# Patient Record
Sex: Male | Born: 1940 | Race: White | Hispanic: No | Marital: Married | Smoking: Never smoker
Health system: Southern US, Community
[De-identification: ages and names within clinical notes are randomized; demographics above are authoritative.]

## PROBLEM LIST (undated history)

## (undated) DIAGNOSIS — I2510 Atherosclerotic heart disease of native coronary artery without angina pectoris: Secondary | ICD-10-CM

## (undated) DIAGNOSIS — E785 Hyperlipidemia, unspecified: Secondary | ICD-10-CM

## (undated) DIAGNOSIS — N183 Chronic kidney disease, stage 3 unspecified: Secondary | ICD-10-CM

## (undated) DIAGNOSIS — I340 Nonrheumatic mitral (valve) insufficiency: Secondary | ICD-10-CM

## (undated) DIAGNOSIS — I351 Nonrheumatic aortic (valve) insufficiency: Secondary | ICD-10-CM

## (undated) DIAGNOSIS — J309 Allergic rhinitis, unspecified: Secondary | ICD-10-CM

## (undated) DIAGNOSIS — D126 Benign neoplasm of colon, unspecified: Secondary | ICD-10-CM

## (undated) DIAGNOSIS — N200 Calculus of kidney: Secondary | ICD-10-CM

## (undated) DIAGNOSIS — I341 Nonrheumatic mitral (valve) prolapse: Secondary | ICD-10-CM

## (undated) DIAGNOSIS — K219 Gastro-esophageal reflux disease without esophagitis: Secondary | ICD-10-CM

## (undated) DIAGNOSIS — M1990 Unspecified osteoarthritis, unspecified site: Secondary | ICD-10-CM

## (undated) DIAGNOSIS — N400 Enlarged prostate without lower urinary tract symptoms: Secondary | ICD-10-CM

## (undated) DIAGNOSIS — M722 Plantar fascial fibromatosis: Secondary | ICD-10-CM

## (undated) DIAGNOSIS — I10 Essential (primary) hypertension: Secondary | ICD-10-CM

## (undated) DIAGNOSIS — G709 Myoneural disorder, unspecified: Secondary | ICD-10-CM

## (undated) DIAGNOSIS — N210 Calculus in bladder: Secondary | ICD-10-CM

## (undated) DIAGNOSIS — M109 Gout, unspecified: Secondary | ICD-10-CM

## (undated) DIAGNOSIS — E039 Hypothyroidism, unspecified: Secondary | ICD-10-CM

## (undated) DIAGNOSIS — Z87442 Personal history of urinary calculi: Secondary | ICD-10-CM

## (undated) DIAGNOSIS — N529 Male erectile dysfunction, unspecified: Secondary | ICD-10-CM

## (undated) DIAGNOSIS — N289 Disorder of kidney and ureter, unspecified: Secondary | ICD-10-CM

## (undated) HISTORY — DX: Atherosclerotic heart disease of native coronary artery without angina pectoris: I25.10

## (undated) HISTORY — DX: Male erectile dysfunction, unspecified: N52.9

## (undated) HISTORY — PX: OTHER SURGICAL HISTORY: SHX169

## (undated) HISTORY — DX: Gastro-esophageal reflux disease without esophagitis: K21.9

## (undated) HISTORY — DX: Benign prostatic hyperplasia without lower urinary tract symptoms: N40.0

## (undated) HISTORY — PX: CORONARY ARTERY BYPASS GRAFT: SHX141

## (undated) HISTORY — DX: Essential (primary) hypertension: I10

## (undated) HISTORY — DX: Allergic rhinitis, unspecified: J30.9

## (undated) HISTORY — DX: Benign neoplasm of colon, unspecified: D12.6

## (undated) HISTORY — DX: Chronic kidney disease, stage 3 unspecified: N18.30

## (undated) HISTORY — DX: Hypothyroidism, unspecified: E03.9

## (undated) HISTORY — DX: Nonrheumatic mitral (valve) insufficiency: I34.0

## (undated) HISTORY — DX: Nonrheumatic aortic (valve) insufficiency: I35.1

## (undated) HISTORY — DX: Nonrheumatic mitral (valve) prolapse: I34.1

## (undated) HISTORY — DX: Hyperlipidemia, unspecified: E78.5

## (undated) HISTORY — DX: Plantar fascial fibromatosis: M72.2

---
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
## 1999-02-09 ENCOUNTER — Observation Stay (HOSPITAL_COMMUNITY): Admission: RE | Admit: 1999-02-09 | Discharge: 1999-02-10 | Payer: Self-pay | Admitting: Orthopedic Surgery

## 2004-08-10 ENCOUNTER — Inpatient Hospital Stay (HOSPITAL_COMMUNITY): Admission: AD | Admit: 2004-08-10 | Discharge: 2004-08-15 | Payer: Self-pay | Admitting: Cardiology

## 2004-09-04 ENCOUNTER — Encounter (HOSPITAL_COMMUNITY): Admission: RE | Admit: 2004-09-04 | Discharge: 2004-12-03 | Payer: Self-pay | Admitting: Cardiology

## 2004-12-04 ENCOUNTER — Encounter (HOSPITAL_COMMUNITY): Admission: RE | Admit: 2004-12-04 | Discharge: 2005-03-04 | Payer: Self-pay | Admitting: Cardiology

## 2005-09-28 ENCOUNTER — Ambulatory Visit (HOSPITAL_COMMUNITY): Admission: RE | Admit: 2005-09-28 | Discharge: 2005-09-28 | Payer: Self-pay | Admitting: Internal Medicine

## 2007-04-22 ENCOUNTER — Ambulatory Visit (HOSPITAL_COMMUNITY): Admission: RE | Admit: 2007-04-22 | Discharge: 2007-04-22 | Payer: Self-pay | Admitting: Cardiology

## 2008-02-26 ENCOUNTER — Ambulatory Visit: Payer: Self-pay | Admitting: Gastroenterology

## 2008-03-15 ENCOUNTER — Ambulatory Visit: Payer: Self-pay | Admitting: Gastroenterology

## 2011-03-01 LAB — HEMOCCULT GUIAC POC 1CARD (OFFICE)

## 2011-04-24 NOTE — H&P (Signed)
NAMEWILEY, MAGAN               ACCOUNT NO.:  [id number]   MEDICAL RECORD NO.:  [id number]            PATIENT TYPE:   LOCATION:                                 FACILITY:   PHYSICIAN:  Colleen Can. Deborah Chalk, M.D.    DATE OF BIRTH:   DATE OF ADMISSION:  04/22/2007  DATE OF DISCHARGE:                              HISTORY & PHYSICAL   CHIEF COMPLAINT:  None.   HISTORY OF PRESENT ILLNESS:  Mr. William Pierce is a very pleasant 70 year old  white male who has a known history of ischemic heart disease.  He was  seen after an approximate year and a half absence towards the middle  part of April.  At that time he had no complaints and he was referred  for a follow-up stress Cardiolite study.  Stress Cardiolite was  performed on Apr 18, 2007.  With this he exercised to the beginning of  stage IV.  His heart rate rose to approximately 146.  He did have PVCs  that became more frequent as well as subsequently having short runs of  ventricular tachycardia at the peak of exercise test.  The test was  actually stopped due to arrhythmia.  Electrocardiographically he had  inferior ST depression.  Clinically he remained asymptomatic.  He is now  referred for cardiac catheterization.   PAST MEDICAL HISTORY:  1. Known atherosclerotic cardiovascular disease.  He has had a      previous history of coronary artery bypass grafting x3 with left      internal mammary to the LAD, saphenous vein graft to the      intermediate, and saphenous vein graft to the posterior descending      on August 10, 2004, per Dr. Sheliah Plane.  2. Hyperlipidemia.  3. Erectile dysfunction.  4. GERD.  5. Plantar fasciitis  6. Positive family history of coronary disease.   ALLERGIES:  None.   CURRENT MEDICATIONS:  1. Toprol 50 mg b.i.d.  2. Zetia 10 mg a day.  3. Crestor 10 mg a day.  4. Multivitamin daily.  5. Fish oil daily.  6. Prilosec over-the-counter on Mondays, Wednesdays and Fridays.  7. Viagra p.r.n.   FAMILY  HISTORY:  Positive for coronary disease in both his father as  well as a stroke in his mother.   SOCIAL HISTORY:  He is married.  He is retired from [employer].  He has  no tobacco or alcohol.  He has four grown children.   REVIEW OF SYSTEMS:  As noted above.  He has had no complaints of chest  pain.  He has had no lightheadedness or dizziness.  He does note an  occasional skip in his heart on occasion.  He has not been playing  tennis due to plantar fasciitis as well as a heel spur.  His weight has  increased over the course the past year and a half.  He has not had  shortness of breath.   PHYSICAL EXAMINATION:  GENERAL:  On exam, he is a pleasant white male  who appears younger than his stated age.  VITAL  SIGNS:  Weight is 182-1/2, blood pressure 126/78 sitting, 110/70  standing, heart rate 60 and regular, respirations 18.  He is afebrile.  SKIN:  Warm and dry.  Color is unremarkable.  LUNGS: Clear.  HEART:  Regular rhythm.  ABDOMEN:  Soft.  EXTREMITIES:  Without edema.  NEUROLOGIC:  No gross focal deficits.   Pertinent labs are pending.   OVERALL IMPRESSION:  1. Abnormal stress Cardiolite study as evidenced by EKG changes and      runs of ventricular tachycardia.  2. Known ischemic heart disease with previous coronary artery bypass      grafting in 2005.  3. Hyperlipidemia.   PLAN:  We will proceed on with cardiac catheterization.  The procedure  risks and benefits have all been reviewed and he is willing to proceed  on Tuesday Apr 22, 2007.      Sharlee Blew, N.P.      Colleen Can. Deborah Chalk, M.D.  Electronically Signed    LC/MEDQ  D:  04/18/2007  T:  04/18/2007  Job:  [id number]

## 2011-04-24 NOTE — Cardiovascular Report (Signed)
NAMEWILFRID, William Pierce              ACCOUNT NO.:  [id number]   MEDICAL RECORD NO.:  [id number]          PATIENT TYPE:  OIB   LOCATION:  2899                         FACILITY:  [facility]   PHYSICIAN:  Colleen Can. Deborah Chalk, M.D.DATE OF BIRTH:  March 24, 1941   DATE OF PROCEDURE:  04/22/2007  DATE OF DISCHARGE:                            CARDIAC CATHETERIZATION   PROCEDURE:  Left heart catheterization with selective coronary  angiography, left ventricular angiography, saphenous vein graft  angiography x2 and angiography of left internal mammary artery.   TYPE AND SITE:  __________  percutaneous right femoral artery __________  catheter; 6-French 4 curved Judkins right and left coronary catheters, a  6-French pigtail ventriculographic catheter, a left coronary artery  bypass graft catheter, left internal mammary catheter.   CONTRAST MATERIAL:  Omnipaque.   MEDICATIONS GIVEN PRIOR TO THE PROCEDURE:  Valium 10 mg p.o.   MEDICATIONS GIVEN DURING THE PROCEDURE:  Versed 3 mg IV.   COMMENTS:  The patient tolerated the procedure well.   HEMODYNAMIC DATA:  The aortic pressure was 105/70.  LV was 123/2-6.  There was no aortic valve gradient noted on pullback.   ANGIOGRAPHIC DATA:  Left ventricular angiogram was performed in the RAO  position.  Overall cardiac size and silhouette were normal.  There was  mild anterior hypokinesis.  The global ejection fraction was estimated  at 55% with very minimal anterior hypokinesia.  1. Left main coronary artery had 60-70% ostial narrowing.  2. The left circumflex is a moderate-sized vessel.  It had 30%      proximal narrowing.  3. Left anterior descending:  The left anterior descending had 60-70%      narrowing before the insertion of the left internal mammary graft.      There is bidirectional flow distally.  The second diagonal vessel      was a relatively large vessel and satisfactory.  4. Right coronary artery:  The right coronary artery had a 40% area  in      the mid portion and a 60-70% lesion sequential to that in the mid      portion before the crux.  There is bidirectional flow distally.  5. Left internal mammary graft to the LAD was widely patent with nice      insertion and good distal runoff.  6. Saphenous vein graft to the right coronary artery distally had a      nice insertion and good distal runoff.  7. Saphenous vein graft to the intermediate was a satisfactory graft.      No bidirectional flow was noted into this vessel.   OVERALL IMPRESSION:  1. Well-preserved left ventricular function with very minimal anterior      hypokinesis.  2. Three-vessel coronary atherosclerosis with 60-70% right coronary      artery lesion, 60-70% mid left anterior descending, 60-70% ostial      left anterior descending disease and minimal disease in the left      circumflex.  3. Patent left internal mammary artery graft to the left anterior      descending, patent saphenous vein graft to the right  coronary      artery, patent saphenous vein graft to the intermediate coronary.   DISCUSSION:  In light of these findings, we will place [name] on  beta blockers and in hopes to manage this exercise-induced ventricular  tachycardia.  It is felt that he is satisfactorily revascularized.      Colleen Can. Deborah Chalk, M.D.     SNT/MEDQ  D:  04/22/2007  T:  04/22/2007  Job:  [id number]

## 2011-04-27 NOTE — Cardiovascular Report (Signed)
NAMEJAVARUS, Pierce                        ACCOUNT NO.:  [id number]   MEDICAL RECORD NO.:  [id number]                   PATIENT TYPE:  OIB   LOCATION:  2872                                 FACILITY:  [facility]   PHYSICIAN:  Colleen Can. Deborah Chalk, M.D.            DATE OF BIRTH:  [birth year]   DATE OF PROCEDURE:  08/09/2004  DATE OF DISCHARGE:                              CARDIAC CATHETERIZATION   REFERRING PHYSICIAN:  Larina Earthly, M.D.   PROCEDURE:  Left heart catheterization with selective angiography and left  ventricular angiography.   TYPE AND SITE:  Percutaneous right femoral artery with Angiocele.   CATHETER:  A 6 French curved Judkins right and left coronary catheter, a 6  French pigtail ventricular catheter.   CONTRAST MATERIAL:  Omnipaque.   MEDICATION GIVEN PRIOR TO PROCEDURE:  Valium 10 mg p.o.   MEDICATION GIVEN DURING THE PROCEDURE:  Versed 2 mg IV.   COMMENTS:  The patient tolerated the procedure well.  Nitroglycerin was  given after pictures of the left main coronary artery because of the concern  of spasm but it is felt that the left main stenosis was indeed real.   Hemodynamic data:  Aortic pressure 105/85, LV 123/2-6.  There is no aortic  valve gradient noted on pullback.   Angiographic data:  1.  Left main coronary artery has 80% ostial narrowing.  It is approximately      10 mm in length.  2.  Left anterior descending:  Left anterior descending coronary artery has      a 40 to 50% narrowing in his proximal portions.  There was a moderate      size diagonal vessel with irregularities.  There was no significant      obstructive disease in the left anterior descending.  3.  Intermediate coronary:  There is a moderate sized intermediate coronary      with mild irregularities proximally.  There is no significant      atherosclerosis.  4.  Left circumflex:  Left circumflex is a small to moderate size vessel.      There are four small to moderate size distal  branches. There is a 50 to      60% narrowing in the proximal left circumflex prior to dividing into      __________ branches.  5.  Right coronary artery:  The right coronary artery is a relatively large      dominant vessel.  There are scattered irregularities throughout the      proximal one half and up to the carotids.  There is a 70% focal      narrowing at the mid portion at the level of the large acute marginal      vessel.  The acute marginal does have an ostial stenosis.   Left ventricular angiogram:  The left ventricular angiogram is performed in  the RAO position.  Overall cardiac size was normal.  The  global ejection  fraction is estimated to be approximately 50%.  Regional wall motion is felt  to probably be within normal limits, although there may be some mild  anterior hypokinesis.   IMPRESSION:  1.  Essentially normal left ventricular function .  2.  Severe left main stenosis (80% ostial) and moderately severe (70%) right      coronary artery with mild to moderate scattered atherosclerosis in the      left system otherwise.   DISCUSSION:  In light of these findings, we will arrange for William Pierce to  proceed on with coronary artery bypass grafting.                                               Colleen Can. Deborah Chalk, M.D.    SNT/MEDQ  D:  08/09/2004  T:  08/10/2004  Job:  [id number]   cc:   Larina Earthly, M.D.  [address]  [place]  [place] [address]  Fax: [phone]

## 2011-04-27 NOTE — Consult Note (Signed)
William Pierce, William Pierce                        ACCOUNT NO.:  [id number]   MEDICAL RECORD NO.:  [id number]                   PATIENT TYPE:  OIB   LOCATION:  2872                                 FACILITY:  [facility]   PHYSICIAN:  Gwenith Daily. Tyrone Sage, M.D.            DATE OF BIRTH:  May 29, 1941   DATE OF CONSULTATION:  08/09/2004  DATE OF DISCHARGE:                                   CONSULTATION   REFERRING PHYSICIAN:  Colleen Can. Deborah Chalk, M.D.   FOLLOW-UP CARDIOLOGIST:  Colleen Can. Deborah Chalk, M.D.   PRIMARY CARE PHYSICIAN:  Larina Earthly, M.D.   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   HISTORY OF PRESENT ILLNESS:  The patient is a 70 year old male who has been  healthy and active with no previous cardiac history noted, while playing  tennis, increasing shortness of breath according to what he had been used to  in the past and some vague chest discomfort.  He has also had several  episodes of left shoulder pain which were unrelated to exertion.  He noted  these symptoms and discussed them with his primary care doctor, who ordered  an exercise stress test.  The patient walked nine minutes and had 3 mm ST  inferolateral depression and increased ectopy.  ST segments lasted up until  10 minutes into recovery.  Because of the abnormal stress test he was  referred to Dr. Deborah Chalk for a cardiac catheterization.  He has had no prior  myocardial infarction, no prior angioplasty.  He does have hypertension,  does have hyperlipidemia.  He denies diabetes, never smoked.  He has history  of cerebrovascular disease and cardiac disease, runs in his family.  Both  his father and his mother died in their 3s, his father from MI and his  mother from a stroke.  The patient denies any previous stroke, denies  peripheral vascular disease, denies renal insufficiency.  He has no other  major medical problems.   PAST SURGICAL HISTORY:  Esophageal dilatation for Schatzki's  ring.   SOCIAL HISTORY:  The patient is married, works as a Public librarian.  He has  occasional alcohol use.   MEDICATIONS ON ADMISSION:  1.  Fish oil one tablet b.i.d.  2.  Baby aspirin once a day, 81 mg.  3.  Glucosamine.  4.  Altace.  5.  Zetia.  6.  Prevacid.  7.  Recently added Toprol XL.   ALLERGIES:  Drug allergies:  None known.   REVIEW OF SYSTEMS:  CARDIAC:  As noted above.  Chest discomfort and  shortness of breath while playing tennis and increasing fatigue.  He has had  shoulder pain at night, denies resting shortness of breath, denies syncope,  presyncope, orthopnea, palpitations, or lower extremity edema.  GENERAL:  He  denies any constitutional symptoms.  No fever or chills.  RESPIRATORY:  As  noted above.  Denies hemoptysis.  GASTROINTESTINAL:  Has had symptoms of  GERD in the past leading to dilatation of a Schatzki's ring.  NEUROLOGIC:  Denies TIAs or amaurosis.  The patient is right-handed.  MUSCULOSKELETAL:  Denies any current joint pain.  GENITOURINARY:  Denies any urinary  difficulties, does occasionally use Viagra.  HEMATOLOGIC:  No easy  bruisability.  ENDOCRINE:  Denies diabetes.  Denies psychiatric history.  Denies claudication.  Other review of systems negative.   PHYSICAL EXAMINATION:  VITAL SIGNS:  Reveals a blood pressure of 121/79,  pulse 56 and regular, respiratory rate is 18.  O2 saturations on room air  100%.  Height 5 feet 10-1/2 inches tall, weighs 170 pounds.  GENERAL:  The patient is healthy-appearing and neurologically intact and  able to relate his history.  HEENT:  Pupils equal, round, and reactive to light.  NECK:  Without carotid bruits.  LUNGS:  Clear.  CARDIAC:  Reveals regular rate and rhythm without murmur or gallop.  ABDOMEN:  Benign right groin.  Without hematoma.  NEUROLOGIC:  Grossly intact.  Appears to have adequate vein in both lower  extremities for bypass.  EXTREMITIES:  Vascular exam reveals 2+ DP and PT pulses  bilaterally.   LABORATORY DATA:  Findings include a white count of 7400, hematocrit of  44.7, platelet count 292.  Potassium is 4.7, creatinine 1.3.  Ejection  fraction is 50%.  He has osteal 80-90% left main, LAD 40% proximal, with an  intermediate, relatively free of disease.  Circumflex is made up of the  small branch with four distal branches with proximal 50% like coronary  arteries.  Moderate size with diffuse irregularity and approximately mid  70%.   IMPRESSION:  A patient with positive stress test and left main obstruction  and right coronary disease.  With the patient's left main obstruction  greater than 80%, and positive stress test, we agree with the recommendation  from cardiology to proceed with coronary artery bypass grafting.  The risks  and options were discussed with the patient in detail including the risks or  death, infection, stroke, myocardial infarction, bleeding, or blood  transfusion were all discussed with the patient in detail.  He is willing to  proceed.  We will keep the patient overnight and proceed with surgery  tomorrow.                                               Gwenith Daily Tyrone Sage, M.D.    Tyson Babinski  D:  08/09/2004  T:  08/09/2004  Job:  [id number]   cc:   Gwenith Daily. Tyrone Sage, M.D.  [address]  [place]  [place] [address]   Colleen Can. Deborah Chalk, M.D.  Fax: [phone]

## 2011-04-27 NOTE — Op Note (Signed)
William Pierce, William Pierce                        ACCOUNT NO.:  [id number]   MEDICAL RECORD NO.:  [id number]                   PATIENT TYPE:  INP   LOCATION:  2027                                 FACILITY:  [facility]   PHYSICIAN:  Gwenith Daily. Tyrone Sage, M.D.            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   SURGICAL PROCEDURE:  Coronary artery bypass grafting x3 with left internal  mammary to the left anterior descending coronary artery, reverse saphenous  vein graft to the intermediate coronary artery, reverse saphenous vein graft  to the posterior descending coronary artery with endo-vein harvesting.   SURGEON:  Gwenith Daily. Tyrone Sage, M.D.   FIRST ASSISTANT:  Carolyn A. Eustaquio Boyden.   BRIEF HISTORY:  Patient is a 70 year old male who had new onset of vague  chest discomfort with exertion.  Because of these symptoms, a stress test  was performed, which was positive, leading to a cardiac catheterization by  Dr. Delfin Edis.  This revealed greater than 80% ostial left main  obstruction, 50-60% proximal LAD obstruction, a small circumflex system with  four small distal branches, a moderate-sized intermediate and a 60% distal  right coronary artery obstruction.  Because of the patient's significant  left main disease, coronary artery bypass grafting was recommended, and the  patient agreed and signed informed consent.   DESCRIPTION OF PROCEDURE:  With Swan-Ganz arterial line and monitors in  place, the patient underwent general endotracheal anesthesia without  incidence.  The skin of the chest and legs was prepped with Betadine and  draped in the usual sterile manner.  Initially, a small incision was made  over the left knee, and the  saphenous vein was small and not usable.  In a  similar fashion, a small incision was made over the right knee.  The endo-  vein was suitable.  The vein at the right knee was suitable, and using the  Guidant endo-vein harvesting system, a segment of vein was harvested and was  of good quality and caliber.  A median sternotomy was performed.  The left  internal mammary artery was dissected down as a pedicle graft.  The distal  artery was divided and had good free flow.  The pericardium was opened.  Overall ventricular function appeared preserved.  The patient was  systemically heparinized.  The ascending aorta and the right atrium were  cannulated.  The aortic root had undergone cardioplegia, and it was  introduced into the ascending aorta.  The patient was placed on  cardiopulmonary bypass at 2.4 L/min/m2.  Sites for anastomosis were selected  and dissected out of the epicardium.  The patient's body temperature was  cooled to 30 degrees.  The aortic cross clamp was __________  500 cc of cold  blood potassium cardioplegia was administered with rapid diastolic  __________  cross clamp.  Attention was turned first to the circumflex  branches.  These were very small and were not felt to be large enough to  bypass as they arose from the AV groove.  The intermediate coronary artery  was a suitable size and was opened and underwent a 1.5 mm probe.  The vessel  was partially intramyocardial.  Using a running 7-0 Prolene, distal  anastomosis was performed.  Attention was then turned to the posterior  descending coronary artery, which was opened and admitted a 1.5 mm probe.  Using a running 7-0 Prolene, distal anastomosis was then performed.  Attention was then turned to the left anterior descending coronary artery,  which was opened in the distal third of the vessel.  Proximal 2/3 was  entered myocardial.  Using a running 8-0 Prolene, the left internal mammary  artery was anastomosed to the left  anterior descending artery with release  of the [employer] with prompt rise in myocardial septal temperature.  The bulldog was placed back on the mammary artery.  With the cross clamp  still in place, two punch aortotomies were performed, each of the two vein  graft anastomoses to the ascending aorta.  Air was evacuated from the heart,  and the grafts completed.  A bulldog was removed with prompt rise in  myocardial septal temperature.  Aortic cross clamp was removed.  Total cross  clamp time was 60 minutes.  The patient's body temperature was rewarmed to  37 degrees.  Remained hemodynamically stable.  Was decannulated in the usual  fashion.  Protamine sulfate was administered within the operative field.  The total pump time was 87 minutes.  Cross clamp time was 60 minutes.  Within the operative field, hemostatic __________ .  A left pleural tube and  two mediastinal tubes were left in place.  The sternum was closed with a #6  stainless steel wire.  Fascia was closed with interrupted 0 Vicryl, a  running 3-0 Vicryl and subcutaneous tissue 4-0 subcuticular stitching skin  edges.  A dry sterile dressing was applied.  Sponge and needle count was  reported as correct at the completion of the procedure.  Patient tolerated  the procedure without obvious complication.  Was transferred to the surgical  intensive care unit for further postoperative care.                                               Gwenith Daily Tyrone Sage, M.D.    EBG/MEDQ  D:  08/15/2004  T:  08/15/2004  Job:  [id number]   cc:   Colleen Can. Deborah Chalk, M.D.  Fax: [phone]

## 2011-04-27 NOTE — Discharge Summary (Signed)
William Pierce, William Pierce              ACCOUNT NO.:  [id number]   MEDICAL RECORD NO.:  [id number]          PATIENT TYPE:  INP   LOCATION:  2027                         FACILITY:  [facility]   PHYSICIAN:  Sheliah Plane, MD    DATE OF BIRTH:  1941-10-22   DATE OF ADMISSION:  08/09/2004  DATE OF DISCHARGE:  08/15/2004                                 DISCHARGE SUMMARY   ADMISSION DIAGNOSIS:  Exertional chest pain.   PAST MEDICAL HISTORY/DISCHARGE DIAGNOSES:  1.  Hypertension.  2.  Hyperlipidemia.  3.  Esophageal dilation for Schatski's ring.  4.  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  5.  Postoperative premature ventricular contractions and ventricular      trigeminy.   ALLERGIES:  No known drug allergies.   BRIEF HISTORY:  The patient is a 70 year old male who had been healthy and  active with no previous cardiac history noted.  However, while playing  tennis a short while before being admitted, the patient noted increasing  shortness of breath greater than what he had been accustomed to in the past.  This was accompanied by some vague chest discomfort.  He also had several  episodes of left shoulder pain which were unrelated to exertion.  He noted  these symptoms and discussed these with his primary care doctor who ordered  an exercise stress test.  The patient walked 9 minutes and had a 3-mm ST  inferolateral depression and increased ectopy.  The ST segments lasted up to  10 minutes into recovery.  Secondary to the abnormal stress test, he was  referred to Dr. Deborah Chalk for cardiac catheterization.  The patient has had no  previous myocardial infarction or prior angioplasty.  Cardiac cath revealed  three-vessel coronary artery disease with left main obstruction.  Secondary  to these results, Dr. Tyrone Sage of [facility] was consulted regarding surgical  revascularization.  Dr. Tyrone Sage evaluated the patient on date of  admission  which was August 09, 2004.  It was his opinion that the patient should  undergo coronary artery bypass grafting.   HOSPITAL COURSE:  The patient was admitted and taken to cardiac  catheterization by Dr. Deborah Chalk on August 09, 2004.  This revealed three-  vessel coronary artery disease with left main obstruction.  Dr. Tyrone Sage  evaluated the patient and recommended surgical revascularization.  The  patient was subsequently scheduled at 4 and taken to the OR on August 10, 2004 for coronary artery bypass grafting x3.  Left internal mammary artery  was grafted to the LAD, saphenous vein was grafted to the right coronary  artery, and saphenous vein was grafted to the intermediate.  Endoscopic  vessel harvesting was performed on the left leg.  The patient tolerated the  procedure well and was hemodynamically stable immediately postoperatively.  The patient was transferred from the OR to the SICU in stable condition.  The patient was extubated without complication and neurologically intact.   On postoperative day #1, the patient was afebrile with stable vital signs.  He was maintaining normal  sinus rhythm.  The patient's drips were weaned and  his chest tubes were discontinued in a routine fashion.  The patient was in  stable condition and doing well.   The patient began rehab on postoperative day #1 and tolerated this well.  He  ambulated 300 feet with assistance x2.   The remainder of the patient's postoperative course progressed as expected.  On postoperative day #2, he was noted to have a mild postoperative anemia.  The patient was volume overloaded postoperatively and was diuresed  accordingly.  The patient was transferred from the SICU to unit 2000 on  postoperative day #2 without complication.   On postoperative day #4, the patient was afebrile and vital signs were  stable.  However, telemetry revealed that the patient was maintaining normal  sinus rhythm with occasional  PVCs and ventricular trigeminy.  On physical  exam, cardiac was regular rate and rhythm and the lungs were clear to  auscultation bilaterally.  The patient's Lopressor was increasing for the  PVCs and ventricular trigeminy.  The patient was ambulated and monitored.  There were no runs of V tach.   On postoperative day #5, the patient was without complaint.  He was afebrile  with blood pressure 137/86, heart rate 62, and he was still maintaining  normal sinus rhythm with occasional PVCs.  On physical exam, cardiac was  regular rate and rhythm, lungs were clear to auscultation bilaterally.  The  abdomen was soft with active bowel sounds.  The incisions were healing well  and there was no edema present in the bilateral lower extremities.  The  patient was in stable condition and was ready for discharge home. The  patient had continued to work with cardiac rehab postoperatively and  continued to tolerate this very well.   CONDITION ON DISCHARGE:  Improved.   LABORATORY DATA:  CBC on August 12, 2004; hemoglobin 9.8, hematocrit 28.5,  platelet count 132.  BMP on August 15, 2004; sodium 141, potassium 2.9,  BUN 16, creatinine 1.1, glucose 124, calcium 8.7, magnesium 1.9.   INSTRUCTIONS:   MEDICATIONS:  1.  Aspirin 325 mg p.o. daily.  2.  Toprol XL 50 mg b.i.d.  3.  Zetia 10 mg daily.  4.  Home Protonix.  5.  Tylox 1-2 p.o. q.4-6h p.r.n. pain.   ACTIVITY:  No driving, no lifting of more than 10 pounds.  The patient will  continue daily walk exercises.   DIET:  Low salt, low fat.   WOUND CARE:  The patient was instructed to shower daily and clean his  incisions with soap and water.  If wound problems arose, the patient was to  contact [facility] office.   Followup appointment with Dr. Deborah Chalk or Reyes Ivan two weeks after discharge.  The patient was to call and arrange that appointment.  The patient was to have a chest x-ray taken by cardiologist at that appointment.  Follow with  Dr.  Tyrone Sage in three weeks after discharge.  [facility] office will contact the  patient with that appointment.  The patient was instructed to bring chest x-  ray from the cardiologist to appointment with Dr. Tyrone Sage.      Aman   AY/MEDQ  D:  09/13/2004  T:  09/13/2004  Job:  [id number]

## 2011-07-04 ENCOUNTER — Encounter: Payer: Self-pay | Admitting: Cardiovascular Disease

## 2011-07-04 ENCOUNTER — Encounter: Payer: Self-pay | Admitting: *Deleted

## 2011-07-05 ENCOUNTER — Ambulatory Visit (INDEPENDENT_AMBULATORY_CARE_PROVIDER_SITE_OTHER): Payer: Medicare Other | Admitting: Cardiovascular Disease

## 2011-07-05 ENCOUNTER — Encounter: Payer: Self-pay | Admitting: Cardiovascular Disease

## 2011-07-05 VITALS — BP 132/74 | HR 54 | Resp 14 | Ht 70.0 in | Wt 182.0 lb

## 2011-07-05 DIAGNOSIS — I10 Essential (primary) hypertension: Secondary | ICD-10-CM

## 2011-07-05 DIAGNOSIS — I2510 Atherosclerotic heart disease of native coronary artery without angina pectoris: Secondary | ICD-10-CM

## 2011-07-05 NOTE — Assessment & Plan Note (Signed)
Stable. Continue current therapy. NO changes.

## 2011-07-05 NOTE — Progress Notes (Signed)
History of Present Illness:70 yo WM with history of CAD s/p CABG, HLD, GERD, ED here today cardiac follow up. He has been followed in the past by Dr. Deborah Chalk. HIs CABG was 08/10/04 per Dr. Loistine Chance.  Last cath 2008 with patent SVG to intermediate, SVG to RCA and LIMA to LAD. LVEF was 55% by cath in 2008.   He tells me that he is doing well. He plays Tennis and golf. He exercises frequently. No chest pain, SOB, palpitations, near syncope or syncope.   His primary care is Dr. Felipa Eth at [facility].   Last lipids 02/26/11: Total chol: 126  HDL 40  LDL 70  TG 78  Past Medical History  Diagnosis Date  . Cardiovascular disease   . Hyperlipemia   . GERD (gastroesophageal reflux disease)   . Erectile dysfunction   . Plantar fasciitis   . Coronary artery disease   . Hypertension   . Benign prostatic hypertrophy     Past Surgical History  Procedure Date  . Coronary artery bypass graft LIMA to LAD, SVG to Intermediate, SVG to PDA  . Hernia surgery x 2     Current Outpatient Prescriptions  Medication Sig Dispense Refill  . aspirin 81 MG EC tablet Take 81 mg by mouth daily.        . CRESTOR 10 MG tablet Take 1 tablet by mouth Daily.      . metoprolol (LOPRESSOR) 50 MG tablet 1/2 tab po bid      . ZETIA 10 MG tablet Take 1 tablet by mouth Daily.      Marland Kitchen SYNTHROID 50 MCG tablet Take 1 tablet by mouth Daily.        Allergies not on file  History   Social History  . Marital Status: Married    Spouse Name: N/A    Number of Children: N/A  . Years of Education: N/A   Occupational History  . Not on file.   Social History Main Topics  . Smoking status: Not on file  . Smokeless tobacco: Not on file  . Alcohol Use: Not on file  . Drug Use: Not on file  . Sexually Active: Not on file   Other Topics Concern  . Not on file   Social History Narrative    Positive for coronary disease in both his father as  well as a stroke in his mother.       Family History  Problem Relation Age of Onset  .  Coronary artery disease      Review of Systems:  As stated in the HPI and otherwise negative.   BP 132/74  Pulse 54  Resp 14  Ht 5\' 10"  (1.778 m)  Wt 182 lb (82.555 kg)  BMI 26.11 kg/m2  Physical Examination: General: Well developed, well nourished, NAD HEENT: OP clear, mucus membranes moist SKIN: warm, dry. No rashes. Neuro: No focal deficits Musculoskeletal: Muscle strength 5/5 all ext Psychiatric: Mood and affect normal Neck: No JVD, no carotid bruits, no thyromegaly, no lymphadenopathy. Lungs:Clear bilaterally, no wheezes, rhonci, crackles Cardiovascular: Regular rate and rhythm. No murmurs, gallops or rubs. Abdomen:Soft. Bowel sounds present. Non-tender.  Extremities: No lower extremity edema. Pulses are 2 + in the bilateral DP/PT.  ZOX:WRUEA brady with rate of 54 bpm. 1st degree AV block.

## 2011-07-05 NOTE — Assessment & Plan Note (Signed)
Well controlled 

## 2011-07-05 NOTE — Patient Instructions (Signed)
Your physician recommends that you schedule a follow-up appointment in: 6 months  

## 2011-07-23 ENCOUNTER — Encounter: Payer: Self-pay | Admitting: Cardiovascular Disease

## 2011-12-27 ENCOUNTER — Ambulatory Visit (INDEPENDENT_AMBULATORY_CARE_PROVIDER_SITE_OTHER): Payer: Medicare Other | Admitting: Cardiovascular Disease

## 2011-12-27 ENCOUNTER — Encounter: Payer: Self-pay | Admitting: Cardiovascular Disease

## 2011-12-27 VITALS — BP 132/74 | HR 51 | Ht 70.0 in | Wt 184.0 lb

## 2011-12-27 DIAGNOSIS — I2510 Atherosclerotic heart disease of native coronary artery without angina pectoris: Secondary | ICD-10-CM

## 2011-12-27 NOTE — Patient Instructions (Signed)
Your physician wants you to follow-up in: 12 months.  You will receive a reminder letter in the mail two months in advance. If you don't receive a letter, please call our office to schedule the follow-up appointment.  Your physician recommends that you continue on your current medications as directed. Please refer to the Current Medication list given to you today.   

## 2011-12-27 NOTE — Assessment & Plan Note (Signed)
Stable. He is on good medical therapy. No changes at this time.

## 2011-12-27 NOTE — Progress Notes (Signed)
History of Present Illness: 71 yo WM with history of CAD s/p CABG, HLD, GERD, ED here today cardiac follow up. He has been followed in the past by Dr. Deborah Chalk. HIs CABG was 08/10/04 per Dr. Loistine Chance. Last cath 2008 with patent SVG to intermediate, SVG to RCA and LIMA to LAD. LVEF was 55% by cath in 2008.  He tells me that he is doing well. He plays Tennis and golf. He exercises frequently. No chest pain, SOB, palpitations, near syncope or syncope. He does note an odd awareness of a heavy beat in his chest, not painful.   His primary care is Dr. Felipa Eth at [facility].   Last lipids 02/26/11: Total chol: 126 HDL 40 LDL 70 TG 78   Past Medical History  Diagnosis Date  . Cardiovascular disease   . Hyperlipemia   . GERD (gastroesophageal reflux disease)   . Erectile dysfunction   . Plantar fasciitis   . Coronary artery disease   . Hypertension   . Benign prostatic hypertrophy     Past Surgical History  Procedure Date  . Coronary artery bypass graft LIMA to LAD, SVG to Intermediate, SVG to PDA  . Hernia surgery x 2     Current Outpatient Prescriptions  Medication Sig Dispense Refill  . aspirin 81 MG EC tablet Take 81 mg by mouth daily.        . CRESTOR 10 MG tablet Take 1 tablet by mouth Daily.      . fish oil-omega-3 fatty acids 1000 MG capsule Take 2 g by mouth daily.      Marland Kitchen glucosamine-chondroitin 500-400 MG tablet Take 1 tablet by mouth 2 (two) times daily before a meal.      . metoprolol (LOPRESSOR) 50 MG tablet 1/2 tab po bid      . omeprazole (PRILOSEC) 20 MG capsule 20 mg. 1 tab three times a week      . SYNTHROID 50 MCG tablet Take 1 tablet by mouth Daily.      Marland Kitchen ZETIA 10 MG tablet Take 1 tablet by mouth Daily.        No Known Allergies  History   Social History  . Marital Status: Married    Spouse Name: N/A    Number of Children: N/A  . Years of Education: N/A   Occupational History  . Retired-banker    Social History Main Topics  . Smoking status: Never Smoker   .  Smokeless tobacco: Not on file  . Alcohol Use: Not on file  . Drug Use: Not on file  . Sexually Active: Not on file   Other Topics Concern  . Not on file   Social History Narrative    Positive for coronary disease in both his father as  well as a stroke in his mother.       Family History  Problem Relation Age of Onset  . Coronary artery disease    . Heart attack Father     Review of Systems:  As stated in the HPI and otherwise negative.   BP 132/74  Pulse 51  Ht 5\' 10"  (1.778 m)  Wt 184 lb (83.462 kg)  BMI 26.40 kg/m2  Physical Examination: General: Well developed, well nourished, NAD HEENT: OP clear, mucus membranes moist SKIN: warm, dry. No rashes. Neuro: No focal deficits Musculoskeletal: Muscle strength 5/5 all ext Psychiatric: Mood and affect normal Neck: No JVD, no carotid bruits, no thyromegaly, no lymphadenopathy. Lungs:Clear bilaterally, no wheezes, rhonci, crackles Cardiovascular: Regular rate  and rhythm. No murmurs, gallops or rubs. Abdomen:Soft. Bowel sounds present. Non-tender.  Extremities: No lower extremity edema. Pulses are 2 + in the bilateral DP/PT.

## 2012-03-27 ENCOUNTER — Encounter: Payer: Self-pay | Admitting: Internal Medicine

## 2013-01-01 ENCOUNTER — Ambulatory Visit (INDEPENDENT_AMBULATORY_CARE_PROVIDER_SITE_OTHER): Payer: Medicare Other | Admitting: Cardiovascular Disease

## 2013-01-01 ENCOUNTER — Encounter: Payer: Self-pay | Admitting: Cardiovascular Disease

## 2013-01-01 VITALS — BP 118/68 | HR 58 | Ht 70.0 in | Wt 181.4 lb

## 2013-01-01 DIAGNOSIS — I2510 Atherosclerotic heart disease of native coronary artery without angina pectoris: Secondary | ICD-10-CM

## 2013-01-01 DIAGNOSIS — Z01810 Encounter for preprocedural cardiovascular examination: Secondary | ICD-10-CM

## 2013-01-01 NOTE — Progress Notes (Signed)
History of Present Illness: 72 yo WM with history of CAD s/p CABG, HLD, GERD, ED here today cardiac follow up. He has been followed in the past by Dr. Deborah Chalk. HIs CABG was 08/10/04 per Dr. Loistine Chance. Last cath 2008 with patent SVG to intermediate, SVG to RCA and LIMA to LAD. LVEF was 55% by cath in 2008.   He tells me that he is doing well. He plays Tennis and golf. He exercises frequently. No chest pain, SOB, palpitations, near syncope or syncope. He has plans for a left hip replacement per Dr. Charlann Boxer.   Primary Care Physician: Dr. Felipa Eth  Last Lipid Profile: Followed in primary care.   Past Medical History  Diagnosis Date  . Cardiovascular disease   . Hyperlipemia   . GERD (gastroesophageal reflux disease)   . Erectile dysfunction   . Plantar fasciitis   . Coronary artery disease   . Hypertension   . Benign prostatic hypertrophy     Past Surgical History  Procedure Date  . Coronary artery bypass graft LIMA to LAD, SVG to Intermediate, SVG to PDA  . Hernia surgery x 2     Current Outpatient Prescriptions  Medication Sig Dispense Refill  . aspirin 81 MG EC tablet Take 81 mg by mouth daily.        . CRESTOR 10 MG tablet Take 1 tablet by mouth Daily.      . fish oil-omega-3 fatty acids 1000 MG capsule Take 2 g by mouth daily.      . metoprolol (LOPRESSOR) 50 MG tablet 1/2 tab po bid      . omeprazole (PRILOSEC) 20 MG capsule 20 mg. 1 tab three times a week      . SYNTHROID 50 MCG tablet Take 1 tablet by mouth Daily.      Marland Kitchen ZETIA 10 MG tablet Take 1 tablet by mouth Daily.      Marland Kitchen allopurinol (ZYLOPRIM) 100 MG tablet         No Known Allergies  History   Social History  . Marital Status: Married    Spouse Name: N/A    Number of Children: N/A  . Years of Education: N/A   Occupational History  . Retired-banker    Social History Main Topics  . Smoking status: Never Smoker   . Smokeless tobacco: Not on file  . Alcohol Use: Not on file  . Drug Use: Not on file  .  Sexually Active: Not on file   Other Topics Concern  . Not on file   Social History Narrative    Positive for coronary disease in both his father as  well as a stroke in his mother.       Family History  Problem Relation Age of Onset  . Coronary artery disease    . Heart attack Father     Review of Systems:  As stated in the HPI and otherwise negative.   BP 118/68  Pulse 58  Ht 5\' 10"  (1.778 m)  Wt 181 lb 6.4 oz (82.283 kg)  BMI 26.03 kg/m2  SpO2 98%  Physical Examination: General: Well developed, well nourished, NAD HEENT: OP clear, mucus membranes moist SKIN: warm, dry. No rashes. Neuro: No focal deficits Musculoskeletal: Muscle strength 5/5 all ext Psychiatric: Mood and affect normal Neck: No JVD, no carotid bruits, no thyromegaly, no lymphadenopathy. Lungs:Clear bilaterally, no wheezes, rhonci, crackles Cardiovascular: Regular rate and rhythm. No murmurs, gallops or rubs. Abdomen:Soft. Bowel sounds present. Non-tender.  Extremities: No lower extremity  edema. Pulses are 2 + in the bilateral DP/PT.  EKG: Sinus bradycardia, rate 55 bpm. 1st degree AV block. LVH. Non-specific T wave abnormality.   Assessment and Plan:   1. CAD: Stable. Will continue current therapy with ASA/statin/beta blocker  2. Pre-operative cardiovascular examination: He has no signs or symptoms of angina, CHF or arrythmias. Based on current guidelines, he does not need an ischemic evaluation prior to his planned surgical procedure. No further cardiac testing at this time. Will repeat echo in one year.

## 2013-01-19 ENCOUNTER — Encounter (HOSPITAL_COMMUNITY): Payer: Self-pay | Admitting: Pharmacy Technician

## 2013-01-21 ENCOUNTER — Other Ambulatory Visit (HOSPITAL_COMMUNITY): Payer: Self-pay | Admitting: Orthopedic Surgery

## 2013-01-21 ENCOUNTER — Encounter (HOSPITAL_COMMUNITY): Payer: Self-pay

## 2013-01-21 ENCOUNTER — Ambulatory Visit (HOSPITAL_COMMUNITY)
Admission: RE | Admit: 2013-01-21 | Discharge: 2013-01-21 | Disposition: A | Discharge status: Home / Self Care | Payer: Medicare Other | Source: Ambulatory Visit | Attending: Orthopedic Surgery | Admitting: Orthopedic Surgery

## 2013-01-21 ENCOUNTER — Encounter (HOSPITAL_COMMUNITY)
Admission: RE | Admit: 2013-01-21 | Discharge: 2013-01-21 | Disposition: A | Discharge status: Home / Self Care | Payer: Medicare Other | Source: Ambulatory Visit | Attending: Orthopedic Surgery | Admitting: Orthopedic Surgery

## 2013-01-21 DIAGNOSIS — M1610 Unilateral primary osteoarthritis, unspecified hip: Secondary | ICD-10-CM | POA: Insufficient documentation

## 2013-01-21 DIAGNOSIS — M169 Osteoarthritis of hip, unspecified: Secondary | ICD-10-CM | POA: Insufficient documentation

## 2013-01-21 DIAGNOSIS — IMO0002 Reserved for concepts with insufficient information to code with codable children: Secondary | ICD-10-CM | POA: Insufficient documentation

## 2013-01-21 DIAGNOSIS — Z01812 Encounter for preprocedural laboratory examination: Secondary | ICD-10-CM | POA: Insufficient documentation

## 2013-01-21 HISTORY — DX: Unspecified osteoarthritis, unspecified site: M19.90

## 2013-01-21 LAB — URINALYSIS, ROUTINE W REFLEX MICROSCOPIC
Leukocytes, UA: NEGATIVE
Nitrite: NEGATIVE
Protein, ur: NEGATIVE mg/dL
Urobilinogen, UA: 0.2 mg/dL (ref 0.0–1.0)

## 2013-01-21 LAB — BASIC METABOLIC PANEL
CO2: 28 mEq/L (ref 19–32)
Chloride: 102 mEq/L (ref 96–112)
Glucose, Bld: 116 mg/dL — ABNORMAL HIGH (ref 70–99)
Sodium: 137 mEq/L (ref 135–145)

## 2013-01-21 LAB — CBC
HCT: 45.8 % (ref 39.0–52.0)
MCH: 29.8 pg (ref 26.0–34.0)
MCV: 87.9 fL (ref 78.0–100.0)
RBC: 5.21 MIL/uL (ref 4.22–5.81)
WBC: 6.4 10*3/uL (ref 4.0–10.5)

## 2013-01-21 LAB — SURGICAL PCR SCREEN: MRSA, PCR: NEGATIVE

## 2013-01-21 LAB — APTT: aPTT: 31 seconds (ref 24–37)

## 2013-01-21 NOTE — Progress Notes (Signed)
Cardiac clearnce note dr Clifton James 01-01-2013 epic

## 2013-01-21 NOTE — Progress Notes (Signed)
Medical clearance note dr Felipa Eth 04-03-2012 on chart

## 2013-01-21 NOTE — Patient Instructions (Addendum)
20 [name]  01/21/2013   Your procedure is scheduled on: 01-27-2013  Report to [facility] at 1045 AM.  Call this number if you have problems the morning of surgery [phone]   Remember:   Do not eat food :After Midnight.  Clear liquids midnight until 0745 day of surgery, then nothing by mouth.   Take these medicines the morning of surgery with A SIP OF WATER:  Allopurinol, synthroid, zetiz, metoprolol, crestor                                SEE [place] PREPARING FOR SURGERY SHEET   Do not wear jewelry, make-up or nail polish.  Do not wear lotions, powders, or perfumes. You may wear deodorant.   Men may shave face and neck.  Do not bring valuables to the hospital.  Contacts, dentures or bridgework may not be worn into surgery.  Leave suitcase in the car. After surgery it may be brought to your room.  For patients admitted to the hospital, checkout time is 11:00 AM the day of discharge.   Patients discharged the day of surgery will not be allowed to drive home.  Name and phone number of your driver:  Special Instructions: N/A   Please read over the following fact sheets that you were given: MRSA Information., blood  Fact sheet, incentive spirometer fact sheet, clear liquid sheeet Call Cain Sieve RN pre op nurse if needed [phone]    FAILURE TO FOLLOW THESE INSTRUCTIONS MAY RESULT IN THE CANCELLATION OF YOUR SURGERY. PATIENT SIGNATURE___________________________________________  PT NOTIFIED SURGERY TIME MOVED TO 2:55 PM TOMORROW  01/27/13 AND HIS ARRIVAL TIME IS NOW 11:55 AM.  NOTHING TO EAT AFTER MIDNIGHT TONIGHT BUT THE CLEAR LIQUIDS ARE OK UNTIL 9:00 AM DAY OF SURGERY--NOTHING TO DRINK AFTER 9 AM--ALL OTHER INSTRUCTIONS THE SAME.

## 2013-01-22 ENCOUNTER — Inpatient Hospital Stay (HOSPITAL_COMMUNITY): Admission: RE | Admit: 2013-01-22 | Payer: Medicare Other | Source: Ambulatory Visit

## 2013-01-23 ENCOUNTER — Other Ambulatory Visit (HOSPITAL_COMMUNITY): Payer: Medicare Other

## 2013-01-26 MED ORDER — CEFAZOLIN SODIUM-DEXTROSE 2-3 GM-% IV SOLR: 2.0000 g | INTRAVENOUS | Status: DC

## 2013-01-26 NOTE — H&P (Signed)
TOTAL HIP ADMISSION H&P  Patient is admitted for left total hip arthroplasty, anterior approach.  Subjective:  Chief Complaint: left hip OA / pain  HPI: William Pierce, 72 y.o. male, has a history of pain and functional disability in the left hip(s) due to arthritis and patient has failed non-surgical conservative treatments for greater than 12 weeks to include NSAID's and/or analgesics and activity modification.  Onset of symptoms was gradual starting 6 months ago with gradually worsening course since that time.The patient noted no past surgery on the left hip(s).  Patient currently rates pain in the left hip at 6 out of 10 with activity. Patient has worsening of pain with activity and weight bearing, trendelenberg gait, pain that interfers with activities of daily living and pain with passive range of motion. Patient has evidence of periarticular osteophytes and joint space narrowing by imaging studies. This condition presents safety issues increasing the risk of falls.  There is no current active infection.  Risks, benefits and expectations were discussed with the patient. Patient understand the risks, benefits and expectations and wishes to proceed with surgery.   D/C Plans:   Home with HHPT  Post-op Meds:      Rx given for ASA, Zanaflex, Iron, Colace and MiraLax  Tranexamic Acid:   Not to be given - CAD  Decadron:    Not to be given  FYI:   Nothing to note   Patient Active Problem List   Diagnosis Date Noted  . CAD (coronary artery disease) 07/05/2011  . HTN (hypertension) 07/05/2011   Past Medical History  Diagnosis Date  . Cardiovascular disease   . Hyperlipemia   . GERD (gastroesophageal reflux disease)   . Plantar fasciitis   . Coronary artery disease   . Hypertension   . Benign prostatic hypertrophy   . Erectile dysfunction   . Arthritis     oa    Past Surgical History  Procedure Laterality Date  . Hernia surgery x 2    . Coronary artery bypass graft  LIMA to LAD,  SVG to Intermediate, SVG to PDA    2005 cabg x 3    No Known Allergies    History  Substance Use Topics  . Smoking status: Never Smoker   . Smokeless tobacco: Never Used  . Alcohol Use: Yes     Comment: social    Family History  Problem Relation Age of Onset  . Coronary artery disease    . Heart attack Father      Review of Systems  Constitutional: Negative.   HENT: Negative.   Eyes: Negative.   Respiratory: Negative.   Cardiovascular: Negative.   Gastrointestinal: Negative.   Genitourinary: Negative.   Musculoskeletal: Positive for joint pain.  Skin: Negative.   Neurological: Negative.   Endo/Heme/Allergies: Negative.   Psychiatric/Behavioral: Negative.     Objective:  Physical Exam  Constitutional: He is oriented to person, place, and time. He appears well-developed and well-nourished.  HENT:  Head: Normocephalic and atraumatic.  Mouth/Throat: Oropharynx is clear and moist.  Eyes: Pupils are equal, round, and reactive to light.  Neck: Neck supple. No JVD present. No tracheal deviation present. No thyromegaly present.  Cardiovascular: Normal rate, normal heart sounds and intact distal pulses.   Respiratory: Effort normal and breath sounds normal. No stridor. No respiratory distress. He has no wheezes.  GI: Soft. There is no tenderness. There is no guarding.  Musculoskeletal:       Left hip: He exhibits decreased range of  motion, decreased strength, tenderness and bony tenderness. He exhibits no swelling, no deformity and no laceration.  Lymphadenopathy:    He has no cervical adenopathy.  Neurological: He is alert and oriented to person, place, and time.  Skin: Skin is warm and dry.  Psychiatric: He has a normal mood and affect.    Labs:  Estimated body mass index is 26.40 kg/(m^2) as calculated from the following:   Height as of 12/27/11: 5\' 10"  (1.778 m).   Weight as of 12/27/11: 83.462 kg (184 lb).   Imaging Review Plain radiographs demonstrate moderate  degenerative joint disease of the left hip(s). The bone quality appears to be good for age and reported activity level.  Assessment/Plan:  End stage arthritis, left hip(s)  The patient history, physical examination, clinical judgement of the provider and imaging studies are consistent with end stage degenerative joint disease of the left hip(s) and total hip arthroplasty is deemed medically necessary. The treatment options including medical management, injection therapy, arthroscopy and arthroplasty were discussed at length. The risks and benefits of total hip arthroplasty were presented and reviewed. The risks due to aseptic loosening, infection, stiffness, dislocation/subluxation,  thromboembolic complications and other imponderables were discussed.  The patient acknowledged the explanation, agreed to proceed with the plan and consent was signed. Patient is being admitted for inpatient treatment for surgery, pain control, PT, OT, prophylactic antibiotics, VTE prophylaxis, progressive ambulation and ADL's and discharge planning.The patient is planning to be discharged home with home health services.     William Pierce William Pierce   PAC  01/26/2013, 4:25 PM

## 2013-01-27 ENCOUNTER — Encounter (HOSPITAL_COMMUNITY): Payer: Self-pay | Admitting: *Deleted

## 2013-01-27 ENCOUNTER — Encounter (HOSPITAL_COMMUNITY): Payer: Self-pay | Admitting: Anesthesiology

## 2013-01-27 ENCOUNTER — Inpatient Hospital Stay (HOSPITAL_COMMUNITY)
Admission: RE | Admit: 2013-01-27 | Discharge: 2013-01-28 | DRG: 470 | Disposition: A | Discharge status: Home Health | Payer: Medicare Other | Source: Ambulatory Visit | Attending: Orthopedic Surgery | Admitting: Orthopedic Surgery

## 2013-01-27 ENCOUNTER — Inpatient Hospital Stay (HOSPITAL_COMMUNITY): Payer: Medicare Other

## 2013-01-27 ENCOUNTER — Ambulatory Visit (HOSPITAL_COMMUNITY): Payer: Medicare Other

## 2013-01-27 ENCOUNTER — Encounter (HOSPITAL_COMMUNITY)
Admission: RE | Disposition: A | Discharge status: Home Health | Payer: Self-pay | Source: Ambulatory Visit | Attending: Orthopedic Surgery

## 2013-01-27 ENCOUNTER — Ambulatory Visit (HOSPITAL_COMMUNITY): Payer: Medicare Other | Admitting: Anesthesiology

## 2013-01-27 DIAGNOSIS — I2510 Atherosclerotic heart disease of native coronary artery without angina pectoris: Secondary | ICD-10-CM | POA: Diagnosis present

## 2013-01-27 DIAGNOSIS — I10 Essential (primary) hypertension: Secondary | ICD-10-CM | POA: Diagnosis present

## 2013-01-27 DIAGNOSIS — K219 Gastro-esophageal reflux disease without esophagitis: Secondary | ICD-10-CM | POA: Diagnosis present

## 2013-01-27 DIAGNOSIS — M1610 Unilateral primary osteoarthritis, unspecified hip: Principal | ICD-10-CM | POA: Diagnosis present

## 2013-01-27 DIAGNOSIS — N400 Enlarged prostate without lower urinary tract symptoms: Secondary | ICD-10-CM | POA: Diagnosis present

## 2013-01-27 DIAGNOSIS — Z96649 Presence of unspecified artificial hip joint: Secondary | ICD-10-CM

## 2013-01-27 DIAGNOSIS — Z6826 Body mass index (BMI) 26.0-26.9, adult: Secondary | ICD-10-CM

## 2013-01-27 DIAGNOSIS — D62 Acute posthemorrhagic anemia: Secondary | ICD-10-CM | POA: Diagnosis not present

## 2013-01-27 DIAGNOSIS — Z951 Presence of aortocoronary bypass graft: Secondary | ICD-10-CM

## 2013-01-27 DIAGNOSIS — M169 Osteoarthritis of hip, unspecified: Principal | ICD-10-CM | POA: Diagnosis present

## 2013-01-27 DIAGNOSIS — E785 Hyperlipidemia, unspecified: Secondary | ICD-10-CM | POA: Diagnosis present

## 2013-01-27 DIAGNOSIS — E663 Overweight: Secondary | ICD-10-CM

## 2013-01-27 HISTORY — PX: TOTAL HIP ARTHROPLASTY: SHX124

## 2013-01-27 LAB — TYPE AND SCREEN
ABO/RH(D): O POS
Antibody Screen: NEGATIVE

## 2013-01-27 SURGERY — ARTHROPLASTY, HIP, TOTAL, ANTERIOR APPROACH
Anesthesia: Spinal | Site: Hip | Laterality: Left | Wound class: Clean

## 2013-01-27 MED ORDER — FERROUS SULFATE 325 (65 FE) MG PO TABS
325.0000 mg | ORAL_TABLET | Freq: Three times a day (TID) | ORAL | Status: DC
  Administered 2013-01-28: 325 mg via ORAL
  Filled 2013-01-27 (×4): qty 1

## 2013-01-27 MED ORDER — ACETAMINOPHEN 10 MG/ML IV SOLN
INTRAVENOUS | Status: AC
  Filled 2013-01-27: qty 100

## 2013-01-27 MED ORDER — METHOCARBAMOL 100 MG/ML IJ SOLN: 500.0000 mg | Freq: Four times a day (QID) | INTRAVENOUS | Status: DC | PRN

## 2013-01-27 MED ORDER — METHOCARBAMOL 500 MG PO TABS: 500.0000 mg | ORAL_TABLET | Freq: Four times a day (QID) | ORAL | Status: DC | PRN

## 2013-01-27 MED ORDER — MENTHOL 3 MG MT LOZG: 1.0000 | LOZENGE | OROMUCOSAL | Status: DC | PRN

## 2013-01-27 MED ORDER — HYDROCODONE-ACETAMINOPHEN 7.5-325 MG PO TABS
1.0000 | ORAL_TABLET | Freq: Four times a day (QID) | ORAL | Status: DC
  Administered 2013-01-27 – 2013-01-28 (×4): 1 via ORAL
  Filled 2013-01-27 (×4): qty 1

## 2013-01-27 MED ORDER — 0.9 % SODIUM CHLORIDE (POUR BTL) OPTIME
TOPICAL | Status: DC | PRN
  Administered 2013-01-27: 1000 mL

## 2013-01-27 MED ORDER — ONDANSETRON HCL 4 MG PO TABS: 4.0000 mg | ORAL_TABLET | Freq: Four times a day (QID) | ORAL | Status: DC | PRN

## 2013-01-27 MED ORDER — CEFAZOLIN SODIUM-DEXTROSE 2-3 GM-% IV SOLR
2.0000 g | INTRAVENOUS | Status: AC
  Administered 2013-01-27: 2 g via INTRAVENOUS

## 2013-01-27 MED ORDER — PHENOL 1.4 % MT LIQD: 1.0000 | OROMUCOSAL | Status: DC | PRN

## 2013-01-27 MED ORDER — METOCLOPRAMIDE HCL 10 MG PO TABS: 5.0000 mg | ORAL_TABLET | Freq: Three times a day (TID) | ORAL | Status: DC | PRN

## 2013-01-27 MED ORDER — BUPIVACAINE IN DEXTROSE 0.75-8.25 % IT SOLN
INTRATHECAL | Status: DC | PRN
  Administered 2013-01-27: 2 mL via INTRATHECAL

## 2013-01-27 MED ORDER — LEVOTHYROXINE SODIUM 50 MCG PO TABS
50.0000 ug | ORAL_TABLET | Freq: Every day | ORAL | Status: DC
  Administered 2013-01-28: 50 ug via ORAL
  Filled 2013-01-27 (×2): qty 1

## 2013-01-27 MED ORDER — HYDROMORPHONE HCL PF 1 MG/ML IJ SOLN: 0.2500 mg | INTRAMUSCULAR | Status: DC | PRN

## 2013-01-27 MED ORDER — FENTANYL CITRATE 0.05 MG/ML IJ SOLN
INTRAMUSCULAR | Status: DC | PRN
  Administered 2013-01-27 (×2): 50 ug via INTRAVENOUS

## 2013-01-27 MED ORDER — ONDANSETRON HCL 4 MG/2ML IJ SOLN: 4.0000 mg | Freq: Four times a day (QID) | INTRAMUSCULAR | Status: DC | PRN

## 2013-01-27 MED ORDER — PROPOFOL INFUSION 10 MG/ML OPTIME
INTRAVENOUS | Status: DC | PRN
  Administered 2013-01-27: 100 ug/kg/min via INTRAVENOUS

## 2013-01-27 MED ORDER — RIVAROXABAN 10 MG PO TABS
10.0000 mg | ORAL_TABLET | Freq: Every day | ORAL | Status: DC
  Administered 2013-01-28: 10 mg via ORAL
  Filled 2013-01-27 (×2): qty 1

## 2013-01-27 MED ORDER — SODIUM CHLORIDE 0.9 % IV SOLN
INTRAVENOUS | Status: DC
  Administered 2013-01-28: via INTRAVENOUS
  Filled 2013-01-27 (×5): qty 1000

## 2013-01-27 MED ORDER — HYDROMORPHONE HCL PF 1 MG/ML IJ SOLN: 0.5000 mg | INTRAMUSCULAR | Status: DC | PRN

## 2013-01-27 MED ORDER — EPHEDRINE SULFATE 50 MG/ML IJ SOLN
INTRAMUSCULAR | Status: DC | PRN
  Administered 2013-01-27: 10 mg via INTRAVENOUS
  Administered 2013-01-27: 5 mg via INTRAVENOUS
  Administered 2013-01-27: 10 mg via INTRAVENOUS
  Administered 2013-01-27: 5 mg via INTRAVENOUS

## 2013-01-27 MED ORDER — ACETAMINOPHEN 10 MG/ML IV SOLN: 1000.0000 mg | Freq: Once | INTRAVENOUS | Status: DC | PRN

## 2013-01-27 MED ORDER — METOPROLOL TARTRATE 25 MG PO TABS
25.0000 mg | ORAL_TABLET | Freq: Two times a day (BID) | ORAL | Status: DC
  Administered 2013-01-27 – 2013-01-28 (×2): 25 mg via ORAL
  Filled 2013-01-27 (×3): qty 1

## 2013-01-27 MED ORDER — ALLOPURINOL 100 MG PO TABS
100.0000 mg | ORAL_TABLET | Freq: Every morning | ORAL | Status: DC
  Administered 2013-01-28: 100 mg via ORAL
  Filled 2013-01-27: qty 1

## 2013-01-27 MED ORDER — DOCUSATE SODIUM 100 MG PO CAPS
100.0000 mg | ORAL_CAPSULE | Freq: Two times a day (BID) | ORAL | Status: DC
  Administered 2013-01-27 – 2013-01-28 (×2): 100 mg via ORAL

## 2013-01-27 MED ORDER — POLYETHYLENE GLYCOL 3350 17 G PO PACK
17.0000 g | PACK | Freq: Two times a day (BID) | ORAL | Status: DC
  Administered 2013-01-27 – 2013-01-28 (×2): 17 g via ORAL

## 2013-01-27 MED ORDER — LACTATED RINGERS IV SOLN
INTRAVENOUS | Status: DC
  Administered 2013-01-27: 1000 mL via INTRAVENOUS
  Administered 2013-01-27: 17:00:00 via INTRAVENOUS

## 2013-01-27 MED ORDER — ACETAMINOPHEN 325 MG PO TABS: 650.0000 mg | ORAL_TABLET | Freq: Four times a day (QID) | ORAL | Status: DC | PRN

## 2013-01-27 MED ORDER — ATORVASTATIN CALCIUM 20 MG PO TABS
20.0000 mg | ORAL_TABLET | Freq: Every day | ORAL | Status: DC
  Filled 2013-01-27: qty 1

## 2013-01-27 MED ORDER — MEPERIDINE HCL 50 MG/ML IJ SOLN: 6.2500 mg | INTRAMUSCULAR | Status: DC | PRN

## 2013-01-27 MED ORDER — EZETIMIBE 10 MG PO TABS
10.0000 mg | ORAL_TABLET | Freq: Every morning | ORAL | Status: DC
  Administered 2013-01-28: 10 mg via ORAL
  Filled 2013-01-27: qty 1

## 2013-01-27 MED ORDER — METOCLOPRAMIDE HCL 5 MG/ML IJ SOLN: 5.0000 mg | Freq: Three times a day (TID) | INTRAMUSCULAR | Status: DC | PRN

## 2013-01-27 MED ORDER — PANTOPRAZOLE SODIUM 40 MG PO TBEC
40.0000 mg | DELAYED_RELEASE_TABLET | Freq: Every day | ORAL | Status: DC
Start: 2013-01-27 — End: 2013-01-28
  Administered 2013-01-27 – 2013-01-28 (×2): 40 mg via ORAL
  Filled 2013-01-27 (×2): qty 1

## 2013-01-27 MED ORDER — OXYCODONE HCL 5 MG PO TABS: 5.0000 mg | ORAL_TABLET | Freq: Once | ORAL | Status: DC | PRN

## 2013-01-27 MED ORDER — CEFAZOLIN SODIUM-DEXTROSE 2-3 GM-% IV SOLR
2.0000 g | Freq: Four times a day (QID) | INTRAVENOUS | Status: AC
  Administered 2013-01-27 – 2013-01-28 (×2): 2 g via INTRAVENOUS
  Filled 2013-01-27 (×2): qty 50

## 2013-01-27 MED ORDER — CEFAZOLIN SODIUM-DEXTROSE 2-3 GM-% IV SOLR
INTRAVENOUS | Status: AC
  Filled 2013-01-27: qty 50

## 2013-01-27 MED ORDER — ACETAMINOPHEN 650 MG RE SUPP: 650.0000 mg | Freq: Four times a day (QID) | RECTAL | Status: DC | PRN

## 2013-01-27 MED ORDER — OXYCODONE HCL 5 MG/5ML PO SOLN: 5.0000 mg | Freq: Once | ORAL | Status: DC | PRN

## 2013-01-27 MED ORDER — PROMETHAZINE HCL 25 MG/ML IJ SOLN: 6.2500 mg | INTRAMUSCULAR | Status: DC | PRN

## 2013-01-27 MED ORDER — ACETAMINOPHEN 10 MG/ML IV SOLN
INTRAVENOUS | Status: DC | PRN
  Administered 2013-01-27: 1000 mg via INTRAVENOUS

## 2013-01-27 MED ORDER — LIDOCAINE HCL (CARDIAC) 20 MG/ML IV SOLN
INTRAVENOUS | Status: DC | PRN
  Administered 2013-01-27: 20 mg via INTRAVENOUS

## 2013-01-27 MED ORDER — MIDAZOLAM HCL 5 MG/5ML IJ SOLN
INTRAMUSCULAR | Status: DC | PRN
  Administered 2013-01-27: 2 mg via INTRAVENOUS

## 2013-01-27 SURGICAL SUPPLY — 40 items
ADH SKN CLS APL DERMABOND .7 (GAUZE/BANDAGES/DRESSINGS) ×1
BAG SPEC THK2 15X12 ZIP CLS (MISCELLANEOUS) ×2
BAG ZIPLOCK 12X15 (MISCELLANEOUS) ×4 IMPLANT
BLADE SAW SGTL 18X1.27X75 (BLADE) ×2 IMPLANT
CLOTH BEACON ORANGE TIMEOUT ST (SAFETY) ×2 IMPLANT
DERMABOND ADVANCED (GAUZE/BANDAGES/DRESSINGS) ×1
DERMABOND ADVANCED .7 DNX12 (GAUZE/BANDAGES/DRESSINGS) ×1 IMPLANT
DRAPE C-ARM 42X72 X-RAY (DRAPES) ×2 IMPLANT
DRAPE STERI IOBAN 125X83 (DRAPES) ×2 IMPLANT
DRAPE U-SHAPE 47X51 STRL (DRAPES) ×6 IMPLANT
DRSG AQUACEL AG ADV 3.5X10 (GAUZE/BANDAGES/DRESSINGS) ×2 IMPLANT
DRSG TEGADERM 4X4.75 (GAUZE/BANDAGES/DRESSINGS) ×1 IMPLANT
DURAPREP 26ML APPLICATOR (WOUND CARE) ×2 IMPLANT
ELECT BLADE TIP CTD 4 INCH (ELECTRODE) ×2 IMPLANT
ELECT REM PT RETURN 9FT ADLT (ELECTROSURGICAL) ×2
ELECTRODE REM PT RTRN 9FT ADLT (ELECTROSURGICAL) ×1 IMPLANT
EVACUATOR 1/8 PVC DRAIN (DRAIN) ×1 IMPLANT
FACESHIELD LNG OPTICON STERILE (SAFETY) ×8 IMPLANT
GAUZE SPONGE 2X2 8PLY STRL LF (GAUZE/BANDAGES/DRESSINGS) ×1 IMPLANT
GLOVE BIOGEL PI IND STRL 7.5 (GLOVE) ×1 IMPLANT
GLOVE BIOGEL PI IND STRL 8 (GLOVE) ×1 IMPLANT
GLOVE BIOGEL PI INDICATOR 7.5 (GLOVE) ×1
GLOVE BIOGEL PI INDICATOR 8 (GLOVE) ×1
GLOVE ECLIPSE 8.0 STRL XLNG CF (GLOVE) ×2 IMPLANT
GLOVE ORTHO TXT STRL SZ7.5 (GLOVE) ×4 IMPLANT
GOWN BRE IMP PREV XXLGXLNG (GOWN DISPOSABLE) ×5 IMPLANT
GOWN STRL NON-REIN LRG LVL3 (GOWN DISPOSABLE) ×2 IMPLANT
KIT BASIN OR (CUSTOM PROCEDURE TRAY) ×2 IMPLANT
PACK TOTAL JOINT (CUSTOM PROCEDURE TRAY) ×2 IMPLANT
PADDING CAST COTTON 6X4 STRL (CAST SUPPLIES) ×2 IMPLANT
SPONGE GAUZE 2X2 STER 10/PKG (GAUZE/BANDAGES/DRESSINGS) ×1
SUCTION FRAZIER 12FR DISP (SUCTIONS) ×2 IMPLANT
SUT MNCRL AB 4-0 PS2 18 (SUTURE) ×2 IMPLANT
SUT VIC AB 1 CT1 36 (SUTURE) ×8 IMPLANT
SUT VIC AB 2-0 CT1 27 (SUTURE) ×4
SUT VIC AB 2-0 CT1 TAPERPNT 27 (SUTURE) ×2 IMPLANT
SUT VLOC 180 0 24IN GS25 (SUTURE) ×2 IMPLANT
TOWEL OR 17X26 10 PK STRL BLUE (TOWEL DISPOSABLE) ×4 IMPLANT
TRAY FOLEY CATH 14FRSI W/METER (CATHETERS) ×2 IMPLANT
WATER STERILE IRR 1500ML POUR (IV SOLUTION) ×2 IMPLANT

## 2013-01-27 NOTE — Anesthesia Procedure Notes (Addendum)
Spinal  Start time: 01/27/2013 5:06 PM  Spinal  Patient location during procedure: OR Start time: 01/27/2013 4:22 PM Staffing Anesthesiologist: Gaylan Gerold CRNA/Resident: Early Osmond E Performed by: resident/CRNA  Preanesthetic Checklist Completed: patient identified, site marked, surgical consent, pre-op evaluation, timeout performed, IV checked, risks and benefits discussed and monitors and equipment checked Spinal Block Patient position: sitting Prep: Betadine and site prepped and draped Patient monitoring: heart rate, continuous pulse ox and blood pressure Approach: midline Location: L3-4 Injection technique: single-shot Needle Needle type: Sprotte  Needle gauge: 25 G Needle length: 9 cm Assessment Sensory level: T6

## 2013-01-27 NOTE — Op Note (Signed)
NAME:  BRAVEN WOLK                ACCOUNT NO.: [id number]      MEDICAL RECORD NO.: [id number]      FACILITY:  [facility]      PHYSICIAN:  Durene Romans D  DATE OF BIRTH:  06-25-1941     DATE OF PROCEDURE:  01/27/2013                                 OPERATIVE REPORT         PREOPERATIVE DIAGNOSIS: Left  hip osteoarthritis.      POSTOPERATIVE DIAGNOSIS:  Left hip osteoarthritis.      PROCEDURE:  Left total hip replacement through an anterior approach   utilizing DePuy THR system, component size 58mm pinnacle cup, a size 36+4 neutral   Altrex liner, a size 7 Hi Tri Lock stem with a 36+1.5 metal ball.      SURGEON:  Madlyn Frankel. Charlann Boxer, M.D.      ASSISTANT:  Lanney Gins, PA      ANESTHESIA:  Spinal.      SPECIMENS:  None.      COMPLICATIONS:  None.      BLOOD LOSS:  400 cc     DRAINS:  One Hemovac.      INDICATION OF THE PROCEDURE:  NORRIS BRUMBACH is a 72 y.o. male who had   presented to office for evaluation of left hip pain.  Radiographs revealed   progressive degenerative changes with bone-on-bone   articulation to the  hip joint.  The patient had painful limited range of   motion significantly affecting their overall quality of life.  The patient was failing to    respond to conservative measures, and at this point was ready   to proceed with more definitive measures.  The patient has noted progressive   degenerative changes in his hip, progressive problems and dysfunction   with regarding the hip prior to surgery.  Consent was obtained for   benefit of pain relief.  Specific risk of infection, DVT, component   failure, dislocation, need for revision surgery, as well discussion of   the anterior versus posterior approach were reviewed.  Consent was   obtained for benefit of anterior pain relief through an anterior   approach.      PROCEDURE IN DETAIL:  The patient was brought to operative theater.   Once adequate anesthesia, preoperative  antibiotics, 2gm Ancef administered.   The patient was positioned supine on the OSI Hanna table.  Once adequate   padding of boney process was carried out, we had predraped out the hip, and  used fluoroscopy to confirm orientation of the pelvis and position.      The left hip was then prepped and draped from proximal iliac crest to   mid thigh with shower curtain technique.      Time-out was performed identifying the patient, planned procedure, and   extremity.     An incision was then made 2 cm distal and lateral to the   anterior superior iliac spine extending over the orientation of the   tensor fascia lata muscle and sharp dissection was carried down to the   fascia of the muscle and protractor placed in the soft tissues.      The fascia was then incised.  The muscle belly was identified and swept  laterally and retractor placed along the superior neck.  Following   cauterization of the circumflex vessels and removing some pericapsular   fat, a second cobra retractor was placed on the inferior neck.  A third   retractor was placed on the anterior acetabulum after elevating the   anterior rectus.  A L-capsulotomy was along the line of the   superior neck to the trochanteric fossa, then extended proximally and   distally.  Tag sutures were placed and the retractors were then placed   intracapsular.  We then identified the trochanteric fossa and   orientation of my neck cut, confirmed this radiographically   and then made a neck osteotomy with the femur on traction.  The femoral   head was removed without difficulty or complication.  Traction was let   off and retractors were placed posterior and anterior around the   acetabulum.      The labrum and foveal tissue were debrided.  I began reaming with a 47mm   reamer and reamed up to 57mm reamer with good bony bed preparation and a 58   cup was chosen.  The final 58mm Pinnacle cup was then impacted under fluoroscopy  to confirm the  depth of penetration and orientation with respect to   abduction.  A screw was placed followed by the hole eliminator.  The final   36+4 neutral Altrex liner was impacted with good visualized rim fit.  The cup was positioned anatomically within the acetabular portion of the pelvis.      At this point, the femur was rolled at 80 degrees.  Further capsule was   released off the inferior aspect of the femoral neck.  I then   released the superior capsule proximally.  The hook was placed laterally   along the femur and elevated manually and held in position with the bed   hook.  The leg was then extended and adducted with the leg rolled to 100   degrees of external rotation.  Once the proximal femur was fully   exposed, I used a box osteotome to set orientation.  I then began   broaching with the starting chili pepper broach and passed this by hand and then broached up to 7.  With the 7 broach in place I chose a high offset neck and did a trial reduction.  The offset was appropriate, leg lengths   appeared to be equal, confirmed radiographically.   Given these findings, I went ahead and dislocated the hip, repositioned all   retractors and positioned the right hip in the extended and abducted position.  The final 7 hi Tri Lock stem was   chosen and it was impacted down to the level of neck cut.  Based on this   and the trial reduction, a 36+1.5 metal ball was chosen based on his age and   impacted onto a clean and dry trunnion, and the hip was reduced.  The   hip had been irrigated throughout the case again at this point.  I did   reapproximate the superior capsular leaflet to the anterior leaflet   using #1 Vicryl, placed a medium Hemovac drain deep.  The fascia of the   tensor fascia lata muscle was then reapproximated using #1 Vicryl.  The   remaining wound was closed with 2-0 Vicryl and running 4-0 Monocryl.   The hip was cleaned, dried, and dressed sterilely using Dermabond and   Aquacel  dressing.  Drain site dressed separately.  She was then brought   to recovery room in stable condition tolerating the procedure well.    Lanney Gins, PA-C was present for the entirety of the case involved from   preoperative positioning, perioperative retractor management, general   facilitation of the case, as well as primary wound closure as assistant.            Madlyn Frankel Charlann Boxer, M.D.            MDO/MEDQ  D:  10/02/2011  T:  10/02/2011  Job:  [id number]      Electronically Signed by Durene Romans M.D. on 10/08/2011 09:15:38 AM

## 2013-01-27 NOTE — Anesthesia Postprocedure Evaluation (Signed)
Anesthesia Post Note  Patient: William Pierce  Procedure(s) Performed: Procedure(s) (LRB): TOTAL HIP ARTHROPLASTY ANTERIOR APPROACH (Left)  Anesthesia type: Spinal  Patient location: PACU  Post pain: Pain level controlled  Post assessment: Post-op Vital signs reviewed  Last Vitals: BP 129/78  Pulse 58  Temp(Src) 36.3 C (Oral)  Resp 16  SpO2 100%  Post vital signs: Reviewed  Level of consciousness: sedated  Complications: No apparent anesthesia complications

## 2013-01-27 NOTE — Anesthesia Preprocedure Evaluation (Addendum)
Anesthesia Evaluation  Patient identified by MRN, date of birth, ID band Patient awake    Reviewed: Allergy & Precautions, H&P , NPO status , Patient's Chart, lab work & pertinent test results, reviewed documented beta blocker date and time   Airway Mallampati: II TM Distance: >3 FB Neck ROM: Full    Dental  (+) Dental Advisory Given and Teeth Intact   Pulmonary neg pulmonary ROS,          Cardiovascular hypertension, Pt. on medications and Pt. on home beta blockers + CAD and + Cardiac Stents Rhythm:Regular Rate:Normal     Neuro/Psych PSYCHIATRIC DISORDERS negative neurological ROS     GI/Hepatic Neg liver ROS, GERD-  Medicated,  Endo/Other  negative endocrine ROS  Renal/GU negative Renal ROS     Musculoskeletal negative musculoskeletal ROS (+)   Abdominal   Peds  Hematology negative hematology ROS (+)   Anesthesia Other Findings   Reproductive/Obstetrics                          Anesthesia Physical Anesthesia Plan  ASA: III  Anesthesia Plan: Spinal   Post-op Pain Management:    Induction:   Airway Management Planned: Simple Face Mask  Additional Equipment:   Intra-op Plan:   Post-operative Plan: Extubation in OR  Informed Consent: I have reviewed the patients History and Physical, chart, labs and discussed the procedure including the risks, benefits and alternatives for the proposed anesthesia with the patient or authorized representative who has indicated his/her understanding and acceptance.   Dental advisory given  Plan Discussed with: CRNA  Anesthesia Plan Comments:        Anesthesia Quick Evaluation

## 2013-01-27 NOTE — Preoperative (Signed)
Beta Blockers   Reason not to administer Beta Blockers:Took lopressor this morning

## 2013-01-27 NOTE — Interval H&P Note (Signed)
History and Physical Interval Note:  01/27/2013 3:25 PM  William Pierce  has presented today for surgery, with the diagnosis of left hip oa   The various methods of treatment have been discussed with the patient and family. After consideration of risks, benefits and other options for treatment, the patient has consented to  Procedure(s): TOTAL HIP ARTHROPLASTY ANTERIOR APPROACH (Left) as a surgical intervention .  The patient's history has been reviewed, patient examined, no change in status, stable for surgery.  I have reviewed the patient's chart and labs.  Questions were answered to the patient's satisfaction.     [name]

## 2013-01-27 NOTE — Transfer of Care (Signed)
Immediate Anesthesia Transfer of Care Note  Patient: William Pierce  Procedure(s) Performed: Procedure(s): TOTAL HIP ARTHROPLASTY ANTERIOR APPROACH (Left)  Patient Location: PACU  Anesthesia Type:Spinal  Level of Consciousness: awake and alert   Airway & Oxygen Therapy: Patient Spontanous Breathing and Patient connected to face mask oxygen  Post-op Assessment: Report given to PACU RN and Post -op Vital signs reviewed and stable  Post vital signs: Reviewed and stable  Complications: No apparent anesthesia complications

## 2013-01-28 DIAGNOSIS — D62 Acute posthemorrhagic anemia: Secondary | ICD-10-CM | POA: Diagnosis not present

## 2013-01-28 DIAGNOSIS — E663 Overweight: Secondary | ICD-10-CM

## 2013-01-28 LAB — BASIC METABOLIC PANEL
BUN: 15 mg/dL (ref 6–23)
Chloride: 100 mEq/L (ref 96–112)
GFR calc Af Amer: 77 mL/min — ABNORMAL LOW (ref >90)
Glucose, Bld: 152 mg/dL — ABNORMAL HIGH (ref 70–99)
Potassium: 4.5 mEq/L (ref 3.5–5.1)
Sodium: 135 mEq/L (ref 135–145)

## 2013-01-28 LAB — CBC
HCT: 38.5 % — ABNORMAL LOW (ref 39.0–52.0)
Hemoglobin: 13 g/dL (ref 13.0–17.0)
WBC: 10.6 10*3/uL — ABNORMAL HIGH (ref 4.0–10.5)

## 2013-01-28 MED ORDER — FERROUS SULFATE 325 (65 FE) MG PO TABS: 325.0000 mg | ORAL_TABLET | Freq: Three times a day (TID) | ORAL | Status: DC

## 2013-01-28 MED ORDER — DSS 100 MG PO CAPS: 100.0000 mg | ORAL_CAPSULE | Freq: Two times a day (BID) | ORAL | Status: DC

## 2013-01-28 MED ORDER — POLYETHYLENE GLYCOL 3350 17 G PO PACK: 17.0000 g | PACK | Freq: Two times a day (BID) | ORAL | Status: DC

## 2013-01-28 MED ORDER — HYDROCODONE-ACETAMINOPHEN 7.5-325 MG PO TABS
1.0000 | ORAL_TABLET | ORAL | Status: DC | PRN
Start: 2013-01-28 — End: 2014-01-14

## 2013-01-28 MED ORDER — PNEUMOCOCCAL VAC POLYVALENT 25 MCG/0.5ML IJ INJ: 0.5000 mL | INJECTION | INTRAMUSCULAR | Status: DC

## 2013-01-28 MED ORDER — TIZANIDINE HCL 4 MG PO CAPS: 4.0000 mg | ORAL_CAPSULE | Freq: Three times a day (TID) | ORAL | Status: DC

## 2013-01-28 MED ORDER — ASPIRIN EC 325 MG PO TBEC: 325.0000 mg | DELAYED_RELEASE_TABLET | Freq: Two times a day (BID) | ORAL | Status: DC

## 2013-01-28 NOTE — Progress Notes (Signed)
   Subjective: 1 Day Post-Op Procedure(s) (LRB): TOTAL HIP ARTHROPLASTY ANTERIOR APPROACH (Left)   Patient reports pain as mild, pain well controlled. No events throughout the night. Ready to be discharged home, if he does well with PT and pain continues to be well controlled.   Objective:   VITALS:   Filed Vitals:   01/28/13   BP: 134/77  Pulse: 64  Temp: 98 F (36.7 C)   Resp: 16    Neurovascular intact Dorsiflexion/Plantar flexion intact Incision: dressing C/D/I No cellulitis present Compartment soft  LABS  Recent Labs  01/28/13    0448  HGB 13.0  HCT 38.5*  WBC 10.6*  PLT 165     Recent Labs  01/28/13    0448  NA 135  K 4.5  BUN 15  CREATININE 1.08  GLUCOSE 152*     Assessment/Plan: 1 Day Post-Op Procedure(s) (LRB): TOTAL HIP ARTHROPLASTY ANTERIOR APPROACH (Left) HV drain d/c'ed Foley cath d/c'ed Advance diet Up with therapy D/C IV fluids Discharge home with home health Follow up in 2 weeks at [facility] Follow up with OLIN,Riva Sesma D in 2 weeks.  Contact information:  [facility] [address] [place] [address] [phone]      Expected ABLA  Treated with iron and will observe  Overweight (BMI 25-29.9) Estimated body mass index is 26.02 kg/(m^2) as calculated from the following:   Height as of 01/21/13: 5' 10.5" (1.791 m).   Weight as of 12/27/11: 83.462 kg (184 lb). Patient also counseled that weight may inhibit the healing process Patient counseled that losing weight will help with future health issues      Anastasio Auerbach. Isam Unrein   PAC  01/28/2013, 8:59 AM

## 2013-01-28 NOTE — Evaluation (Signed)
Physical Therapy Evaluation Patient Details Name: William Pierce MRN: [id number] DOB: [birth year] Today's Date: 01/28/2013 Time: 8466-5993 PT Time Calculation (min): 31 min  PT Assessment / Plan / Recommendation Clinical Impression  pt is s/p left anterior THA and will benefit form PT tomaximize independence for return home today or tomorrow    PT Assessment  Patient needs continued PT services    Follow Up Recommendations  Home health PT    Does the patient have the potential to tolerate intense rehabilitation      Barriers to Discharge        Equipment Recommendations  None recommended by PT    Recommendations for Other Services     Frequency 7X/week    Precautions / Restrictions Restrictions Weight Bearing Restrictions: Yes LLE Weight Bearing: Weight bearing as tolerated   Pertinent Vitals/Pain Pain 3/10, premedicated      Mobility  Bed Mobility Bed Mobility: Supine to Sit Supine to Sit: 4: Min assist;4: Min guard Transfers Transfers: Sit to Stand;Stand to Sit Sit to Stand: 4: Min assist;4: Min guard Stand to Sit: 4: Min assist;4: Min guard Details for Transfer Assistance: cues for hand placement and wt shift Ambulation/Gait Ambulation/Gait Assistance: 4: Min guard Assistive device: Agricultural consultant Ambulation/Gait Assistance Details: 165 Gait Pattern: Step-to pattern;Step-through pattern    Exercises Total Joint Exercises Ankle Circles/Pumps: AROM;Both;10 reps Quad Sets: AROM;Both;10 reps   PT Diagnosis: Difficulty walking  PT Problem List: Decreased strength;Decreased range of motion;Decreased activity tolerance;Decreased mobility PT Treatment Interventions: DME instruction;Gait training;Functional mobility training;Stair training;Therapeutic activities;Therapeutic exercise;Patient/family education   PT Goals Acute Rehab PT Goals PT Goal Formulation: With patient Time For Goal Achievement: 01/31/13 Potential to Achieve Goals: Good Pt will go  Supine/Side to Sit: with supervision PT Goal: Supine/Side to Sit - Progress: Goal set today Pt will go Sit to Supine/Side: with supervision PT Goal: Sit to Supine/Side - Progress: Goal set today Pt will go Sit to Stand: with supervision PT Goal: Sit to Stand - Progress: Goal set today Pt will go Stand to Sit: with supervision PT Goal: Stand to Sit - Progress: Goal set today Pt will Ambulate: 51 - 150 feet;with supervision;with least restrictive assistive device PT Goal: Ambulate - Progress: Goal set today Pt will Go Up / Down Stairs: 3-5 stairs;with min assist;with least restrictive assistive device;with rail(s) PT Goal: Up/Down Stairs - Progress: Goal set today Pt will Perform Home Exercise Program: with supervision, verbal cues required/provided PT Goal: Perform Home Exercise Program - Progress: Goal set today  Visit Information  Last PT Received On: 01/28/13 Assistance Needed: +1    Subjective Data  Subjective: I need to get moving Patient Stated Goal: back to tennis eventually   Prior Functioning  Home Living Lives With: Spouse Available Help at Discharge: Family Type of Home: House Home Access: Stairs to enter Secretary/administrator of Steps: 3 Entrance Stairs-Rails: Right Home Layout: One level Home Adaptive Equipment: Walker - rolling Prior Function Level of Independence: Independent Able to Take Stairs?: Yes Driving: Yes Communication Communication: No difficulties    Cognition  Cognition Overall Cognitive Status: Appears within functional limits for tasks assessed/performed Arousal/Alertness: Awake/alert Orientation Level: Appears intact for tasks assessed Behavior During Session: [facility] for tasks performed    Extremity/Trunk Assessment Right Upper Extremity Assessment RUE ROM/Strength/Tone: [facility] for tasks assessed (pt reports he has been working on his UB strength) Left Upper Extremity Assessment LUE ROM/Strength/Tone: [facility] for tasks assessed Right Lower  Extremity Assessment RLE ROM/Strength/Tone: [facility] for tasks assessed Left Lower  Extremity Assessment LLE ROM/Strength/Tone: Deficits;Due to pain   Balance    End of Session PT - End of Session Activity Tolerance: Patient tolerated treatment well Patient left: in chair;with call bell/phone within reach;with family/visitor present Nurse Communication: Mobility status  GP     [facility] 01/28/2013, 11:17 AM

## 2013-01-28 NOTE — Progress Notes (Signed)
Pt stable, scripts, and d/c instructions given with no questions/concerns voiced by pt or wife.  Pt transported via wheelchair to private vehicle with NT and wife. 

## 2013-01-28 NOTE — Progress Notes (Signed)
Utilization review completed.  

## 2013-01-28 NOTE — Care Management Note (Signed)
    Page 1 of 2   01/28/2013     1:41:53 PM   CARE MANAGEMENT NOTE 01/28/2013  Patient:  William Pierce, William Pierce   Account Number:  [id number]  Date Initiated:  01/28/2013  Documentation initiated by:  Colleen Can  Subjective/Objective Assessment:   dx left anterior hip replacemnt     Action/Plan:   CM spoke with patient and spouse. Plans are for patient to return to his home in [place] where spouse will be caregiver. He already has RW, cane and commode seat. Will use [facility] for [facility] services   Anticipated DC Date:  01/28/2013   Anticipated DC Plan:  HOME W HOME HEALTH SERVICES  In-house referral  NA      DC Planning Services  CM consult      [facility] Choice  HOME HEALTH   Choice offered to / List presented to:  C-1 Patient   DME arranged  NA      DME agency  NA     HH arranged  HH-2 PT      [facility] agency  [facility]   Status of service:  Completed, signed off Medicare Important Message given?  NA - LOS <3 / Initial given by admissions (If response is "NO", the following Medicare IM given date fields will be blank) Date Medicare IM given:   Date Additional Medicare IM given:    Discharge Disposition:  HOME W HOME HEALTH SERVICES  Per UR Regulation:    If discussed at Long Length of Stay Meetings, dates discussed:    Comments:  01/28/2013 Colleen Can BSN RN CCM [phone] [name] will provide [facility] services with start date 01/29/2013.

## 2013-01-28 NOTE — Progress Notes (Signed)
OT Cancellation Note  Patient Details Name: JUDDSON COBERN MRN: [id number] DOB: [birth year]   Cancelled Treatment:    Reason Eval/Treat Not Completed: Other (comment)  Pt screened for OT.  No needs.    Mandy Fitzwater 01/28/2013, 12:35 PM Marica Otter, OTR/L [phone] 01/28/2013

## 2013-01-28 NOTE — Progress Notes (Addendum)
Physical Therapy Treatment Patient Details Name: William Pierce MRN: [id number] DOB: 07-19-1941 Today's Date: 01/28/2013 Time: 1435-1500 PT Time Calculation (min): 25 min  PT Assessment / Plan / Recommendation Comments on Treatment Session  doing well    Follow Up Recommendations  Home health PT     Does the patient have the potential to tolerate intense rehabilitation     Barriers to Discharge        Equipment Recommendations  None recommended by PT    Recommendations for Other Services    Frequency 7X/week   Plan Discharge plan remains appropriate;Frequency remains appropriate    Precautions / Restrictions Precautions Precautions: None Restrictions LLE Weight Bearing: Weight bearing as tolerated   Pertinent Vitals/Pain     Mobility  Bed Mobility Bed Mobility: Supine to Sit Supine to Sit: 4: Min guard;5: Supervision Details for Bed Mobility Assistance: cues for use of sheet loop Transfers Transfers: Sit to Stand;Stand to Sit Sit to Stand: 5: Supervision Stand to Sit: 5: Supervision Details for Transfer Assistance: cues for hand placement and wt shift Ambulation/Gait Ambulation/Gait Assistance: 5: Supervision Ambulation Distance (Feet): 150 Feet Assistive device: Rolling walker Ambulation/Gait Assistance Details: cues for step through Gait Pattern: Step-to pattern;Step-through pattern Stairs: Yes Stairs Assistance: 5: Supervision Stairs Assistance Details (indicate cue type and reason): cues for sequence Stair Management Technique: One rail Left;Step to pattern;Sideways Number of Stairs: 3    Exercises Total Joint Exercises Ankle Circles/Pumps: AROM;Both;10 reps  Knee Flexion: AROM;Left;10 reps   PT Diagnosis:    PT Problem List:   PT Treatment Interventions:     PT Goals Acute Rehab PT Goals Time For Goal Achievement: 01/31/13 Potential to Achieve Goals: Good Pt will go Supine/Side to Sit: with supervision PT Goal: Supine/Side to Sit - Progress:  Met Pt will go Sit to Stand: with supervision PT Goal: Sit to Stand - Progress: Met Pt will go Stand to Sit: with supervision PT Goal: Stand to Sit - Progress: Met Pt will Ambulate: 51 - 150 feet;with supervision;with least restrictive assistive device PT Goal: Ambulate - Progress: Met Pt will Go Up / Down Stairs: 3-5 stairs;with min assist;with least restrictive assistive device;with rail(s) PT Goal: Up/Down Stairs - Progress: Met Pt will Perform Home Exercise Program: with supervision, verbal cues required/provided PT Goal: Perform Home Exercise Program - Progress: Met  Visit Information  Last PT Received On: 01/28/13 Assistance Needed: +1    Subjective Data  Subjective: I am ok   Cognition  Cognition Overall Cognitive Status: Appears within functional limits for tasks assessed/performed Arousal/Alertness: Awake/alert Orientation Level: Appears intact for tasks assessed Behavior During Session: [facility] for tasks performed    Balance     End of Session PT - End of Session Activity Tolerance: Patient tolerated treatment well Patient left: in chair;with call bell/phone within reach;with family/visitor present Nurse Communication: Other (comment) (PT to return for stair training before d/c)   GP     [facility] 01/28/2013, 3:21 PM

## 2013-01-28 NOTE — Progress Notes (Signed)
01/28/13 1300  PT Visit Information  Last PT Received On 01/28/13  Assistance Needed +1  PT Time Calculation  PT Start Time 1329  PT Stop Time 1339  PT Time Calculation (min) 10 min  Subjective Data  Subjective I am ok  Precautions  Precautions None  Restrictions  LLE Weight Bearing WBAT  Cognition  Overall Cognitive Status Appears within functional limits for tasks assessed/performed  Arousal/Alertness Awake/alert  Orientation Level Appears intact for tasks assessed  Behavior During Session [facility] for tasks performed  Total Joint Exercises  Ankle Circles/Pumps AROM;Both;10 reps  Quad Sets AROM;Both;10 reps  Short Arc Quad AROM;Left;10 reps  Heel Slides AROM;AAROM;Left;10 reps  Hip ABduction/ADduction Left;10 reps;AAROM  PT - End of Session  Activity Tolerance Patient tolerated treatment well  Patient left in bed;with call bell/phone within reach;with family/visitor present  Nurse Communication Other (comment) (PT to return for stair training before d/c)  PT - Assessment/Plan  Comments on Treatment Session doing well, pain controlled  PT Plan Discharge plan remains appropriate;Frequency remains appropriate  PT Frequency 7X/week  Follow Up Recommendations Home health PT  PT equipment None recommended by PT  Acute Rehab PT Goals  Time For Goal Achievement 01/31/13  Potential to Achieve Goals Good  Pt will Perform Home Exercise Program with supervision, verbal cues required/provided  PT Goal: Perform Home Exercise Program - Progress Progressing toward goal  PT Treatments  $Therapeutic Exercise 8-22 mins

## 2013-01-29 ENCOUNTER — Encounter (HOSPITAL_COMMUNITY): Payer: Self-pay | Admitting: Orthopedic Surgery

## 2013-01-29 NOTE — Discharge Summary (Signed)
Physician Discharge Summary  Patient ID: William Pierce MRN: [id number] DOB/AGE: 01-16-1941 72 y.o.  Admit date: 01/27/2013 Discharge date: 01/28/2013   Procedures:  Procedure(s) (LRB): TOTAL HIP ARTHROPLASTY ANTERIOR APPROACH (Left)  Attending Physician:  Dr. Durene Romans   Admission Diagnoses:   Left hip OA / pain  Discharge Diagnoses:  Principal Problem:   S/P left THA, AA Active Problems:   Expected blood loss anemia   Overweight (BMI 25.0-29.9)  Diagnosis  . Cardiovascular disease  . Hyperlipemia  . GERD (gastroesophageal reflux disease)  . Plantar fasciitis  . Coronary artery disease  . Hypertension  . Benign prostatic hypertrophy  . Erectile dysfunction  . Arthritis    HPI:   William Pierce, 72 y.o. male, has a history of pain and functional disability in the left hip(s) due to arthritis and patient has failed non-surgical conservative treatments for greater than 12 weeks to include NSAID's and/or analgesics and activity modification. Onset of symptoms was gradual starting 6 months ago with gradually worsening course since that time.The patient noted no past surgery on the left hip(s). Patient currently rates pain in the left hip at 6 out of 10 with activity. Patient has worsening of pain with activity and weight bearing, trendelenberg gait, pain that interfers with activities of daily living and pain with passive range of motion. Patient has evidence of periarticular osteophytes and joint space narrowing by imaging studies. This condition presents safety issues increasing the risk of falls. There is no current active infection. Risks, benefits and expectations were discussed with the patient. Patient understand the risks, benefits and expectations and wishes to proceed with surgery.  PCP: Hoyle Sauer, MD   Discharged Condition: good  Hospital Course:  Patient underwent the above stated procedure on 01/27/2013. Patient tolerated the procedure well and brought  to the recovery room in good condition and subsequently to the floor.  POD #1 BP: 134/77 ; Pulse: 64 ; Temp: 98 F (36.7 C) ; Resp: 16  Pt's foley was removed, as well as the hemovac drain removed. IV was changed to a saline lock. Patient reports pain as mild, pain well controlled. No events throughout the night. Ready to be discharged home, if he does well with PT and pain continues to be well controlled.  Neurovascular intact, dorsiflexion/plantar flexion intact, incision: dressing C/D/I, no cellulitis present and compartment soft.   LABS  Basename  01/28/13 0448   HGB  13.0  HCT  38.5    Discharge Exam: General appearance: alert, cooperative and no distress Extremities: Homans sign is negative, no sign of DVT, no edema, redness or tenderness in the calves or thighs and no ulcers, gangrene or trophic changes  Disposition:   Home-Health Care Svc with follow up in 2 weeks   Follow-up Information   Follow up with [name], MD. Schedule an appointment as soon as possible for a visit in 2 weeks.   Contact information:   [address] [place] [address] [phone]       Discharge Orders   Future Orders Complete By Expires     Call MD / Call 911  As directed     Comments:      If you experience chest pain or shortness of breath, CALL 911 and be transported to the hospital emergency room.  If you develope a fever above 101 F, pus (white drainage) or increased drainage or redness at the wound, or calf pain, call your surgeon's office.  Change dressing  As directed     Comments:      Maintain surgical dressing for 10-14 days, then replace with 4x4 guaze and tape. Keep the area dry and clean.    Constipation Prevention  As directed     Comments:      Drink plenty of fluids.  Prune juice may be helpful.  You may use a stool softener, such as Colace (over the counter) 100 mg twice a day.  Use MiraLax (over the counter) for constipation as needed.    Diet - low  sodium heart healthy  As directed     Discharge instructions  As directed     Comments:      Maintain surgical dressing for 10-14 days, then replace with gauze and tape. Keep the area dry and clean until follow up. Follow up in 2 weeks at [facility]. Call with any questions or concerns.    Increase activity slowly as tolerated  As directed     TED hose  As directed     Comments:      Use stockings (TED hose) for 2 weeks on both leg(s).  You may remove them at night for sleeping.    Weight bearing as tolerated  As directed          Medication List    TAKE these medications       allopurinol 100 MG tablet  Commonly known as:  ZYLOPRIM  Take 100 mg by mouth every morning.     aspirin EC 325 MG tablet  Take 1 tablet (325 mg total) by mouth 2 (two) times daily.     DSS 100 MG Caps  Take 100 mg by mouth 2 (two) times daily.     ezetimibe 10 MG tablet  Commonly known as:  ZETIA  Take 10 mg by mouth every morning.     ferrous sulfate 325 (65 FE) MG tablet  Take 1 tablet (325 mg total) by mouth 3 (three) times daily after meals.     fish oil-omega-3 fatty acids 1000 MG capsule  Take 2 g by mouth daily.     HYDROcodone-acetaminophen 7.5-325 MG per tablet  Commonly known as:  NORCO  Take 1 tablet by mouth every 4 (four) hours as needed for pain.     levothyroxine 50 MCG tablet  Commonly known as:  SYNTHROID, LEVOTHROID  Take 50 mcg by mouth daily before breakfast.     metoprolol 50 MG tablet  Commonly known as:  LOPRESSOR  Take 25 mg by mouth 2 (two) times daily. 1/2 tab po bid     omeprazole 20 MG capsule  Commonly known as:  PRILOSEC  Take 20 mg by mouth every Monday, Wednesday, and Friday.     polyethylene glycol packet  Commonly known as:  MIRALAX / GLYCOLAX  Take 17 g by mouth 2 (two) times daily.     rosuvastatin 10 MG tablet  Commonly known as:  CRESTOR  Take 10 mg by mouth every morning.     tadalafil 20 MG tablet  Commonly known as:  CIALIS    Take 20 mg by mouth daily as needed.     tiZANidine 4 MG capsule  Commonly known as:  ZANAFLEX  Take 1 capsule (4 mg total) by mouth 3 (three) times daily. Muscle spasms         Signed: Anastasio Auerbach. Aayush Gelpi   PAC  01/29/2013, 5:47 PM

## 2013-02-02 ENCOUNTER — Emergency Department (HOSPITAL_COMMUNITY)
Admission: EM | Admit: 2013-02-02 | Discharge: 2013-02-02 | Disposition: A | Discharge status: Home / Self Care | Payer: Medicare Other | Attending: Emergency Medicine | Admitting: Emergency Medicine

## 2013-02-02 ENCOUNTER — Encounter (HOSPITAL_COMMUNITY): Payer: Self-pay

## 2013-02-02 DIAGNOSIS — M25473 Effusion, unspecified ankle: Secondary | ICD-10-CM | POA: Insufficient documentation

## 2013-02-02 DIAGNOSIS — M7989 Other specified soft tissue disorders: Secondary | ICD-10-CM

## 2013-02-02 DIAGNOSIS — M25472 Effusion, left ankle: Secondary | ICD-10-CM

## 2013-02-02 DIAGNOSIS — Z951 Presence of aortocoronary bypass graft: Secondary | ICD-10-CM | POA: Insufficient documentation

## 2013-02-02 DIAGNOSIS — Z7982 Long term (current) use of aspirin: Secondary | ICD-10-CM | POA: Insufficient documentation

## 2013-02-02 DIAGNOSIS — I10 Essential (primary) hypertension: Secondary | ICD-10-CM | POA: Insufficient documentation

## 2013-02-02 DIAGNOSIS — N400 Enlarged prostate without lower urinary tract symptoms: Secondary | ICD-10-CM | POA: Insufficient documentation

## 2013-02-02 DIAGNOSIS — Z8679 Personal history of other diseases of the circulatory system: Secondary | ICD-10-CM | POA: Insufficient documentation

## 2013-02-02 DIAGNOSIS — K219 Gastro-esophageal reflux disease without esophagitis: Secondary | ICD-10-CM | POA: Insufficient documentation

## 2013-02-02 DIAGNOSIS — I2510 Atherosclerotic heart disease of native coronary artery without angina pectoris: Secondary | ICD-10-CM | POA: Insufficient documentation

## 2013-02-02 DIAGNOSIS — Z79899 Other long term (current) drug therapy: Secondary | ICD-10-CM | POA: Insufficient documentation

## 2013-02-02 DIAGNOSIS — Z8739 Personal history of other diseases of the musculoskeletal system and connective tissue: Secondary | ICD-10-CM | POA: Insufficient documentation

## 2013-02-02 DIAGNOSIS — Z87448 Personal history of other diseases of urinary system: Secondary | ICD-10-CM | POA: Insufficient documentation

## 2013-02-02 DIAGNOSIS — E785 Hyperlipidemia, unspecified: Secondary | ICD-10-CM | POA: Insufficient documentation

## 2013-02-02 DIAGNOSIS — Z96649 Presence of unspecified artificial hip joint: Secondary | ICD-10-CM

## 2013-02-02 DIAGNOSIS — M25476 Effusion, unspecified foot: Secondary | ICD-10-CM | POA: Insufficient documentation

## 2013-02-02 NOTE — ED Notes (Signed)
Vascular Tech to bedside.

## 2013-02-02 NOTE — ED Notes (Signed)
Patient reports that he had left hip surgery 6 days ago and noted last night that he had swelling in his left ankle and increased pain. Patient states that the swelling did not go down this AM.

## 2013-02-02 NOTE — ED Provider Notes (Signed)
History     CSN: [id number]  Arrival date & time 02/02/13  0710   First MD Initiated Contact with Patient 02/02/13 0715      Chief Complaint  Patient presents with  . Foot Swelling    (Consider location/radiation/quality/duration/timing/severity/associated sxs/prior treatment) HPI This 72 year old male is sent to the emergency department to have an ultrasound of his left leg to rule out DVT. He had a left hip replacement 6 days ago and has done quite well and only has mild left hip pain. Yesterday evening and overnight he noticed that he has slight swelling to his left ankle; his doctor's office who referred him to the ED to get an ultrasound. Patient has minimal if any pain to his left ankle, no pain to his left foot, no color change or rash to his left foot or leg, no weakness or numbness to his left leg, no chest pain shortness breath cough fever or pain in his calf or thigh, and no treatment prior to arrival. He has had intermittent swelling to his left leg with multiple negative ultrasounds in the past 12 years or so. He has been wearing compression stockings. He has been active and walking with a cane since his surgery. Past Medical History  Diagnosis Date  . Cardiovascular disease   . Hyperlipemia   . GERD (gastroesophageal reflux disease)   . Plantar fasciitis   . Coronary artery disease   . Hypertension   . Benign prostatic hypertrophy   . Erectile dysfunction   . Arthritis     oa    Past Surgical History  Procedure Laterality Date  . Hernia surgery x 2    . Coronary artery bypass graft  LIMA to LAD, SVG to Intermediate, SVG to PDA    2005 cabg x 3  . Total hip arthroplasty Left 01/27/2013    Procedure: TOTAL HIP ARTHROPLASTY ANTERIOR APPROACH;  Surgeon: [name], MD;  Location: [facility];  Service: Orthopedics;  Laterality: Left;    Family History  Problem Relation Age of Onset  . Coronary artery disease    . Heart attack Father     History  Substance Use  Topics  . Smoking status: Never Smoker   . Smokeless tobacco: Never Used  . Alcohol Use: Yes     Comment: social      Review of Systems 10 Systems reviewed and are negative for acute change except as noted in the HPI. Allergies  Review of patient's allergies indicates no known allergies.  Home Medications   Current Outpatient Rx  Name  Route  Sig  Dispense  Refill  . allopurinol (ZYLOPRIM) 100 MG tablet   Oral   Take 100 mg by mouth every morning.          Marland Kitchen aspirin EC 325 MG tablet   Oral   Take 1 tablet (325 mg total) by mouth 2 (two) times daily.   60 tablet   0   . docusate sodium 100 MG CAPS   Oral   Take 100 mg by mouth 2 (two) times daily.   10 capsule      . ezetimibe (ZETIA) 10 MG tablet   Oral   Take 10 mg by mouth every morning.         . ferrous sulfate 325 (65 FE) MG tablet   Oral   Take 1 tablet (325 mg total) by mouth 3 (three) times daily after meals.         . fish  oil-omega-3 fatty acids 1000 MG capsule   Oral   Take 2 g by mouth daily.         Marland Kitchen HYDROcodone-acetaminophen (NORCO) 7.5-325 MG per tablet   Oral   Take 1 tablet by mouth every 4 (four) hours as needed for pain.   120 tablet   0   . levothyroxine (SYNTHROID, LEVOTHROID) 50 MCG tablet   Oral   Take 50 mcg by mouth daily before breakfast.         . metoprolol (LOPRESSOR) 50 MG tablet   Oral   Take 25 mg by mouth 2 (two) times daily. 1/2 tab po bid         . omeprazole (PRILOSEC) 20 MG capsule   Oral   Take 20 mg by mouth every Monday, Wednesday, and Friday.         . polyethylene glycol (MIRALAX / GLYCOLAX) packet   Oral   Take 17 g by mouth 2 (two) times daily.   14 each      . rosuvastatin (CRESTOR) 10 MG tablet   Oral   Take 10 mg by mouth every morning.         . tadalafil (CIALIS) 20 MG tablet   Oral   Take 20 mg by mouth daily as needed.         Marland Kitchen tiZANidine (ZANAFLEX) 4 MG capsule   Oral   Take 1 capsule (4 mg total) by mouth 3 (three)  times daily. Muscle spasms   50 capsule   0     BP 148/83  Pulse 75  Temp(Src) 98.3 F (36.8 C) (Oral)  Resp 16  SpO2 100%  Physical Exam  Nursing note and vitals reviewed. Constitutional:  Awake, alert, nontoxic appearance.  HENT:  Head: Atraumatic.  Eyes: Right eye exhibits no discharge. Left eye exhibits no discharge.  Neck: Neck supple.  Cardiovascular: Normal rate and regular rhythm.   No murmur heard. Pulmonary/Chest: Effort normal and breath sounds normal. No respiratory distress. He has no wheezes. He has no rales. He exhibits no tenderness.  Abdominal: Soft. Bowel sounds are normal. He exhibits no distension. There is no tenderness. There is no rebound and no guarding.  Musculoskeletal: He exhibits tenderness.  Baseline ROM, no obvious new focal weakness. Both arms and right leg and nontender. Left leg is nontender the thigh and calf with no asymmetry in calf circumference bilaterally, he has minimal ankle edema and minimal if any tenderness to his medial left ankle, his left foot is nontender with capillary refill less than 2 seconds normal light touch good movement and dorsalis pedis pulse intact.  Neurological: He is alert.  Mental status and motor strength appears baseline for patient and situation.  Skin: No rash noted.  Psychiatric: He has a normal mood and affect.    ED Course  Procedures (including critical care time) Patient / Family / Caregiver understand and agree with initial ED impression and plan with expectations set for ED visit. Labs Reviewed - No data to display No results found.   1. Hip joint replacement by other means   2. Left ankle swelling       MDM  Pt stable in ED with no significant deterioration in condition.  Patient / Family / Caregiver informed of clinical course, understand medical decision-making process, and agree with plan.          Hurman Horn, MD 02/09/13 [phone]

## 2013-02-02 NOTE — Progress Notes (Signed)
VASCULAR LAB PRELIMINARY  PRELIMINARY  PRELIMINARY  PRELIMINARY  Left lower extremity venous duplex completed.    Preliminary report:  Left:  No evidence of DVT, superficial thrombosis, or Baker's cyst.  Kiven Vangilder, RVS 02/02/2013, 8:37 AM

## 2013-09-16 ENCOUNTER — Other Ambulatory Visit (HOSPITAL_COMMUNITY): Payer: Self-pay | Admitting: Internal Medicine

## 2013-09-16 ENCOUNTER — Ambulatory Visit (HOSPITAL_COMMUNITY)
Admission: RE | Admit: 2013-09-16 | Discharge: 2013-09-16 | Disposition: A | Discharge status: Home / Self Care | Payer: Medicare Other | Source: Ambulatory Visit | Attending: Vascular Surgery | Admitting: Vascular Surgery

## 2013-09-16 DIAGNOSIS — R42 Dizziness and giddiness: Secondary | ICD-10-CM

## 2013-09-16 DIAGNOSIS — R0989 Other specified symptoms and signs involving the circulatory and respiratory systems: Secondary | ICD-10-CM | POA: Insufficient documentation

## 2013-12-24 ENCOUNTER — Ambulatory Visit: Payer: Medicare Other | Admitting: Cardiovascular Disease

## 2014-01-14 ENCOUNTER — Encounter: Payer: Self-pay | Admitting: Cardiovascular Disease

## 2014-01-14 ENCOUNTER — Ambulatory Visit (INDEPENDENT_AMBULATORY_CARE_PROVIDER_SITE_OTHER): Payer: Medicare Other | Admitting: Cardiovascular Disease

## 2014-01-14 VITALS — BP 124/62 | HR 54 | Ht 70.5 in | Wt 187.0 lb

## 2014-01-14 DIAGNOSIS — I2510 Atherosclerotic heart disease of native coronary artery without angina pectoris: Secondary | ICD-10-CM

## 2014-01-14 DIAGNOSIS — E785 Hyperlipidemia, unspecified: Secondary | ICD-10-CM

## 2014-01-14 NOTE — Progress Notes (Signed)
History of Present Illness: 73 yo WM with history of CAD s/p CABG 2005, HLD, GERD, ED here today cardiac follow up. He has been followed in the past by Dr. Doreatha Lew. HIs CABG was 08/10/04 per Dr. Verlene Mayer. Last cath 2008 with patent SVG to intermediate, SVG to RCA and LIMA to LAD. LVEF was 55% by cath in 2008. He had a left hip replacement February 2014 and did well .  He tells me that he is doing well. He plays Tennis and golf and exercises frequently. No chest pain, SOB, palpitations, near syncope or syncope.   Primary Care Physician: Dr. Dagmar Hait  Last Lipid Profile: Followed in primary care.   Past Medical History  Diagnosis Date  . Cardiovascular disease   . Hyperlipemia   . GERD (gastroesophageal reflux disease)   . Plantar fasciitis   . Coronary artery disease   . Hypertension   . Benign prostatic hypertrophy   . Erectile dysfunction   . Arthritis     oa    Past Surgical History  Procedure Laterality Date  . Hernia surgery x 2    . Coronary artery bypass graft  LIMA to LAD, SVG to Intermediate, SVG to PDA    2005 cabg x 3  . Total hip arthroplasty Left 01/27/2013    Procedure: TOTAL HIP ARTHROPLASTY ANTERIOR APPROACH;  Surgeon: Mauri Pole, MD;  Location: [facility];  Service: Orthopedics;  Laterality: Left;    Current Outpatient Prescriptions  Medication Sig Dispense Refill  . allopurinol (ZYLOPRIM) 100 MG tablet Take 100 mg by mouth every morning.       Marland Kitchen aspirin EC 325 MG tablet Take 1 tablet (325 mg total) by mouth 2 (two) times daily.  60 tablet  0  . ezetimibe (ZETIA) 10 MG tablet Take 10 mg by mouth every morning.      . fish oil-omega-3 fatty acids 1000 MG capsule Take 2 g by mouth daily.      Marland Kitchen levothyroxine (SYNTHROID, LEVOTHROID) 50 MCG tablet Take 50 mcg by mouth daily before breakfast.      . metoprolol (LOPRESSOR) 50 MG tablet Take 25 mg by mouth 2 (two) times daily. 1/2 tab po bid      . omeprazole (PRILOSEC) 20 MG capsule Take 20 mg by mouth every Monday,  Wednesday, and Friday.      . rosuvastatin (CRESTOR) 10 MG tablet Take 10 mg by mouth every morning.      . tadalafil (CIALIS) 20 MG tablet Take 20 mg by mouth daily as needed.       No current facility-administered medications for this visit.    No Known Allergies  History   Social History  . Marital Status: Married    Spouse Name: N/A    Number of Children: N/A  . Years of Education: N/A   Occupational History  . Retired-banker    Social History Main Topics  . Smoking status: Never Smoker   . Smokeless tobacco: Never Used  . Alcohol Use: Yes     Comment: social  . Drug Use: No  . Sexual Activity: Not on file   Other Topics Concern  . Not on file   Social History Narrative    Positive for coronary disease in both his father as     well as a stroke in his mother.          Family History  Problem Relation Age of Onset  . Coronary artery disease    .  Heart attack Father     Review of Systems:  As stated in the HPI and otherwise negative.   BP 124/62  Pulse 54  Ht 5' 10.5" (1.791 m)  Wt 187 lb (84.823 kg)  BMI 26.44 kg/m2  Physical Examination: General: Well developed, well nourished, NAD HEENT: OP clear, mucus membranes moist SKIN: warm, dry. No rashes. Neuro: No focal deficits Musculoskeletal: Muscle strength 5/5 all ext Psychiatric: Mood and affect normal Neck: No JVD, no carotid bruits, no thyromegaly, no lymphadenopathy. Lungs:Clear bilaterally, no wheezes, rhonci, crackles Cardiovascular: Regular rate and rhythm. No murmurs, gallops or rubs. Abdomen:Soft. Bowel sounds present. Non-tender.  Extremities: No lower extremity edema. Pulses are 2 + in the bilateral DP/PT.  EKG: Sinus brady, rate 54 bpm. Non-specific T wave abnormalities.   Assessment and Plan:   1. CAD: Stable. Will continue current therapy with ASA/statin/beta blocker. Will arrange exercise stress myoview to exclude ischemic since it has been years since ischemic testing. Echo to  evaluate LVEF, exclude valve issues.   2. Hyperlipidemia: Continue staitn.

## 2014-01-14 NOTE — Patient Instructions (Signed)
Your physician wants you to follow-up in:  12 months.  You will receive a reminder letter in the mail two months in advance. If you don't receive a letter, please call our office to schedule the follow-up appointment.  Your physician has requested that you have an echocardiogram. Echocardiography is a painless test that uses sound waves to create images of your heart. It provides your doctor with information about the size and shape of your heart and how well your heart's chambers and valves are working. This procedure takes approximately one hour. There are no restrictions for this procedure.   Your physician has requested that you have an exercise stress myoview. For further information please visit [email or web address]. Please follow instruction sheet, as given.

## 2014-02-02 ENCOUNTER — Ambulatory Visit (HOSPITAL_COMMUNITY): Payer: Medicare Other | Attending: Cardiology | Admitting: Radiology

## 2014-02-02 ENCOUNTER — Encounter: Payer: Self-pay | Admitting: Cardiology

## 2014-02-02 VITALS — BP 189/94 | Ht 70.5 in | Wt 182.0 lb

## 2014-02-02 DIAGNOSIS — R9431 Abnormal electrocardiogram [ECG] [EKG]: Secondary | ICD-10-CM | POA: Insufficient documentation

## 2014-02-02 DIAGNOSIS — I4949 Other premature depolarization: Secondary | ICD-10-CM

## 2014-02-02 DIAGNOSIS — I2510 Atherosclerotic heart disease of native coronary artery without angina pectoris: Secondary | ICD-10-CM

## 2014-02-02 DIAGNOSIS — Z8249 Family history of ischemic heart disease and other diseases of the circulatory system: Secondary | ICD-10-CM | POA: Insufficient documentation

## 2014-02-02 DIAGNOSIS — I10 Essential (primary) hypertension: Secondary | ICD-10-CM | POA: Insufficient documentation

## 2014-02-02 MED ORDER — TECHNETIUM TC 99M SESTAMIBI GENERIC - CARDIOLITE
33.0000 | Freq: Once | INTRAVENOUS | Status: AC | PRN
  Administered 2014-02-02: 33 via INTRAVENOUS

## 2014-02-02 MED ORDER — REGADENOSON 0.4 MG/5ML IV SOLN
0.4000 mg | Freq: Once | INTRAVENOUS | Status: AC
  Administered 2014-02-02: 0.4 mg via INTRAVENOUS

## 2014-02-02 MED ORDER — TECHNETIUM TC 99M SESTAMIBI GENERIC - CARDIOLITE
10.8000 | Freq: Once | INTRAVENOUS | Status: AC | PRN
  Administered 2014-02-02: 11 via INTRAVENOUS

## 2014-02-02 NOTE — Progress Notes (Signed)
[facility] [address] [place], [place] [address] [phone]    Cardiology Nuclear Med Study  William Pierce is a 73 y.o. male     MRN : [id number]     DOB: [birth year]  Procedure Date: 02/02/2014  Nuclear Med Background Indication for Stress Test:  Evaluation for Ischemia, Graft Patency and Abnormal EKG History:  '05 [id number] Cardiac Risk Factors: Family History - CAD, Hypertension and Lipids  Symptoms:  none   Nuclear Pre-Procedure Caffeine/Decaff Intake:  None > 12 hrs NPO After: 7:00pm   Lungs:  clear O2 Sat: 96% on room air. IV 0.9% NS with Angio Cath:  22g  IV Site: R Antecubital x 1, tolerated well IV Started by:  Irven Baltimore, RN  Chest Size (in):  40 Cup Size: n/a  Height: 5' 10.5" (1.791 m)  Weight:  182 lb (82.555 kg)  BMI:  Body mass index is 25.74 kg/(m^2). Tech Comments:  Held lopressor x 24 hrs. No medications today. Patient had nonspecific T wave abnormality and HTN with Stress test. Dr. Mare Ferrari consulted with EKG and images. Patient was discharged per Dr. Mare Ferrari.    Nuclear Med Study 1 or 2 day study: 1 day  Stress Test Type:  Treadmill/Lexiscan  Reading MD: N/A  Order Authorizing Provider:  Lauree Chandler, MD  Resting Radionuclide: Technetium 71m Sestamibi  Resting Radionuclide Dose: 11.0 mCi   Stress Radionuclide:  Technetium 80m Sestamibi  Stress Radionuclide Dose: 33.0 mCi           Stress Protocol Rest HR: 61 Stress HR: 98  Rest BP: 189 Stress BP: 226/85  Exercise Time (min): 5:30 METS: 1.6   Predicted Max HR: 146 bpm % Max HR: 67.12 bpm Rate Pressure Product: 22148   Dose of Adenosine (mg):  n/a Dose of Lexiscan: 0.4 mg  Dose of Atropine (mg): n/a Dose of Dobutamine: n/a mcg/kg/min (at max HR)  Stress Test Technologist: Matilde Haymaker, RN  Nuclear Technologist:  Vedia Pereyra, CNMT     Rest Procedure:  Myocardial perfusion imaging was performed at rest 45 minutes following the  intravenous administration of Technetium 33m Sestamibi. Rest ECG: NSR with non-specific ST-T wave changes  Stress Procedure:  The patient exercised on the treadmill utilizing the Bruce Protocol for 3:30 minutes. The patient stopped due to HTN and converted to low level Lexiscan. Patient received IV Lexiscan 0.4 mg over 15 seconds with low level exercise. Technetium 18m Sestamibi was then injected at 30 seconds. Quantitative spect images were obtained after 45 minute delay. Dr. Mare Ferrari was consulted with EKG changes and images. Stress ECG: No significant ST segment change suggestive of ischemia.  QPS Raw Data Images:  Normal; no motion artifact; normal heart/lung ratio. Stress Images:  Normal homogeneous uptake in all areas of the myocardium. Rest Images:  Normal homogeneous uptake in all areas of the myocardium. Subtraction (SDS):  No evidence of ischemia. Transient Ischemic Dilatation (Normal <1.22):  1.01 Lung/Heart Ratio (Normal <0.45):  0.31  Quantitative Gated Spect Images QGS EDV:  114 ml QGS ESV:  49 ml  Impression Exercise Capacity:  Lexiscan with low level exercise. BP Response:  Normal blood pressure response. Clinical Symptoms:  No significant symptoms noted. ECG Impression:  No significant ST segment change suggestive of ischemia. Comparison with Prior Nuclear Study: No images to compare  Overall Impression:  Normal stress nuclear study.  LV Ejection Fraction: 57%.  LV Wall Motion:  NL LV Function; NL Wall Motion.  Thayer Headings, Brooke Bonito., MD, [facility] 02/03/2014, 2:01 PM Office - [phone] Pager [phone]

## 2014-02-04 ENCOUNTER — Other Ambulatory Visit (HOSPITAL_COMMUNITY): Payer: Medicare Other

## 2014-02-11 ENCOUNTER — Ambulatory Visit (HOSPITAL_COMMUNITY): Payer: Medicare Other | Attending: Cardiovascular Disease | Admitting: Cardiology

## 2014-02-11 ENCOUNTER — Encounter: Payer: Self-pay | Admitting: Cardiovascular Disease

## 2014-02-11 DIAGNOSIS — I2510 Atherosclerotic heart disease of native coronary artery without angina pectoris: Secondary | ICD-10-CM | POA: Insufficient documentation

## 2014-02-11 DIAGNOSIS — I059 Rheumatic mitral valve disease, unspecified: Secondary | ICD-10-CM

## 2014-02-11 DIAGNOSIS — I359 Nonrheumatic aortic valve disorder, unspecified: Secondary | ICD-10-CM

## 2014-02-11 NOTE — Progress Notes (Signed)
Echo performed. 

## 2014-02-26 ENCOUNTER — Telehealth: Payer: Self-pay | Admitting: Cardiovascular Disease

## 2014-02-26 NOTE — Telephone Encounter (Signed)
Wife calls to discuss results of echo & stress test. Reassurance given. She did not think we had any other EKGs. I told her we did have other ekgs to compare with his recent ekg.  Reassurance given. All questions answered Horton Chin RN

## 2014-02-26 NOTE — Telephone Encounter (Signed)
New message    Wife calling with a couple of questions    1. Print out echo & stress test . Patient wife stated he had previous EKG done to compare. Dr.Tennant / Dr. Dagmar Hait  was his previous cardiologist.    2. Asking for repeat echo.  Wife is trying to understand follow up  .

## 2015-02-14 ENCOUNTER — Ambulatory Visit: Payer: Medicare Other | Admitting: Cardiovascular Disease

## 2015-02-21 ENCOUNTER — Encounter: Payer: Self-pay | Admitting: Cardiovascular Disease

## 2015-02-21 ENCOUNTER — Ambulatory Visit (INDEPENDENT_AMBULATORY_CARE_PROVIDER_SITE_OTHER): Payer: Medicare Other | Admitting: Cardiovascular Disease

## 2015-02-21 VITALS — BP 130/84 | HR 52 | Ht 70.0 in | Wt 186.0 lb

## 2015-02-21 DIAGNOSIS — I340 Nonrheumatic mitral (valve) insufficiency: Secondary | ICD-10-CM

## 2015-02-21 DIAGNOSIS — E785 Hyperlipidemia, unspecified: Secondary | ICD-10-CM

## 2015-02-21 DIAGNOSIS — I2510 Atherosclerotic heart disease of native coronary artery without angina pectoris: Secondary | ICD-10-CM

## 2015-02-21 NOTE — Patient Instructions (Signed)
Your physician wants you to follow-up in:  12 months.  You will receive a reminder letter in the mail two months in advance. If you don't receive a letter, please call our office to schedule the follow-up appointment.   Your physician has requested that you have an echocardiogram. Echocardiography is a painless test that uses sound waves to create images of your heart. It provides your doctor with information about the size and shape of your heart and how well your heart's chambers and valves are working. This procedure takes approximately one hour. There are no restrictions for this procedure. To be done this summer.  In about 3 months.

## 2015-02-21 NOTE — Progress Notes (Signed)
CC: Follow up CAD  History of Present Illness: 74 yo WM with history of CAD s/p CABG 2005, HLD, mitral regurgitation, GERD, ED here today cardiac follow up. William Pierce has been followed in the past by Dr. Doreatha Lew. HIs CABG was 08/10/04 per Dr. Verlene Mayer. Last cath 2008 with patent SVG to intermediate, SVG to RCA and LIMA to LAD. LVEF was 55% by cath in 2008. Echo 02/11/14 with normal LV function, moderate MR. Stress myoview 02/02/14 with no ischemia.   William Pierce is here today for follow up. William Pierce tells me that William Pierce is doing well. William Pierce plays Tennis and golf and exercises frequently. No chest pain, SOB, palpitations, near syncope or syncope. No lower extremity edema, orthopnea.   Primary Care Physician: Dr. Dagmar Hait  Last Lipid Profile: Followed in primary care.   Past Medical History  Diagnosis Date  . Cardiovascular disease   . Hyperlipemia   . GERD (gastroesophageal reflux disease)   . Plantar fasciitis   . Coronary artery disease   . Hypertension   . Benign prostatic hypertrophy   . Erectile dysfunction   . Arthritis     oa    Past Surgical History  Procedure Laterality Date  . Hernia surgery x 2    . Coronary artery bypass graft  LIMA to LAD, SVG to Intermediate, SVG to PDA    2005 cabg x 3  . Total hip arthroplasty Left 01/27/2013    Procedure: TOTAL HIP ARTHROPLASTY ANTERIOR APPROACH;  Surgeon: Mauri Pole, MD;  Location: [facility];  Service: Orthopedics;  Laterality: Left;    Current Outpatient Prescriptions  Medication Sig Dispense Refill  . allopurinol (ZYLOPRIM) 100 MG tablet Take 300 mg by mouth every morning.     Marland Kitchen aspirin EC 325 MG tablet Take 1 tablet (325 mg total) by mouth 2 (two) times daily. (Patient taking differently: Take 81 mg by mouth 2 (two) times daily. ) 60 tablet 0  . ezetimibe (ZETIA) 10 MG tablet Take 10 mg by mouth every morning.    . fish oil-omega-3 fatty acids 1000 MG capsule Take 2 g by mouth daily.    Marland Kitchen levothyroxine (SYNTHROID, LEVOTHROID) 50 MCG tablet Take 50 mcg by  mouth daily before breakfast.    . metoprolol (LOPRESSOR) 50 MG tablet Take 25 mg by mouth 2 (two) times daily. 1/2 tab po bid    . omeprazole (PRILOSEC) 20 MG capsule Take 20 mg by mouth every Monday, Wednesday, and Friday.    . rosuvastatin (CRESTOR) 10 MG tablet Take 10 mg by mouth every morning.    . sildenafil (REVATIO) 20 MG tablet Take 20 mg by mouth as needed.     No current facility-administered medications for this visit.    No Known Allergies  History   Social History  . Marital Status: Married    Spouse Name: N/A  . Number of Children: N/A  . Years of Education: N/A   Occupational History  . Retired-banker    Social History Main Topics  . Smoking status: Never Smoker   . Smokeless tobacco: Never Used  . Alcohol Use: Yes     Comment: social  . Drug Use: No  . Sexual Activity: Not on file   Other Topics Concern  . Not on file   Social History Narrative    Positive for coronary disease in both his father as     well as a stroke in his mother.          Family History  Problem Relation Age of Onset  . Coronary artery disease    . Heart attack Father     Review of Systems:  As stated in the HPI and otherwise negative.   BP 130/84 mmHg  Pulse 52  Ht 5\' 10"  (1.778 m)  Wt 186 lb (84.369 kg)  BMI 26.69 kg/m2  Physical Examination: General: Well developed, well nourished, NAD HEENT: OP clear, mucus membranes moist SKIN: warm, dry. No rashes. Neuro: No focal deficits Musculoskeletal: Muscle strength 5/5 all ext Psychiatric: Mood and affect normal Neck: No JVD, no carotid bruits, no thyromegaly, no lymphadenopathy. Lungs:Clear bilaterally, no wheezes, rhonci, crackles Cardiovascular: Regular rate and rhythm. No murmurs, gallops or rubs. Abdomen:Soft. Bowel sounds present. Non-tender.  Extremities: No lower extremity edema. Pulses are 2 + in the bilateral DP/PT.  Echo 02/11/14: Left ventricle: The cavity size was normal. Wall thickness was normal.  Systolic function was normal. The estimated ejection fraction was in the range of 55% to 60%. Wall motion was normal; there were no regional wall motion abnormalities. - Mitral valve: Moderate regurgitation. - Left atrium: The atrium was mildly dilated.  Stress myoview 02/02/14: Stress Procedure: The patient exercised on the treadmill utilizing the Bruce Protocol for 3:30 minutes. The patient stopped due to HTN and converted to low level Lexiscan. Patient received IV Lexiscan 0.4 mg over 15 seconds with low level exercise. Technetium 44m Sestamibi was then injected at 30 seconds. Quantitative spect images were obtained after 45 minute delay. Dr. Mare Ferrari was consulted with EKG changes and images. Stress ECG: No significant ST segment change suggestive of ischemia. QPS Raw Data Images: Normal; no motion artifact; normal heart/lung ratio. Stress Images: Normal homogeneous uptake in all areas of the myocardium. Rest Images: Normal homogeneous uptake in all areas of the myocardium. Subtraction (SDS): No evidence of ischemia. Transient Ischemic Dilatation (Normal <1.22): 1.01 Lung/Heart Ratio (Normal <0.45): 0.31 Quantitative Gated Spect Images QGS EDV: 114 ml QGS ESV: 49 ml Impression Exercise Capacity: Lexiscan with low level exercise. BP Response: Normal blood pressure response. Clinical Symptoms: No significant symptoms noted. ECG Impression: No significant ST segment change suggestive of ischemia. Comparison with Prior Nuclear Study: No images to compare Overall Impression: Normal stress nuclear study. LV Ejection Fraction: 57%. LV Wall Motion: NL LV Function; NL Wall Motion.  EKG: Sinus brady with 1st degree AV block, rate 52 bpm. Non-specific T wave abnormalities.   Assessment and Plan:   1. CAD: Stable. Will continue current therapy with ASA/statin/beta blocker.   2. Hyperlipidemia: Continue statin. Lipids controlled per pt in primary care.   3. Mitral  regurgitation: moderate by echo March 2015. Will repeat this summer.

## 2015-06-16 ENCOUNTER — Other Ambulatory Visit: Payer: Self-pay

## 2015-06-16 ENCOUNTER — Ambulatory Visit (HOSPITAL_COMMUNITY): Payer: Medicare Other | Attending: Cardiovascular Disease

## 2015-06-16 ENCOUNTER — Other Ambulatory Visit (HOSPITAL_COMMUNITY): Payer: Medicare Other

## 2015-06-16 DIAGNOSIS — I340 Nonrheumatic mitral (valve) insufficiency: Secondary | ICD-10-CM | POA: Diagnosis present

## 2015-06-16 DIAGNOSIS — E785 Hyperlipidemia, unspecified: Secondary | ICD-10-CM | POA: Diagnosis not present

## 2015-06-16 DIAGNOSIS — I10 Essential (primary) hypertension: Secondary | ICD-10-CM | POA: Insufficient documentation

## 2015-06-16 DIAGNOSIS — I2510 Atherosclerotic heart disease of native coronary artery without angina pectoris: Secondary | ICD-10-CM | POA: Insufficient documentation

## 2015-06-16 DIAGNOSIS — I351 Nonrheumatic aortic (valve) insufficiency: Secondary | ICD-10-CM | POA: Insufficient documentation

## 2015-06-16 DIAGNOSIS — I517 Cardiomegaly: Secondary | ICD-10-CM | POA: Diagnosis not present

## 2015-08-11 ENCOUNTER — Encounter: Payer: Self-pay | Admitting: Gastroenterology

## 2015-12-19 DIAGNOSIS — M1990 Unspecified osteoarthritis, unspecified site: Secondary | ICD-10-CM | POA: Diagnosis not present

## 2015-12-19 DIAGNOSIS — Z6826 Body mass index (BMI) 26.0-26.9, adult: Secondary | ICD-10-CM | POA: Diagnosis not present

## 2015-12-19 DIAGNOSIS — I10 Essential (primary) hypertension: Secondary | ICD-10-CM | POA: Diagnosis not present

## 2015-12-19 DIAGNOSIS — I2510 Atherosclerotic heart disease of native coronary artery without angina pectoris: Secondary | ICD-10-CM | POA: Diagnosis not present

## 2015-12-19 DIAGNOSIS — E784 Other hyperlipidemia: Secondary | ICD-10-CM | POA: Diagnosis not present

## 2015-12-19 DIAGNOSIS — N183 Chronic kidney disease, stage 3 (moderate): Secondary | ICD-10-CM | POA: Diagnosis not present

## 2015-12-19 DIAGNOSIS — E038 Other specified hypothyroidism: Secondary | ICD-10-CM | POA: Diagnosis not present

## 2015-12-19 DIAGNOSIS — Z1389 Encounter for screening for other disorder: Secondary | ICD-10-CM | POA: Diagnosis not present

## 2016-01-17 DIAGNOSIS — R69 Illness, unspecified: Secondary | ICD-10-CM | POA: Diagnosis not present

## 2016-03-14 ENCOUNTER — Ambulatory Visit (INDEPENDENT_AMBULATORY_CARE_PROVIDER_SITE_OTHER): Payer: Medicare HMO | Admitting: Cardiovascular Disease

## 2016-03-14 ENCOUNTER — Encounter: Payer: Self-pay | Admitting: Cardiovascular Disease

## 2016-03-14 VITALS — BP 108/62 | HR 50 | Ht 70.0 in | Wt 188.8 lb

## 2016-03-14 DIAGNOSIS — I340 Nonrheumatic mitral (valve) insufficiency: Secondary | ICD-10-CM | POA: Diagnosis not present

## 2016-03-14 DIAGNOSIS — I2510 Atherosclerotic heart disease of native coronary artery without angina pectoris: Secondary | ICD-10-CM

## 2016-03-14 DIAGNOSIS — E785 Hyperlipidemia, unspecified: Secondary | ICD-10-CM | POA: Diagnosis not present

## 2016-03-14 NOTE — Patient Instructions (Signed)

## 2016-03-14 NOTE — Progress Notes (Signed)
Chief Complaint  Patient presents with  . Follow-up      History of Present Illness: 75 yo WM with history of CAD s/p CABG 2005, HLD, mitral regurgitation, GERD, ED here today cardiac follow up. He has been followed in the past by Dr. Doreatha Lew. His CABG was 08/10/04 per Dr. Verlene Mayer. Last cath 2008 with patent SVG to intermediate, SVG to RCA and LIMA to LAD. LVEF was 55% by cath in 2008. Echo July 2016 with normal LV function, moderate MR. Stress myoview 02/02/14 with no ischemia.   He is here today for follow up. He tells me that he is doing well. He plays Tennis and golf and exercises frequently. No chest pain, SOB, palpitations, near syncope or syncope. No lower extremity edema, orthopnea.   Primary Care Physician: Dr. Dagmar Hait  Last Lipid Profile: Followed in primary care.   Past Medical History  Diagnosis Date  . Cardiovascular disease   . Hyperlipemia   . GERD (gastroesophageal reflux disease)   . Plantar fasciitis   . Coronary artery disease   . Hypertension   . Benign prostatic hypertrophy   . Erectile dysfunction   . Arthritis     oa    Past Surgical History  Procedure Laterality Date  . Hernia surgery x 2    . Coronary artery bypass graft  LIMA to LAD, SVG to Intermediate, SVG to PDA    2005 cabg x 3  . Total hip arthroplasty Left 01/27/2013    Procedure: TOTAL HIP ARTHROPLASTY ANTERIOR APPROACH;  Surgeon: Mauri Pole, MD;  Location: [facility];  Service: Orthopedics;  Laterality: Left;    Current Outpatient Prescriptions  Medication Sig Dispense Refill  . allopurinol (ZYLOPRIM) 100 MG tablet Take 300 mg by mouth every morning.     Marland Kitchen aspirin EC 325 MG tablet Take 1 tablet (325 mg total) by mouth 2 (two) times daily. (Patient taking differently: Take 81 mg by mouth 2 (two) times daily. ) 60 tablet 0  . ezetimibe (ZETIA) 10 MG tablet Take 10 mg by mouth every morning.    . fish oil-omega-3 fatty acids 1000 MG capsule Take 2 g by mouth daily.    Marland Kitchen levothyroxine  (SYNTHROID, LEVOTHROID) 50 MCG tablet Take 50 mcg by mouth daily before breakfast.    . metoprolol (LOPRESSOR) 50 MG tablet Take 25 mg by mouth 2 (two) times daily. 1/2 tab po bid    . omeprazole (PRILOSEC) 20 MG capsule Take 20 mg by mouth every Monday, Wednesday, and Friday.    . rosuvastatin (CRESTOR) 10 MG tablet Take 10 mg by mouth every morning.    . sildenafil (REVATIO) 20 MG tablet Take 20 mg by mouth as needed.     No current facility-administered medications for this visit.    No Known Allergies  Social History   Social History  . Marital Status: Married    Spouse Name: N/A  . Number of Children: N/A  . Years of Education: N/A   Occupational History  . Retired-banker    Social History Main Topics  . Smoking status: Never Smoker   . Smokeless tobacco: Never Used  . Alcohol Use: Yes     Comment: social  . Drug Use: No  . Sexual Activity: Not on file   Other Topics Concern  . Not on file   Social History Narrative    Positive for coronary disease in both his father as     well as a stroke in his mother.  Family History  Problem Relation Age of Onset  . Coronary artery disease    . Heart attack Father     Review of Systems:  As stated in the HPI and otherwise negative.   BP 108/62 mmHg  Pulse 50  Ht 5\' 10"  (1.778 m)  Wt 188 lb 12.8 oz (85.639 kg)  BMI 27.09 kg/m2  SpO2 96%  Physical Examination: General: Well developed, well nourished, NAD HEENT: OP clear, mucus membranes moist SKIN: warm, dry. No rashes. Neuro: No focal deficits Musculoskeletal: Muscle strength 5/5 all ext Psychiatric: Mood and affect normal Neck: No JVD, no carotid bruits, no thyromegaly, no lymphadenopathy. Lungs:Clear bilaterally, no wheezes, rhonci, crackles Cardiovascular: Regular rate and rhythm. No murmurs, gallops or rubs. Abdomen:Soft. Bowel sounds present. Non-tender.  Extremities: No lower extremity edema. Pulses are 2 + in the bilateral DP/PT.  Echo July  2016: Left ventricle: The cavity size was normal. Wall thickness was  normal. Systolic function was normal. The estimated ejection  fraction was in the range of 55% to 60%. - Aortic valve: There was trivial regurgitation. - Mitral valve: Systolic bowing without prolapse. There was mild to  moderate regurgitation directed centrally. - Left atrium: The atrium was moderately dilated.  Stress myoview 02/02/14: Stress Procedure: The patient exercised on the treadmill utilizing the Bruce Protocol for 3:30 minutes. The patient stopped due to HTN and converted to low level Lexiscan. Patient received IV Lexiscan 0.4 mg over 15 seconds with low level exercise. Technetium 32m Sestamibi was then injected at 30 seconds. Quantitative spect images were obtained after 45 minute delay. Dr. Mare Ferrari was consulted with EKG changes and images. Stress ECG: No significant ST segment change suggestive of ischemia. QPS Raw Data Images: Normal; no motion artifact; normal heart/lung ratio. Stress Images: Normal homogeneous uptake in all areas of the myocardium. Rest Images: Normal homogeneous uptake in all areas of the myocardium. Subtraction (SDS): No evidence of ischemia. Transient Ischemic Dilatation (Normal <1.22): 1.01 Lung/Heart Ratio (Normal <0.45): 0.31 Quantitative Gated Spect Images QGS EDV: 114 ml QGS ESV: 49 ml Impression Exercise Capacity: Lexiscan with low level exercise. BP Response: Normal blood pressure response. Clinical Symptoms: No significant symptoms noted. ECG Impression: No significant ST segment change suggestive of ischemia. Comparison with Prior Nuclear Study: No images to compare Overall Impression: Normal stress nuclear study. LV Ejection Fraction: 57%. LV Wall Motion: NL LV Function; NL Wall Motion.  EKG:  EKG is ordered today. The ekg ordered today demonstrates sinus brady, 1st degree AV block, rate 50 bpm. T wave abn, unchanged.   Recent Labs: No results  found for requested labs within last 365 days.   Lipid Panel No results found for: CHOL, TRIG, HDL, CHOLHDL, VLDL, LDLCALC, LDLDIRECT   Wt Readings from Last 3 Encounters:  03/14/16 188 lb 12.8 oz (85.639 kg)  02/21/15 186 lb (84.369 kg)  02/02/14 182 lb (82.555 kg)     Other studies Reviewed: Additional studies/ records that were reviewed today include: . Review of the above records demonstrates:    Assessment and Plan:   1. CAD: Stable. Will continue current therapy with ASA/statin/beta blocker.   2. Hyperlipidemia: Continue statin. Lipids controlled per pt in primary care.   3. Mitral regurgitation: moderate by echo July 2016. Repeat July 2018.   Current medicines are reviewed at length with the patient today.  The patient does not have concerns regarding medicines.  The following changes have been made:  no change  Labs/ tests ordered today include:  No orders  of the defined types were placed in this encounter.    Disposition:   FU with me in 12  months  Signed, Lauree Chandler, MD 03/14/2016 3:51 PM    [facility] [place], [place], [place]  [address] Phone: [phone]; Fax: [phone]

## 2016-03-21 DIAGNOSIS — Z471 Aftercare following joint replacement surgery: Secondary | ICD-10-CM | POA: Diagnosis not present

## 2016-03-21 DIAGNOSIS — S76012D Strain of muscle, fascia and tendon of left hip, subsequent encounter: Secondary | ICD-10-CM | POA: Diagnosis not present

## 2016-03-21 DIAGNOSIS — Z96642 Presence of left artificial hip joint: Secondary | ICD-10-CM | POA: Diagnosis not present

## 2016-06-22 DIAGNOSIS — M25561 Pain in right knee: Secondary | ICD-10-CM | POA: Diagnosis not present

## 2016-06-22 DIAGNOSIS — M25552 Pain in left hip: Secondary | ICD-10-CM | POA: Diagnosis not present

## 2016-06-22 DIAGNOSIS — M79652 Pain in left thigh: Secondary | ICD-10-CM | POA: Diagnosis not present

## 2016-06-25 ENCOUNTER — Other Ambulatory Visit (HOSPITAL_COMMUNITY): Payer: Self-pay | Admitting: Orthopedic Surgery

## 2016-06-25 DIAGNOSIS — M25561 Pain in right knee: Secondary | ICD-10-CM

## 2016-06-25 DIAGNOSIS — M25562 Pain in left knee: Secondary | ICD-10-CM

## 2016-07-02 ENCOUNTER — Encounter (HOSPITAL_COMMUNITY)
Admission: RE | Admit: 2016-07-02 | Discharge: 2016-07-02 | Disposition: A | Discharge status: Home / Self Care | Payer: Medicare HMO | Source: Ambulatory Visit | Attending: Orthopedic Surgery | Admitting: Orthopedic Surgery

## 2016-07-02 DIAGNOSIS — M25562 Pain in left knee: Secondary | ICD-10-CM | POA: Diagnosis not present

## 2016-07-02 DIAGNOSIS — M25552 Pain in left hip: Secondary | ICD-10-CM | POA: Diagnosis not present

## 2016-07-02 MED ORDER — TECHNETIUM TC 99M MEDRONATE IV KIT
25.0000 | PACK | Freq: Once | INTRAVENOUS | Status: AC | PRN
  Administered 2016-07-02: 25 via INTRAVENOUS

## 2016-07-11 DIAGNOSIS — R8299 Other abnormal findings in urine: Secondary | ICD-10-CM | POA: Diagnosis not present

## 2016-07-11 DIAGNOSIS — M109 Gout, unspecified: Secondary | ICD-10-CM | POA: Diagnosis not present

## 2016-07-11 DIAGNOSIS — E038 Other specified hypothyroidism: Secondary | ICD-10-CM | POA: Diagnosis not present

## 2016-07-11 DIAGNOSIS — I10 Essential (primary) hypertension: Secondary | ICD-10-CM | POA: Diagnosis not present

## 2016-07-11 DIAGNOSIS — Z125 Encounter for screening for malignant neoplasm of prostate: Secondary | ICD-10-CM | POA: Diagnosis not present

## 2016-07-11 DIAGNOSIS — E784 Other hyperlipidemia: Secondary | ICD-10-CM | POA: Diagnosis not present

## 2016-07-18 DIAGNOSIS — Z0000 Encounter for general adult medical examination without abnormal findings: Secondary | ICD-10-CM | POA: Diagnosis not present

## 2016-07-18 DIAGNOSIS — I2510 Atherosclerotic heart disease of native coronary artery without angina pectoris: Secondary | ICD-10-CM | POA: Diagnosis not present

## 2016-07-18 DIAGNOSIS — K219 Gastro-esophageal reflux disease without esophagitis: Secondary | ICD-10-CM | POA: Diagnosis not present

## 2016-07-18 DIAGNOSIS — I129 Hypertensive chronic kidney disease with stage 1 through stage 4 chronic kidney disease, or unspecified chronic kidney disease: Secondary | ICD-10-CM | POA: Diagnosis not present

## 2016-07-18 DIAGNOSIS — N401 Enlarged prostate with lower urinary tract symptoms: Secondary | ICD-10-CM | POA: Diagnosis not present

## 2016-07-18 DIAGNOSIS — E784 Other hyperlipidemia: Secondary | ICD-10-CM | POA: Diagnosis not present

## 2016-07-18 DIAGNOSIS — N183 Chronic kidney disease, stage 3 (moderate): Secondary | ICD-10-CM | POA: Diagnosis not present

## 2016-07-18 DIAGNOSIS — M109 Gout, unspecified: Secondary | ICD-10-CM | POA: Diagnosis not present

## 2016-07-18 DIAGNOSIS — R0989 Other specified symptoms and signs involving the circulatory and respiratory systems: Secondary | ICD-10-CM | POA: Diagnosis not present

## 2016-07-18 DIAGNOSIS — M1990 Unspecified osteoarthritis, unspecified site: Secondary | ICD-10-CM | POA: Diagnosis not present

## 2016-08-14 DIAGNOSIS — J029 Acute pharyngitis, unspecified: Secondary | ICD-10-CM | POA: Diagnosis not present

## 2016-08-14 DIAGNOSIS — J0100 Acute maxillary sinusitis, unspecified: Secondary | ICD-10-CM | POA: Diagnosis not present

## 2016-08-14 DIAGNOSIS — H1030 Unspecified acute conjunctivitis, unspecified eye: Secondary | ICD-10-CM | POA: Diagnosis not present

## 2016-08-14 DIAGNOSIS — Z6827 Body mass index (BMI) 27.0-27.9, adult: Secondary | ICD-10-CM | POA: Diagnosis not present

## 2016-08-23 DIAGNOSIS — Z23 Encounter for immunization: Secondary | ICD-10-CM | POA: Diagnosis not present

## 2016-10-25 DIAGNOSIS — D2239 Melanocytic nevi of other parts of face: Secondary | ICD-10-CM | POA: Diagnosis not present

## 2016-10-25 DIAGNOSIS — L918 Other hypertrophic disorders of the skin: Secondary | ICD-10-CM | POA: Diagnosis not present

## 2016-10-25 DIAGNOSIS — L853 Xerosis cutis: Secondary | ICD-10-CM | POA: Diagnosis not present

## 2016-10-25 DIAGNOSIS — D224 Melanocytic nevi of scalp and neck: Secondary | ICD-10-CM | POA: Diagnosis not present

## 2016-10-25 DIAGNOSIS — Z85828 Personal history of other malignant neoplasm of skin: Secondary | ICD-10-CM | POA: Diagnosis not present

## 2016-10-25 DIAGNOSIS — M71341 Other bursal cyst, right hand: Secondary | ICD-10-CM | POA: Diagnosis not present

## 2016-10-25 DIAGNOSIS — L821 Other seborrheic keratosis: Secondary | ICD-10-CM | POA: Diagnosis not present

## 2016-10-25 DIAGNOSIS — L570 Actinic keratosis: Secondary | ICD-10-CM | POA: Diagnosis not present

## 2016-10-30 DIAGNOSIS — H02831 Dermatochalasis of right upper eyelid: Secondary | ICD-10-CM | POA: Diagnosis not present

## 2016-10-30 DIAGNOSIS — H02834 Dermatochalasis of left upper eyelid: Secondary | ICD-10-CM | POA: Diagnosis not present

## 2016-10-30 DIAGNOSIS — H2513 Age-related nuclear cataract, bilateral: Secondary | ICD-10-CM | POA: Diagnosis not present

## 2016-11-28 ENCOUNTER — Encounter (INDEPENDENT_AMBULATORY_CARE_PROVIDER_SITE_OTHER): Payer: Self-pay

## 2016-11-28 ENCOUNTER — Ambulatory Visit (INDEPENDENT_AMBULATORY_CARE_PROVIDER_SITE_OTHER): Payer: Medicare HMO | Admitting: Cardiovascular Disease

## 2016-11-28 ENCOUNTER — Telehealth: Payer: Self-pay | Admitting: Cardiovascular Disease

## 2016-11-28 ENCOUNTER — Encounter: Payer: Self-pay | Admitting: Cardiovascular Disease

## 2016-11-28 VITALS — BP 130/70 | HR 53 | Ht 70.0 in | Wt 183.4 lb

## 2016-11-28 DIAGNOSIS — R0609 Other forms of dyspnea: Secondary | ICD-10-CM | POA: Diagnosis not present

## 2016-11-28 DIAGNOSIS — R0600 Dyspnea, unspecified: Secondary | ICD-10-CM

## 2016-11-28 DIAGNOSIS — E7800 Pure hypercholesterolemia, unspecified: Secondary | ICD-10-CM

## 2016-11-28 DIAGNOSIS — I2510 Atherosclerotic heart disease of native coronary artery without angina pectoris: Secondary | ICD-10-CM

## 2016-11-28 DIAGNOSIS — I340 Nonrheumatic mitral (valve) insufficiency: Secondary | ICD-10-CM

## 2016-11-28 NOTE — Patient Instructions (Addendum)
Medication Instructions:  Your physician recommends that you continue on your current medications as directed. Please refer to the Current Medication list given to you today.   Labwork: none  Testing/Procedures: Your physician has requested that you have an echocardiogram. Echocardiography is a painless test that uses sound waves to create images of your heart. It provides your doctor with information about the size and shape of your heart and how well your heart's chambers and valves are working. This procedure takes approximately one hour. There are no restrictions for this procedure.  Your physician has requested that you have an exercise stress myoview. For further information please visit [email or web address]. Please follow instruction sheet, as given.   Follow-Up: Your physician recommends that you schedule a follow-up appointment in: about 3-4 weeks. Scheduled for January 19,2018 at 10:30    Any Other Special Instructions Will Be Listed Below (If Applicable).     If you need a refill on your cardiac medications before your next appointment, please call your pharmacy.

## 2016-11-28 NOTE — Telephone Encounter (Signed)
I spoke with pt's wife. She reports pt has had shortness of breath when going up stairs for a few weeks.  Last episode was last night. Improves with rest. No chest pain. Wife is requesting office visit.  I scheduled pt to see Dr. Angelena Form today at 4:30

## 2016-11-28 NOTE — Telephone Encounter (Signed)
New Message  Pt wife called requesting to speak with RN about getting pt an appt with only Dr Angelena Form sooner than next available. Please call back to discuss   Pt c/o Shortness Of Breath: STAT if SOB developed within the last 24 hours or pt is noticeably SOB on the phone  1. Are you currently SOB (can you hear that pt is SOB on the phone)? No   2. How long have you been experiencing SOB? Per pt wife a couple of months  3. Are you SOB when sitting or when up moving around? Per pt wife wife. Going up the stairs   4. Are you currently experiencing any other symptoms? No

## 2016-11-28 NOTE — Progress Notes (Signed)
Chief Complaint  Patient presents with  . Shortness of Breath     History of Present Illness: 75 yo WM with history of CAD s/p CABG 2005, HLD, mitral regurgitation, GERD, ED here today cardiac follow up. He has been followed in the past by Dr. Doreatha Lew. His CABG was 08/10/04 per Dr. Servando Snare. Last cath 2008 with patent SVG to intermediate, SVG to RCA and LIMA to LAD. LVEF was 55% by cath in 2008. Echo July 2016 with normal LV function, moderate MR. Stress myoview 02/02/14 with no ischemia.   He is here today for follow up. He is added onto my schedule. He called in with c/o dyspnea when climbing.  He plays Tennis and golf and exercises frequently. No chest pain, SOB, palpitations, near syncope or syncope. No lower extremity edema, orthopnea.   Primary Care Physician: Tivis Ringer, MD  Past Medical History:  Diagnosis Date  . Arthritis    oa  . Benign prostatic hypertrophy   . Cardiovascular disease   . Coronary artery disease   . Erectile dysfunction   . GERD (gastroesophageal reflux disease)   . Hyperlipemia   . Hypertension   . Plantar fasciitis     Past Surgical History:  Procedure Laterality Date  . CORONARY ARTERY BYPASS GRAFT  LIMA to LAD, SVG to Intermediate, SVG to PDA   2005 cabg x 3  . Hernia surgery x 2    . TOTAL HIP ARTHROPLASTY Left 01/27/2013   Procedure: TOTAL HIP ARTHROPLASTY ANTERIOR APPROACH;  Surgeon: Mauri Pole, MD;  Location: [facility];  Service: Orthopedics;  Laterality: Left;    Current Outpatient Prescriptions  Medication Sig Dispense Refill  . allopurinol (ZYLOPRIM) 300 MG tablet Take 300 mg by mouth daily.    Marland Kitchen aspirin EC 81 MG tablet Take 81 mg by mouth daily.    Marland Kitchen ezetimibe (ZETIA) 10 MG tablet Take 10 mg by mouth every morning.    . fish oil-omega-3 fatty acids 1000 MG capsule Take 2 g by mouth daily.    Marland Kitchen levothyroxine (SYNTHROID, LEVOTHROID) 50 MCG tablet Take 50 mcg by mouth daily before breakfast.    . metoprolol (LOPRESSOR) 50 MG  tablet Take 25 mg by mouth 2 (two) times daily. 1/2 tab po bid    . omeprazole (PRILOSEC) 20 MG capsule Take 20 mg by mouth every Monday, Wednesday, and Friday.    . rosuvastatin (CRESTOR) 10 MG tablet Take 10 mg by mouth every morning.    . sildenafil (REVATIO) 20 MG tablet Take 20 mg by mouth daily as needed (ED).      No current facility-administered medications for this visit.     No Known Allergies  Social History   Social History  . Marital status: Married    Spouse name: N/A  . Number of children: N/A  . Years of education: N/A   Occupational History  . Retired-banker    Social History Main Topics  . Smoking status: Never Smoker  . Smokeless tobacco: Never Used  . Alcohol use Yes     Comment: social  . Drug use: No  . Sexual activity: Not on file   Other Topics Concern  . Not on file   Social History Narrative    Positive for coronary disease in both his father as     well as a stroke in his mother.          Family History  Problem Relation Age of Onset  . Coronary artery disease    .  Heart attack Father     Review of Systems:  As stated in the HPI and otherwise negative.   BP 130/70   Pulse (!) 53   Ht 5\' 10"  (1.778 m)   Wt 183 lb 6.4 oz (83.2 kg)   BMI 26.32 kg/m   Physical Examination: General: Well developed, well nourished, NAD  HEENT: OP clear, mucus membranes moist  SKIN: warm, dry. No rashes. Neuro: No focal deficits  Musculoskeletal: Muscle strength 5/5 all ext  Psychiatric: Mood and affect normal  Neck: No JVD, no carotid bruits, no thyromegaly, no lymphadenopathy.  Lungs:Clear bilaterally, no wheezes, rhonci, crackles Cardiovascular: Regular rate and rhythm. No murmurs, gallops or rubs. Abdomen:Soft. Bowel sounds present. Non-tender.  Extremities: No lower extremity edema. Pulses are 2 + in the bilateral DP/PT.  Echo July 2016: Left ventricle: The cavity size was normal. Wall thickness was  normal. Systolic function was normal.  The estimated ejection  fraction was in the range of 55% to 60%. - Aortic valve: There was trivial regurgitation. - Mitral valve: Systolic bowing without prolapse. There was mild to  moderate regurgitation directed centrally. - Left atrium: The atrium was moderately dilated.  Stress myoview 02/02/14: Stress Procedure: The patient exercised on the treadmill utilizing the Bruce Protocol for 3:30 minutes. The patient stopped due to HTN and converted to low level Lexiscan. Patient received IV Lexiscan 0.4 mg over 15 seconds with low level exercise. Technetium 9m Sestamibi was then injected at 30 seconds. Quantitative spect images were obtained after 45 minute delay. Dr. Mare Ferrari was consulted with EKG changes and images. Stress ECG: No significant ST segment change suggestive of ischemia. QPS Raw Data Images: Normal; no motion artifact; normal heart/lung ratio. Stress Images: Normal homogeneous uptake in all areas of the myocardium. Rest Images: Normal homogeneous uptake in all areas of the myocardium. Subtraction (SDS): No evidence of ischemia. Transient Ischemic Dilatation (Normal <1.22): 1.01 Lung/Heart Ratio (Normal <0.45): 0.31 Quantitative Gated Spect Images QGS EDV: 114 ml QGS ESV: 49 ml Impression Exercise Capacity: Lexiscan with low level exercise. BP Response: Normal blood pressure response. Clinical Symptoms: No significant symptoms noted. ECG Impression: No significant ST segment change suggestive of ischemia. Comparison with Prior Nuclear Study: No images to compare Overall Impression: Normal stress nuclear study. LV Ejection Fraction: 57%. LV Wall Motion: NL LV Function; NL Wall Motion.  EKG:  EKG is ordered today. The ekg ordered today demonstrates Sinus bradycardia, rate 53 bpm. 1st degree AV block. LVH. Non-specific T wave abnormality.   Recent Labs: No results found for requested labs within last 8760 hours.   Lipid Panel No results found for:  CHOL, TRIG, HDL, CHOLHDL, VLDL, LDLCALC, LDLDIRECT   Wt Readings from Last 3 Encounters:  11/28/16 183 lb 6.4 oz (83.2 kg)  03/14/16 188 lb 12.8 oz (85.6 kg)  02/21/15 186 lb (84.4 kg)     Other studies Reviewed: Additional studies/ records that were reviewed today include: . Review of the above records demonstrates:    Assessment and Plan:   1. CAD without angina: He is having dyspnea when climbing stairs. Cannot exclude this an anginal equivalent. Last cath in 2008 with 3 patent bypass grafts. EKG unchanged. No chest pain. Will arrange exercise nuclear stress test to exclude ischemia. Will continue current therapy with ASA/statin/beta blocker.   2. Hyperlipidemia: Continue statin. Lipids controlled per pt in primary care.   3. Mitral regurgitation: moderate by echo July 2016. Repeat echo now given dyspnea.   4. Dyspnea on exertion: See  above. Will arrange echo and stress test.    Current medicines are reviewed at length with the patient today.  The patient does not have concerns regarding medicines.  The following changes have been made:  no change  Labs/ tests ordered today include:   Orders Placed This Encounter  Procedures  . Myocardial Perfusion Imaging  . EKG 12-Lead  . ECHOCARDIOGRAM COMPLETE    Disposition:   FU with me in 3-4 weeks  Signed, Lauree Chandler, MD 11/28/2016 4:49 PM    [facility] [address], [place], [place]  [address] Phone: [phone]; Fax: [phone]

## 2016-12-18 ENCOUNTER — Telehealth (HOSPITAL_COMMUNITY): Payer: Self-pay | Admitting: *Deleted

## 2016-12-18 NOTE — Telephone Encounter (Signed)
Left message with patients wife in reference to upcoming appointment scheduled for 12/20/16. Phone number given for a call back so details instructions can be given. William Pierce, [name]

## 2016-12-18 NOTE — Telephone Encounter (Signed)
Patient given detailed instructions per Myocardial Perfusion Study Information Sheet for the test on 12/20/16 at 9:45. Patient notified to arrive 15 minutes early and that it is imperative to arrive on time for appointment to keep from having the test rescheduled.  If you need to cancel or reschedule your appointment, please call the office within 24 hours of your appointment. Failure to do so may result in a cancellation of your appointment, and a $50 no show fee. Patient verbalized understanding.William Pierce

## 2016-12-20 ENCOUNTER — Other Ambulatory Visit: Payer: Self-pay

## 2016-12-20 ENCOUNTER — Ambulatory Visit (HOSPITAL_COMMUNITY): Payer: Medicare HMO | Attending: Cardiovascular Disease

## 2016-12-20 ENCOUNTER — Ambulatory Visit (HOSPITAL_BASED_OUTPATIENT_CLINIC_OR_DEPARTMENT_OTHER): Payer: Medicare HMO

## 2016-12-20 DIAGNOSIS — I351 Nonrheumatic aortic (valve) insufficiency: Secondary | ICD-10-CM | POA: Insufficient documentation

## 2016-12-20 DIAGNOSIS — Z951 Presence of aortocoronary bypass graft: Secondary | ICD-10-CM | POA: Diagnosis not present

## 2016-12-20 DIAGNOSIS — R0600 Dyspnea, unspecified: Secondary | ICD-10-CM

## 2016-12-20 DIAGNOSIS — I2510 Atherosclerotic heart disease of native coronary artery without angina pectoris: Secondary | ICD-10-CM

## 2016-12-20 DIAGNOSIS — R0609 Other forms of dyspnea: Secondary | ICD-10-CM | POA: Diagnosis not present

## 2016-12-20 DIAGNOSIS — I10 Essential (primary) hypertension: Secondary | ICD-10-CM | POA: Insufficient documentation

## 2016-12-20 DIAGNOSIS — I059 Rheumatic mitral valve disease, unspecified: Secondary | ICD-10-CM | POA: Diagnosis present

## 2016-12-20 DIAGNOSIS — R9439 Abnormal result of other cardiovascular function study: Secondary | ICD-10-CM | POA: Insufficient documentation

## 2016-12-20 DIAGNOSIS — I340 Nonrheumatic mitral (valve) insufficiency: Secondary | ICD-10-CM | POA: Insufficient documentation

## 2016-12-20 DIAGNOSIS — I071 Rheumatic tricuspid insufficiency: Secondary | ICD-10-CM | POA: Insufficient documentation

## 2016-12-20 LAB — MYOCARDIAL PERFUSION IMAGING
CHL CUP RESTING HR STRESS: 57 {beats}/min
CSEPED: 7 min
CSEPHR: 97 %
Estimated workload: 8.5 METS
Exercise duration (sec): 0 s
LV dias vol: 113 mL (ref 62–150)
LV sys vol: 49 mL
MPHR: 144 {beats}/min
Peak HR: 141 {beats}/min
RATE: 0.31
SDS: 2
SRS: 2
SSS: 4
TID: 0.95

## 2016-12-20 MED ORDER — TECHNETIUM TC 99M TETROFOSMIN IV KIT
32.2000 | PACK | Freq: Once | INTRAVENOUS | Status: AC | PRN
  Administered 2016-12-20: 32.2 via INTRAVENOUS
  Filled 2016-12-20: qty 33

## 2016-12-20 MED ORDER — TECHNETIUM TC 99M TETROFOSMIN IV KIT
10.3000 | PACK | Freq: Once | INTRAVENOUS | Status: AC | PRN
  Administered 2016-12-20: 10.3 via INTRAVENOUS
  Filled 2016-12-20: qty 11

## 2016-12-26 ENCOUNTER — Ambulatory Visit: Payer: Medicare HMO | Admitting: Cardiovascular Disease

## 2016-12-28 ENCOUNTER — Ambulatory Visit: Payer: Medicare HMO | Admitting: Cardiovascular Disease

## 2016-12-31 ENCOUNTER — Encounter: Payer: Self-pay | Admitting: *Deleted

## 2016-12-31 ENCOUNTER — Encounter: Payer: Self-pay | Admitting: Cardiovascular Disease

## 2016-12-31 ENCOUNTER — Ambulatory Visit (INDEPENDENT_AMBULATORY_CARE_PROVIDER_SITE_OTHER): Payer: Medicare HMO | Admitting: Cardiovascular Disease

## 2016-12-31 VITALS — BP 132/76 | HR 64 | Ht 70.0 in | Wt 190.8 lb

## 2016-12-31 DIAGNOSIS — I25110 Atherosclerotic heart disease of native coronary artery with unstable angina pectoris: Secondary | ICD-10-CM

## 2016-12-31 DIAGNOSIS — E7800 Pure hypercholesterolemia, unspecified: Secondary | ICD-10-CM

## 2016-12-31 DIAGNOSIS — I340 Nonrheumatic mitral (valve) insufficiency: Secondary | ICD-10-CM

## 2016-12-31 DIAGNOSIS — R0609 Other forms of dyspnea: Secondary | ICD-10-CM | POA: Diagnosis not present

## 2016-12-31 DIAGNOSIS — R0600 Dyspnea, unspecified: Secondary | ICD-10-CM

## 2016-12-31 MED ORDER — NITROGLYCERIN 0.4 MG SL SUBL: 0.4000 mg | SUBLINGUAL_TABLET | SUBLINGUAL | 6 refills | Status: DC | PRN

## 2016-12-31 NOTE — Patient Instructions (Signed)
Medication Instructions:  Your physician recommends that you continue on your current medications as directed. Please refer to the Current Medication list given to you today. A prescription for nitroglycerin has been sent to [facility] for you to use if needed.   Labwork: Lab work to be done today--BMP, CBC, PT  Testing/Procedures: Your physician has requested that you have a cardiac catheterization. Cardiac catheterization is used to diagnose and/or treat various heart conditions. Doctors may recommend this procedure for a number of different reasons. The most common reason is to evaluate chest pain. Chest pain can be a symptom of coronary artery disease (CAD), and cardiac catheterization can show whether plaque is narrowing or blocking your heart's arteries. This procedure is also used to evaluate the valves, as well as measure the blood flow and oxygen levels in different parts of your heart. For further information please visit [email or web address]. Please follow instruction sheet, as given. Scheduled for February 1,2018  Follow-Up: Your physician recommends that you schedule a follow-up appointment in: about 4 weeks with PA or NP.     Any Other Special Instructions Will Be Listed Below (If Applicable).     If you need a refill on your cardiac medications before your next appointment, please call your pharmacy.

## 2016-12-31 NOTE — Progress Notes (Signed)
Chief Complaint  Patient presents with  . Follow-up    abnormal stress test     History of Present Illness: 76 yo WM with history of CAD s/p 3V CABG 2005, HLD, mitral regurgitation, GERD, ED here today cardiac follow up. His CABG was 08/10/04 per Dr. Servando Snare. Last cath 2008 with patent SVG to intermediate, SVG to RCA and LIMA to LAD. LVEF was 55% by cath in 2008. Echo July 2016 with normal LV function, moderate MR. Stress myoview 02/02/14 with no ischemia. He has had recent dyspnea with exertion. Echo 12/20/16 with normal LV systolic function, mild AI, moderate MR. Nuclear stress test 1/11/8 with possible inferior wall ischemia, EKG changes with exercise.   He is here today for follow up. Still having dyspnea with exertion. No chest pain.   Primary Care Physician: Tivis Ringer, MD  Past Medical History:  Diagnosis Date  . Arthritis    oa  . Benign prostatic hypertrophy   . Cardiovascular disease   . Coronary artery disease   . Erectile dysfunction   . GERD (gastroesophageal reflux disease)   . Hyperlipemia   . Hypertension   . Plantar fasciitis     Past Surgical History:  Procedure Laterality Date  . CORONARY ARTERY BYPASS GRAFT  LIMA to LAD, SVG to Intermediate, SVG to PDA   2005 cabg x 3  . Hernia surgery x 2    . TOTAL HIP ARTHROPLASTY Left 01/27/2013   Procedure: TOTAL HIP ARTHROPLASTY ANTERIOR APPROACH;  Surgeon: Mauri Pole, MD;  Location: [facility];  Service: Orthopedics;  Laterality: Left;    Current Outpatient Prescriptions  Medication Sig Dispense Refill  . allopurinol (ZYLOPRIM) 300 MG tablet Take 300 mg by mouth daily.    Marland Kitchen aspirin EC 81 MG tablet Take 81 mg by mouth daily.    Mar[place] Kitchen ezetimibe (ZETIA) 10 MG tablet Take 10 mg by mouth every morning.    . fish oil-omega-3 fatty acids 1000 MG capsule Take 2 g by mouth daily.    Marland Kitchen levothyroxine (SYNTHROID, LEVOTHROID) 50 MCG tablet Take 50 mcg by mouth daily before breakfast.    . metoprolol (LOPRESSOR) 50 MG tablet  Take 25 mg by mouth 2 (two) times daily. 1/2 tab po bid    . omeprazole (PRILOSEC) 20 MG capsule Take 20 mg by mouth every Monday, Wednesday, and Friday.    . rosuvastatin (CRESTOR) 10 MG tablet Take 10 mg by mouth every morning.    . sildenafil (REVATIO) 20 MG tablet Take 20 mg by mouth daily as needed (ED).     . tamsulosin (FLOMAX) 0.4 MG CAPS capsule Take 0.4 mg by mouth daily.     No current facility-administered medications for this visit.     No Known Allergies  Social History   Social History  . Marital status: Married    Spouse name: N/A  . Number of children: N/A  . Years of education: N/A   Occupational History  . Retired-banker    Social History Main Topics  . Smoking status: Never Smoker  . Smokeless tobacco: Never Used  . Alcohol use Yes     Comment: social  . Drug use: No  . Sexual activity: Not on file   Other Topics Concern  . Not on file   Social History Narrative    Positive for coronary disease in both his father as     well as a stroke in his mother.          Family  History  Problem Relation Age of Onset  . Coronary artery disease    . Heart attack Father     Review of Systems:  As stated in the HPI and otherwise negative.   BP 132/76   Pulse 64   Ht 5\' 10"  (1.778 m)   Wt 190 lb 12.8 oz (86.5 kg)   BMI 27.38 kg/m   Physical Examination: General: Well developed, well nourished, NAD  HEENT: OP clear, mucus membranes moist  SKIN: warm, dry. No rashes. Neuro: No focal deficits  Musculoskeletal: Muscle strength 5/5 all ext  Psychiatric: Mood and affect normal  Neck: No JVD, no carotid bruits, no thyromegaly, no lymphadenopathy.  Lungs:Clear bilaterally, no wheezes, rhonci, crackles Cardiovascular: Regular rate and rhythm. No murmurs, gallops or rubs. Abdomen:Soft. Bowel sounds present. Non-tender.  Extremities: No lower extremity edema. Pulses are 2 + in the bilateral DP/PT.  Echo 12/20/16: Left ventricle: The cavity size was normal.  Wall thickness was   normal. Systolic function was normal. The estimated ejection   fraction was in the range of 60% to 65%. - Aortic valve: There was mild regurgitation. - Mitral valve: There was moderate regurgitation. - Right atrium: The atrium was mildly dilated. - Tricuspid valve: There was mild-moderate regurgitation.  Stress myoview 12/20/16:  Nuclear stress EF: 56%.  Horizontal ST segment depression ST segment depression of 2 mm was noted during stress in the aVF, III, II, V5 and V6 leads.  Blood pressure demonstrated a hypertensive response to exercise.  Defect 1: There is a small defect of mild severity present in the basal inferior location.  Findings consistent with ischemia.  This is a low risk study.   Low risk stress nuclear study with mild inferior wall ischemia and normal left ventricular regional and global systolic function, but with abnormal ECG response. The area of ischemia is new from 2015, but is very mild and small. The ECG changes during exercise are quite striking, disproportionate to the small area of ischemia, and may have alternative explanations (e.g. LVH, "false positive" in a patient with previous CABG).  Depending on the clinical situation, further evaluation with invasive angiography may be justified.  EKG:  EKG is not ordered today.  The ekg ordered today demonstrates   Recent Labs: No results found for requested labs within last 8760 hours.   Lipid Panel No results found for: CHOL, TRIG, HDL, CHOLHDL, VLDL, LDLCALC, LDLDIRECT   Wt Readings from Last 3 Encounters:  12/31/16 190 lb 12.8 oz (86.5 kg)  12/20/16 183 lb (83 kg)  11/28/16 183 lb 6.4 oz (83.2 kg)     Other studies Reviewed: Additional studies/ records that were reviewed today include: . Review of the above records demonstrates:    Assessment and Plan:   1. CAD s/p CABG with unstable angina: He is having dyspnea when climbing stairs. Cannot exclude this an anginal  equivalent. Last cath in 2008 with 3 patent bypass grafts. Nuclear stress test with possible ischemia in the inferior wall. EKG changes notes with exercise. Will arrange cardiac cath at [facility]10[facility]17. Risks and benefits of procedure reviewed with the patient. Pre-cath labs today. Will continue current therapy with ASA/statin/beta blocker.   2. Hyperlipidemia: Continue statin. Lipids controlled per pt in primary care.   3. Mitral regurgitation: moderate by echo January 2018.     Current medicines are reviewed at length with the patient today.  The patient does not have concerns regarding medicines.  The following changes have been made:  no change  Labs/ tests ordered today include:   No orders of the defined types were placed in this encounter.   Disposition:   FU with me in 3-4 weeks  Signed, Lauree Chandler, MD 12/31/2016 10:49 AM    [facility] [place], [place], [place]  [address] Phone: [phone]; Fax: [phone]

## 2017-01-01 LAB — CBC WITH DIFFERENTIAL/PLATELET
BASOS ABS: 0 10*3/uL (ref 0.0–0.2)
Basos: 0 %
EOS (ABSOLUTE): 0.3 10*3/uL (ref 0.0–0.4)
Eos: 4 %
Hematocrit: 44.4 % (ref 37.5–51.0)
Hemoglobin: 14.6 g/dL (ref 13.0–17.7)
IMMATURE GRANS (ABS): 0 10*3/uL (ref 0.0–0.1)
IMMATURE GRANULOCYTES: 0 %
LYMPHS: 32 %
Lymphocytes Absolute: 2.3 10*3/uL (ref 0.7–3.1)
MCH: 30.5 pg (ref 26.6–33.0)
MCHC: 32.9 g/dL (ref 31.5–35.7)
MCV: 93 fL (ref 79–97)
MONOS ABS: 0.7 10*3/uL (ref 0.1–0.9)
Monocytes: 9 %
NEUTROS PCT: 55 %
Neutrophils Absolute: 3.8 10*3/uL (ref 1.4–7.0)
PLATELETS: 170 10*3/uL (ref 150–379)
RBC: 4.79 x10E6/uL (ref 4.14–5.80)
RDW: 14.1 % (ref 12.3–15.4)
WBC: 7.1 10*3/uL (ref 3.4–10.8)

## 2017-01-01 LAB — BASIC METABOLIC PANEL
BUN/Creatinine Ratio: 14 (ref 10–24)
BUN: 16 mg/dL (ref 8–27)
CALCIUM: 9.4 mg/dL (ref 8.6–10.2)
CHLORIDE: 101 mmol/L (ref 96–106)
CO2: 25 mmol/L (ref 18–29)
Creatinine, Ser: 1.11 mg/dL (ref 0.76–1.27)
GFR calc Af Amer: 74 mL/min/{1.73_m2} (ref >59)
GFR, EST NON AFRICAN AMERICAN: 64 mL/min/{1.73_m2} (ref >59)
GLUCOSE: 94 mg/dL (ref 65–99)
POTASSIUM: 4.4 mmol/L (ref 3.5–5.2)
SODIUM: 140 mmol/L (ref 134–144)

## 2017-01-01 LAB — PROTIME-INR
INR: 1 (ref 0.8–1.2)
Prothrombin Time: 10.7 s (ref 9.1–12.0)

## 2017-01-08 ENCOUNTER — Telehealth: Payer: Self-pay | Admitting: Cardiovascular Disease

## 2017-01-08 NOTE — Telephone Encounter (Signed)
Patient advised to have minimal activity for the first 2 days after his cath procedure. Patient advised to not lift anything greater than 10 lbs for about 5 days. Patient advised to gradually increase his activity 5-7 days after his procedure and taking care not to over exert himself too soon. Patient verbalized understanding and was in agreement with this plan.

## 2017-01-08 NOTE — Telephone Encounter (Signed)
New message  Pt call requesting to speak with RN. Pt would like to know if he would be able to play tennis after his cath procedure. Please call back to discuss

## 2017-01-10 ENCOUNTER — Encounter (HOSPITAL_COMMUNITY)
Admission: RE | Disposition: A | Discharge status: Home / Self Care | Payer: Self-pay | Source: Ambulatory Visit | Attending: Cardiovascular Disease

## 2017-01-10 ENCOUNTER — Encounter (HOSPITAL_COMMUNITY): Payer: Self-pay

## 2017-01-10 ENCOUNTER — Ambulatory Visit (HOSPITAL_COMMUNITY)
Admission: RE | Admit: 2017-01-10 | Discharge: 2017-01-10 | Disposition: A | Discharge status: Home / Self Care | Payer: Medicare HMO | Source: Ambulatory Visit | Attending: Cardiovascular Disease | Admitting: Cardiovascular Disease

## 2017-01-10 DIAGNOSIS — K219 Gastro-esophageal reflux disease without esophagitis: Secondary | ICD-10-CM | POA: Insufficient documentation

## 2017-01-10 DIAGNOSIS — N400 Enlarged prostate without lower urinary tract symptoms: Secondary | ICD-10-CM | POA: Diagnosis not present

## 2017-01-10 DIAGNOSIS — Z96642 Presence of left artificial hip joint: Secondary | ICD-10-CM | POA: Insufficient documentation

## 2017-01-10 DIAGNOSIS — R0609 Other forms of dyspnea: Secondary | ICD-10-CM

## 2017-01-10 DIAGNOSIS — I2510 Atherosclerotic heart disease of native coronary artery without angina pectoris: Secondary | ICD-10-CM | POA: Diagnosis not present

## 2017-01-10 DIAGNOSIS — I10 Essential (primary) hypertension: Secondary | ICD-10-CM | POA: Insufficient documentation

## 2017-01-10 DIAGNOSIS — Z7982 Long term (current) use of aspirin: Secondary | ICD-10-CM | POA: Insufficient documentation

## 2017-01-10 DIAGNOSIS — E785 Hyperlipidemia, unspecified: Secondary | ICD-10-CM | POA: Insufficient documentation

## 2017-01-10 DIAGNOSIS — I340 Nonrheumatic mitral (valve) insufficiency: Secondary | ICD-10-CM | POA: Insufficient documentation

## 2017-01-10 DIAGNOSIS — Z79899 Other long term (current) drug therapy: Secondary | ICD-10-CM | POA: Insufficient documentation

## 2017-01-10 DIAGNOSIS — Z951 Presence of aortocoronary bypass graft: Secondary | ICD-10-CM | POA: Insufficient documentation

## 2017-01-10 DIAGNOSIS — R9439 Abnormal result of other cardiovascular function study: Secondary | ICD-10-CM

## 2017-01-10 DIAGNOSIS — R0600 Dyspnea, unspecified: Secondary | ICD-10-CM

## 2017-01-10 HISTORY — PX: CARDIAC CATHETERIZATION: SHX172

## 2017-01-10 SURGERY — LEFT HEART CATH AND CORS/GRAFTS ANGIOGRAPHY

## 2017-01-10 MED ORDER — HEPARIN SODIUM (PORCINE) 1000 UNIT/ML IJ SOLN
INTRAMUSCULAR | Status: DC | PRN
  Administered 2017-01-10: 4000 [IU] via INTRAVENOUS

## 2017-01-10 MED ORDER — SODIUM CHLORIDE 0.9 % IV SOLN: INTRAVENOUS | Status: AC

## 2017-01-10 MED ORDER — IOPAMIDOL (ISOVUE-370) INJECTION 76%
INTRAVENOUS | Status: AC
  Filled 2017-01-10: qty 125

## 2017-01-10 MED ORDER — VERAPAMIL HCL 2.5 MG/ML IV SOLN
INTRAVENOUS | Status: DC | PRN
  Administered 2017-01-10: 10 mL via INTRA_ARTERIAL

## 2017-01-10 MED ORDER — FENTANYL CITRATE (PF) 100 MCG/2ML IJ SOLN
INTRAMUSCULAR | Status: AC
  Filled 2017-01-10: qty 2

## 2017-01-10 MED ORDER — LIDOCAINE HCL (PF) 1 % IJ SOLN
INTRAMUSCULAR | Status: DC | PRN
  Administered 2017-01-10: 2 mL

## 2017-01-10 MED ORDER — MIDAZOLAM HCL 2 MG/2ML IJ SOLN
INTRAMUSCULAR | Status: DC | PRN
  Administered 2017-01-10: 2 mg via INTRAVENOUS

## 2017-01-10 MED ORDER — IOPAMIDOL (ISOVUE-370) INJECTION 76%
INTRAVENOUS | Status: DC | PRN
  Administered 2017-01-10: 110 mL via INTRA_ARTERIAL

## 2017-01-10 MED ORDER — HEPARIN (PORCINE) IN NACL 2-0.9 UNIT/ML-% IJ SOLN
INTRAMUSCULAR | Status: DC | PRN
  Administered 2017-01-10: 1000 mL

## 2017-01-10 MED ORDER — SODIUM CHLORIDE 0.9% FLUSH: 3.0000 mL | Freq: Two times a day (BID) | INTRAVENOUS | Status: DC

## 2017-01-10 MED ORDER — HEPARIN (PORCINE) IN NACL 2-0.9 UNIT/ML-% IJ SOLN
INTRAMUSCULAR | Status: AC
  Filled 2017-01-10: qty 1000

## 2017-01-10 MED ORDER — SODIUM CHLORIDE 0.9 % IV SOLN: 250.0000 mL | INTRAVENOUS | Status: DC | PRN

## 2017-01-10 MED ORDER — SODIUM CHLORIDE 0.9% FLUSH: 3.0000 mL | INTRAVENOUS | Status: DC | PRN

## 2017-01-10 MED ORDER — VERAPAMIL HCL 2.5 MG/ML IV SOLN
INTRAVENOUS | Status: AC
  Filled 2017-01-10: qty 2

## 2017-01-10 MED ORDER — FENTANYL CITRATE (PF) 100 MCG/2ML IJ SOLN
INTRAMUSCULAR | Status: DC | PRN
  Administered 2017-01-10: 50 ug via INTRAVENOUS

## 2017-01-10 MED ORDER — LIDOCAINE HCL (PF) 1 % IJ SOLN
INTRAMUSCULAR | Status: AC
  Filled 2017-01-10: qty 30

## 2017-01-10 MED ORDER — MIDAZOLAM HCL 2 MG/2ML IJ SOLN
INTRAMUSCULAR | Status: AC
  Filled 2017-01-10: qty 2

## 2017-01-10 MED ORDER — SODIUM CHLORIDE 0.9 % IV SOLN
INTRAVENOUS | Status: AC
  Administered 2017-01-10: 08:00:00 via INTRAVENOUS

## 2017-01-10 MED ORDER — ASPIRIN 81 MG PO CHEW: 81.0000 mg | CHEWABLE_TABLET | ORAL | Status: DC

## 2017-01-10 MED ORDER — ASPIRIN 81 MG PO CHEW
CHEWABLE_TABLET | ORAL | Status: AC
  Administered 2017-01-10: 81 mg
  Filled 2017-01-10: qty 1

## 2017-01-10 MED ORDER — HEPARIN SODIUM (PORCINE) 1000 UNIT/ML IJ SOLN
INTRAMUSCULAR | Status: AC
  Filled 2017-01-10: qty 1

## 2017-01-10 SURGICAL SUPPLY — 12 items
CATH INFINITI 5 FR IM (CATHETERS) ×1 IMPLANT
CATH INFINITI 5FR MULTPACK ANG (CATHETERS) ×1 IMPLANT
COVER PRB 48X5XTLSCP FOLD TPE (BAG) IMPLANT
COVER PROBE 5X48 (BAG) ×2
DEVICE RAD COMP TR BAND LRG (VASCULAR PRODUCTS) ×1 IMPLANT
GLIDESHEATH SLEND SS 6F .021 (SHEATH) ×1 IMPLANT
GUIDEWIRE INQWIRE 1.5J.035X260 (WIRE) IMPLANT
INQWIRE 1.5J .035X260CM (WIRE) ×2
KIT HEART LEFT (KITS) ×2 IMPLANT
PACK CARDIAC CATHETERIZATION (CUSTOM PROCEDURE TRAY) ×2 IMPLANT
TRANSDUCER W/STOPCOCK (MISCELLANEOUS) ×2 IMPLANT
TUBING CIL FLEX 10 FLL-RA (TUBING) ×2 IMPLANT

## 2017-01-10 NOTE — Discharge Instructions (Signed)

## 2017-01-10 NOTE — Interval H&P Note (Signed)
History and Physical Interval Note:  01/10/2017 7:45 AM  William Pierce  has presented today for cardiac cath with the diagnosis of unstable angina/abnormal stress test  The various methods of treatment have been discussed with the patient and family. After consideration of risks, benefits and other options for treatment, the patient has consented to  Procedure(s): Left Heart Cath and Cors/Grafts Angiography (N/A) as a surgical intervention .  The patient's history has been reviewed, patient examined, no change in status, stable for surgery.  I have reviewed the patient's chart and labs.  Questions were answered to the patient's satisfaction.  Cath Lab Visit (complete for each Cath Lab visit)  Clinical Evaluation Leading to the Procedure:   ACS: No.  Non-ACS:    Anginal Classification: CCS II  Anti-ischemic medical therapy: Minimal Therapy (1 class of medications)  Non-Invasive Test Results: Low-risk stress test findings: cardiac mortality <1%/year  Prior CABG: Previous CABG           Lauree Chandler

## 2017-01-10 NOTE — H&P (View-Only) (Signed)
Chief Complaint  Patient presents with  . Follow-up    abnormal stress test     History of Present Illness: 76 yo WM with history of CAD s/p 3V CABG 2005, HLD, mitral regurgitation, GERD, ED here today cardiac follow up. His CABG was 08/10/04 per Dr. Servando Snare. Last cath 2008 with patent SVG to intermediate, SVG to RCA and LIMA to LAD. LVEF was 55% by cath in 2008. Echo July 2016 with normal LV function, moderate MR. Stress myoview 02/02/14 with no ischemia. He has had recent dyspnea with exertion. Echo 12/20/16 with normal LV systolic function, mild AI, moderate MR. Nuclear stress test 1/11/8 with possible inferior wall ischemia, EKG changes with exercise.   He is here today for follow up. Still having dyspnea with exertion. No chest pain.   Primary Care Physician: Tivis Ringer, MD  Past Medical History:  Diagnosis Date  . Arthritis    oa  . Benign prostatic hypertrophy   . Cardiovascular disease   . Coronary artery disease   . Erectile dysfunction   . GERD (gastroesophageal reflux disease)   . Hyperlipemia   . Hypertension   . Plantar fasciitis     Past Surgical History:  Procedure Laterality Date  . CORONARY ARTERY BYPASS GRAFT  LIMA to LAD, SVG to Intermediate, SVG to PDA   2005 cabg x 3  . Hernia surgery x 2    . TOTAL HIP ARTHROPLASTY Left 01/27/2013   Procedure: TOTAL HIP ARTHROPLASTY ANTERIOR APPROACH;  Surgeon: Mauri Pole, MD;  Location: [facility];  Service: Orthopedics;  Laterality: Left;    Current Outpatient Prescriptions  Medication Sig Dispense Refill  . allopurinol (ZYLOPRIM) 300 MG tablet Take 300 mg by mouth daily.    Marland Kitchen aspirin EC 81 MG tablet Take 81 mg by mouth daily.    Marland Kitchen ezetimibe (ZETIA) 10 MG tablet Take 10 mg by mouth every morning.    . fish oil-omega-3 fatty acids 1000 MG capsule Take 2 g by mouth daily.    Marland Kitchen levothyroxine (SYNTHROID, LEVOTHROID) 50 MCG tablet Take 50 mcg by mouth daily before breakfast.    . metoprolol (LOPRESSOR) 50 MG tablet  Take 25 mg by mouth 2 (two) times daily. 1/2 tab po bid    . omeprazole (PRILOSEC) 20 MG capsule Take 20 mg by mouth every Monday, Wednesday, and Friday.    . rosuvastatin (CRESTOR) 10 MG tablet Take 10 mg by mouth every morning.    . sildenafil (REVATIO) 20 MG tablet Take 20 mg by mouth daily as needed (ED).     . tamsulosin (FLOMAX) 0.4 MG CAPS capsule Take 0.4 mg by mouth daily.     No current facility-administered medications for this visit.     No Known Allergies  Social History   Social History  . Marital status: Married    Spouse name: N/A  . Number of children: N/A  . Years of education: N/A   Occupational History  . Retired-banker    Social History Main Topics  . Smoking status: Never Smoker  . Smokeless tobacco: Never Used  . Alcohol use Yes     Comment: social  . Drug use: No  . Sexual activity: Not on file   Other Topics Concern  . Not on file   Social History Narrative    Positive for coronary disease in both his father as     well as a stroke in his mother.          Family  History  Problem Relation Age of Onset  . Coronary artery disease    . Heart attack Father     Review of Systems:  As stated in the HPI and otherwise negative.   BP 132/76   Pulse 64   Ht 5\' 10"  (1.778 m)   Wt 190 lb 12.8 oz (86.5 kg)   BMI 27.38 kg/m   Physical Examination: General: Well developed, well nourished, NAD  HEENT: OP clear, mucus membranes moist  SKIN: warm, dry. No rashes. Neuro: No focal deficits  Musculoskeletal: Muscle strength 5/5 all ext  Psychiatric: Mood and affect normal  Neck: No JVD, no carotid bruits, no thyromegaly, no lymphadenopathy.  Lungs:Clear bilaterally, no wheezes, rhonci, crackles Cardiovascular: Regular rate and rhythm. No murmurs, gallops or rubs. Abdomen:Soft. Bowel sounds present. Non-tender.  Extremities: No lower extremity edema. Pulses are 2 + in the bilateral DP/PT.  Echo 12/20/16: Left ventricle: The cavity size was normal.  Wall thickness was   normal. Systolic function was normal. The estimated ejection   fraction was in the range of 60% to 65%. - Aortic valve: There was mild regurgitation. - Mitral valve: There was moderate regurgitation. - Right atrium: The atrium was mildly dilated. - Tricuspid valve: There was mild-moderate regurgitation.  Stress myoview 12/20/16:  Nuclear stress EF: 56%.  Horizontal ST segment depression ST segment depression of 2 mm was noted during stress in the aVF, III, II, V5 and V6 leads.  Blood pressure demonstrated a hypertensive response to exercise.  Defect 1: There is a small defect of mild severity present in the basal inferior location.  Findings consistent with ischemia.  This is a low risk study.   Low risk stress nuclear study with mild inferior wall ischemia and normal left ventricular regional and global systolic function, but with abnormal ECG response. The area of ischemia is new from 2015, but is very mild and small. The ECG changes during exercise are quite striking, disproportionate to the small area of ischemia, and may have alternative explanations (e.g. LVH, "false positive" in a patient with previous CABG).  Depending on the clinical situation, further evaluation with invasive angiography may be justified.  EKG:  EKG is not ordered today.  The ekg ordered today demonstrates   Recent Labs: No results found for requested labs within last 8760 hours.   Lipid Panel No results found for: CHOL, TRIG, HDL, CHOLHDL, VLDL, LDLCALC, LDLDIRECT   Wt Readings from Last 3 Encounters:  12/31/16 190 lb 12.8 oz (86.5 kg)  12/20/16 183 lb (83 kg)  11/28/16 183 lb 6.4 oz (83.2 kg)     Other studies Reviewed: Additional studies/ records that were reviewed today include: . Review of the above records demonstrates:    Assessment and Plan:   1. CAD s/p CABG with unstable angina: He is having dyspnea when climbing stairs. Cannot exclude this an anginal  equivalent. Last cath in 2008 with 3 patent bypass grafts. Nuclear stress test with possible ischemia in the inferior wall. EKG changes notes with exercise. Will arrange cardiac cath at [facility] 01/10/17. Risks and benefits of procedure reviewed with the patient. Pre-cath labs today. Will continue current therapy with ASA/statin/beta blocker.   2. Hyperlipidemia: Continue statin. Lipids controlled per pt in primary care.   3. Mitral regurgitation: moderate by echo January 2018.     Current medicines are reviewed at length with the patient today.  The patient does not have concerns regarding medicines.  The following changes have been made:  no change  Labs/ tests ordered today include:   No orders of the defined types were placed in this encounter.   Disposition:   FU with me in 3-4 weeks  Signed, Lauree Chandler, MD 12/31/2016 10:49 AM    [facility] [address] [place] [place], [place], [place]  [address] Phone: [phone]; Fax: [phone]

## 2017-01-15 DIAGNOSIS — M1990 Unspecified osteoarthritis, unspecified site: Secondary | ICD-10-CM | POA: Diagnosis not present

## 2017-01-15 DIAGNOSIS — Z1389 Encounter for screening for other disorder: Secondary | ICD-10-CM | POA: Diagnosis not present

## 2017-01-15 DIAGNOSIS — I2510 Atherosclerotic heart disease of native coronary artery without angina pectoris: Secondary | ICD-10-CM | POA: Diagnosis not present

## 2017-01-15 DIAGNOSIS — N183 Chronic kidney disease, stage 3 (moderate): Secondary | ICD-10-CM | POA: Diagnosis not present

## 2017-01-15 DIAGNOSIS — I129 Hypertensive chronic kidney disease with stage 1 through stage 4 chronic kidney disease, or unspecified chronic kidney disease: Secondary | ICD-10-CM | POA: Diagnosis not present

## 2017-01-15 DIAGNOSIS — Z6826 Body mass index (BMI) 26.0-26.9, adult: Secondary | ICD-10-CM | POA: Diagnosis not present

## 2017-01-15 DIAGNOSIS — E038 Other specified hypothyroidism: Secondary | ICD-10-CM | POA: Diagnosis not present

## 2017-01-28 ENCOUNTER — Encounter: Payer: Self-pay | Admitting: Cardiology

## 2017-02-07 ENCOUNTER — Ambulatory Visit: Payer: Medicare HMO | Admitting: Cardiology

## 2017-02-25 ENCOUNTER — Encounter: Payer: Self-pay | Admitting: Physician Assistant

## 2017-02-25 ENCOUNTER — Ambulatory Visit (INDEPENDENT_AMBULATORY_CARE_PROVIDER_SITE_OTHER): Payer: Medicare HMO | Admitting: Physician Assistant

## 2017-02-25 VITALS — BP 112/60 | HR 59 | Ht 70.0 in | Wt 187.7 lb

## 2017-02-25 DIAGNOSIS — E785 Hyperlipidemia, unspecified: Secondary | ICD-10-CM | POA: Diagnosis not present

## 2017-02-25 DIAGNOSIS — I10 Essential (primary) hypertension: Secondary | ICD-10-CM | POA: Diagnosis not present

## 2017-02-25 DIAGNOSIS — I2510 Atherosclerotic heart disease of native coronary artery without angina pectoris: Secondary | ICD-10-CM

## 2017-02-25 NOTE — Patient Instructions (Signed)
Medication Instructions:   Your physician recommends that you continue on your current medications as directed. Please refer to the Current Medication list given to you today.   If you need a refill on your cardiac medications before your next appointment, please call your pharmacy.  Labwork: NONE ORDERED  TODAY    Testing/Procedures: NONE ORDERED  TODAY    Follow-Up:  Your physician wants you to follow-up in:  IN  6  MONTHS WITH DR MCALHANY   You will receive a reminder letter in the mail two months in advance. If you don't receive a letter, please call our office to schedule the follow-up appointment.     Any Other Special Instructions Will Be Listed Below (If Applicable).                                                                                                                                                   

## 2017-02-25 NOTE — Progress Notes (Signed)
Cardiology Office Note    Date:  02/25/2017   ID:  William Pierce, DOB 1941-10-27, MRN [id number]  PCP:  Tivis Ringer, MD  Cardiologist: Dr. Angelena Form  Chief Complaint  Patient presents with  . Follow-up    History of Present Illness:  William Pierce is a 76 y.o. male with history of CAD s/p 3V CABG 2005, HLD, mitral regurgitation, GERD.cath 2008 with patent SVG to intermediate, SVG to RCA and LIMA to LAD. LVEF was 55%.Echo July 2016 with normal LV function, moderate MR. Stress myoview 02/02/14 with no ischemia. He has had recent dyspnea with exertion. Echo 12/20/16 with normal LV systolic function, mild AI, moderate MR. Nuclear stress test 1/11/8 with possible inferior wall ischemia, EKG changes with exercise. Symptoms of dyspnea on exertion but no chest pain. Cardiac cath 01/10/17 showed triple vessel CAD with 3/3 patent bypass grafts, medical therapy recommended.  Patient comes in today feeling quite well. He is playing tennis 2 days a week and golfing. He has not had any more dyspnea on exertion. He denies chest pain.          Past Medical History:  Diagnosis Date  . Arthritis    oa  . Benign prostatic hypertrophy   . Cardiovascular disease   . Coronary artery disease   . Erectile dysfunction   . GERD (gastroesophageal reflux disease)   . Hyperlipemia   . Hypertension   . Plantar fasciitis     Past Surgical History:  Procedure Laterality Date  . CARDIAC CATHETERIZATION N/A 01/10/2017   Procedure: Left Heart Cath and Cors/Grafts Angiography;  Surgeon: Burnell Blanks, MD;  Location: [facility];  Service: Cardiovascular;  Laterality: N/A;  . CORONARY ARTERY BYPASS GRAFT  LIMA to LAD, SVG to Intermediate, SVG to PDA   2005 cabg x 3  . Hernia surgery x 2    . TOTAL HIP ARTHROPLASTY Left 01/27/2013   Procedure: TOTAL HIP ARTHROPLASTY ANTERIOR APPROACH;  Surgeon: Mauri Pole, MD;  Location: [facility];  Service: Orthopedics;  Laterality: Left;     Current Medications: Outpatient Medications Prior to Visit  Medication Sig Dispense Refill  . allopurinol (ZYLOPRIM) 300 MG tablet Take 300 mg by mouth daily.    Marland Kitchen aspirin EC 81 MG tablet Take 81 mg by mouth once a week.     . Cholecalciferol (VITAMIN D3) 1000 units CAPS Take 1,000 Units by mouth daily.    . diphenhydrAMINE (BENADRYL) 25 MG tablet Take 25 mg by mouth daily as needed for allergies.    Marland Kitchen ezetimibe (ZETIA) 10 MG tablet Take 10 mg by mouth every morning.    . fish oil-omega-3 fatty acids 1000 MG capsule Take 1 g by mouth 2 (two) times daily.     Marland Kitchen ibuprofen (ADVIL,MOTRIN) 200 MG tablet Take 400 mg by mouth 2 (two) times daily as needed for moderate pain.    Marland Kitchen levothyroxine (SYNTHROID, LEVOTHROID) 50 MCG tablet Take 50 mcg by mouth daily before breakfast.    . metoprolol (LOPRESSOR) 50 MG tablet Take 25 mg by mouth 2 (two) times daily.     . nitroGLYCERIN (NITROSTAT) 0.4 MG SL tablet Place 1 tablet (0.4 mg total) under the tongue every 5 (five) minutes as needed for chest pain. 25 tablet 6  . omeprazole (PRILOSEC) 20 MG capsule Take 20 mg by mouth every Monday, Wednesday, and Friday.    . rosuvastatin (CRESTOR) 10 MG tablet Take 10 mg by mouth every morning.    Marland Kitchen  sildenafil (REVATIO) 20 MG tablet Take 20 mg by mouth daily as needed (ED).     . tamsulosin (FLOMAX) 0.4 MG CAPS capsule Take 0.4 mg by mouth daily.     No facility-administered medications prior to visit.      Allergies:   Patient has no known allergies.   Social History   Social History  . Marital status: Married    Spouse name: N/A  . Number of children: N/A  . Years of education: N/A   Occupational History  . Retired-banker    Social History Main Topics  . Smoking status: Never Smoker  . Smokeless tobacco: Never Used  . Alcohol use Yes     Comment: social  . Drug use: No  . Sexual activity: Not Asked   Other Topics Concern  . None   Social History Narrative    Positive for coronary disease  in both his father as     well as a stroke in his mother.           Family History:  The patient's family history includes Heart attack in his father.   ROS:   Please see the history of present illness.    Review of Systems  Constitution: Negative.  HENT: Negative.   Cardiovascular: Negative.   Respiratory: Negative.   Endocrine: Negative.   Hematologic/Lymphatic: Negative.   Musculoskeletal: Negative.   Gastrointestinal: Negative.   Genitourinary: Negative.   Neurological: Negative.    All other systems reviewed and are negative.   PHYSICAL EXAM:   VS:  BP 112/60   Pulse (!) 59   Ht 5\' 10"  (1.778 m)   Wt 187 lb 11.2 oz (85.1 kg)   BMI 26.93 kg/m   Physical Exam  GEN: Well nourished, well developed, in no acute distress  Neck: no JVD, carotid bruits, or masses Cardiac:RRR; positive S4 and 1/6 systolic murmur at the left sternal border Respiratory:  clear to auscultation bilaterally, normal work of breathing GI: soft, nontender, nondistended, + BS Ext: left arm at cath site without hematoma or hemorrhage good radial brachial pulses, lower extremitieswithout cyanosis, clubbing, or edema, Good distal pulses bilaterally Psych: euthymic mood, full affect  Wt Readings from Last 3 Encounters:  02/25/17 187 lb 11.2 oz (85.1 kg)  01/10/17 182 lb (82.6 kg)  12/31/16 190 lb 12.8 oz (86.5 kg)      Studies/Labs Reviewed:   EKG:  EKG is not ordered today.    Recent Labs: 12/31/2016: BUN 16; Creatinine, Ser 1.11; Platelets 170; Potassium 4.4; Sodium 140   Lipid Panel No results found for: CHOL, TRIG, HDL, CHOLHDL, VLDL, LDLCALC, LDLDIRECT  Additional studies/ records that were reviewed today include:   Cardiac catheterization 2/1/18Procedures  Left Heart Cath and Cors/Grafts Angiography  Conclusion     Mid RCA lesion, 70 %stenosed.  SVG graft was visualized by angiography and is normal in caliber and anatomically normal.  Ost Ramus to Ramus lesion, 100  %stenosed.  SVG due to known occlusion and is normal in caliber and anatomically normal.  Mid LAD lesion, 50 %stenosed.  LIMA graft was visualized by angiography and is normal in caliber and anatomically normal.  Ost LM lesion, 70 %stenosed.   1. Triple vessel CAD with 3/3 patent bypass grafts     Recommendations: Continue medical management of CAD.      Echo 12/20/16: Left ventricle: The cavity size was normal. Wall thickness was   normal. Systolic function was normal. The estimated ejection   fraction was  in the range of 60% to 65%. - Aortic valve: There was mild regurgitation. - Mitral valve: There was moderate regurgitation. - Right atrium: The atrium was mildly dilated. - Tricuspid valve: There was mild-moderate regurgitation.   Stress myoview 12/20/16:  Nuclear stress EF: 56%.  Horizontal ST segment depression ST segment depression of 2 mm was noted during stress in the aVF, III, II, V5 and V6 leads.  Blood pressure demonstrated a hypertensive response to exercise.  Defect 1: There is a small defect of mild severity present in the basal inferior location.  Findings consistent with ischemia.  This is a low risk study.   Low risk stress nuclear study with mild inferior wall ischemia and normal left ventricular regional and global systolic function, but with abnormal ECG response. The area of ischemia is new from 2015, but is very mild and small. The ECG changes during exercise are quite striking, disproportionate to the small area of ischemia, and may have alternative explanations (e.g. LVH, "false positive" in a patient with previous CABG).  Depending on the clinical situation, further evaluation with invasive angiography may be justified.       ASSESSMENT:    1. Coronary artery disease involving native coronary artery of native heart without angina pectoris   2. Essential hypertension   3. Hyperlipidemia, unspecified hyperlipidemia type      PLAN:  In  order of problems listed above:  CAD status post CABG in 2008 with recent cardiac cath 01/10/17 showing 3/3 grafts patent with normal LV function. Continue medical therapy. No further dyspnea on exertion or chest pain. Follow-up with Dr.McAlhany in 6 months.  Essential hypertension controlled  Hyperlipidemia controlled with Crestor  Medication Adjustments/Labs and Tests Ordered: Current medicines are reviewed at length with the patient today.  Concerns regarding medicines are outlined above.  Medication changes, Labs and Tests ordered today are listed in the Patient Instructions below. Patient Instructions  Medication Instructions:   .Your physician recommends that you continue on your current medications as directed. Please refer to the Current Medication list given to you today.   If you need a refill on your cardiac medications before your next appointment, please call your pharmacy.  Labwork: NONE ORDERED  TODAY    Testing/Procedures: NONE ORDERED  TODAY    Follow-Up:  Your physician wants you to follow-up in:  [place] will receive a reminder letter in the mail two months in advance. If you don't receive a letter, please call our office to schedule the follow-up appointment.     Any Other Special Instructions Will Be Listed Below (If Applicable).  Sumner Boast, PA-C  02/25/2017 9:04 AM    [facility] [address], [place], [place]  [address] Phone: [phone]; Fax: [phone]

## 2017-07-23 DIAGNOSIS — R69 Illness, unspecified: Secondary | ICD-10-CM | POA: Diagnosis not present

## 2017-08-06 DIAGNOSIS — R8299 Other abnormal findings in urine: Secondary | ICD-10-CM | POA: Diagnosis not present

## 2017-08-06 DIAGNOSIS — M109 Gout, unspecified: Secondary | ICD-10-CM | POA: Diagnosis not present

## 2017-08-06 DIAGNOSIS — I10 Essential (primary) hypertension: Secondary | ICD-10-CM | POA: Diagnosis not present

## 2017-08-06 DIAGNOSIS — E784 Other hyperlipidemia: Secondary | ICD-10-CM | POA: Diagnosis not present

## 2017-08-06 DIAGNOSIS — E038 Other specified hypothyroidism: Secondary | ICD-10-CM | POA: Diagnosis not present

## 2017-08-06 DIAGNOSIS — Z125 Encounter for screening for malignant neoplasm of prostate: Secondary | ICD-10-CM | POA: Diagnosis not present

## 2017-08-13 DIAGNOSIS — M1990 Unspecified osteoarthritis, unspecified site: Secondary | ICD-10-CM | POA: Diagnosis not present

## 2017-08-13 DIAGNOSIS — E038 Other specified hypothyroidism: Secondary | ICD-10-CM | POA: Diagnosis not present

## 2017-08-13 DIAGNOSIS — N401 Enlarged prostate with lower urinary tract symptoms: Secondary | ICD-10-CM | POA: Diagnosis not present

## 2017-08-13 DIAGNOSIS — Z23 Encounter for immunization: Secondary | ICD-10-CM | POA: Diagnosis not present

## 2017-08-13 DIAGNOSIS — I2510 Atherosclerotic heart disease of native coronary artery without angina pectoris: Secondary | ICD-10-CM | POA: Diagnosis not present

## 2017-08-13 DIAGNOSIS — Z0000 Encounter for general adult medical examination without abnormal findings: Secondary | ICD-10-CM | POA: Diagnosis not present

## 2017-08-13 DIAGNOSIS — M109 Gout, unspecified: Secondary | ICD-10-CM | POA: Diagnosis not present

## 2017-08-13 DIAGNOSIS — K219 Gastro-esophageal reflux disease without esophagitis: Secondary | ICD-10-CM | POA: Diagnosis not present

## 2017-08-13 DIAGNOSIS — I129 Hypertensive chronic kidney disease with stage 1 through stage 4 chronic kidney disease, or unspecified chronic kidney disease: Secondary | ICD-10-CM | POA: Diagnosis not present

## 2017-08-13 DIAGNOSIS — E784 Other hyperlipidemia: Secondary | ICD-10-CM | POA: Diagnosis not present

## 2017-08-13 DIAGNOSIS — N183 Chronic kidney disease, stage 3 (moderate): Secondary | ICD-10-CM | POA: Diagnosis not present

## 2017-08-15 DIAGNOSIS — Z1212 Encounter for screening for malignant neoplasm of rectum: Secondary | ICD-10-CM | POA: Diagnosis not present

## 2017-08-15 LAB — IFOBT (OCCULT BLOOD): IMMUNOLOGICAL FECAL OCCULT BLOOD TEST: POSITIVE

## 2017-08-21 ENCOUNTER — Encounter: Payer: Self-pay | Admitting: Physician Assistant

## 2017-08-22 DIAGNOSIS — H6121 Impacted cerumen, right ear: Secondary | ICD-10-CM | POA: Diagnosis not present

## 2017-08-22 DIAGNOSIS — H938X2 Other specified disorders of left ear: Secondary | ICD-10-CM | POA: Diagnosis not present

## 2017-08-22 DIAGNOSIS — Z7289 Other problems related to lifestyle: Secondary | ICD-10-CM | POA: Diagnosis not present

## 2017-08-22 DIAGNOSIS — H9193 Unspecified hearing loss, bilateral: Secondary | ICD-10-CM | POA: Diagnosis not present

## 2017-08-26 ENCOUNTER — Encounter: Payer: Self-pay | Admitting: Cardiovascular Disease

## 2017-08-26 ENCOUNTER — Ambulatory Visit (INDEPENDENT_AMBULATORY_CARE_PROVIDER_SITE_OTHER): Payer: Medicare HMO | Admitting: Cardiovascular Disease

## 2017-08-26 VITALS — BP 114/60 | HR 52 | Ht 70.0 in | Wt 187.8 lb

## 2017-08-26 DIAGNOSIS — I2510 Atherosclerotic heart disease of native coronary artery without angina pectoris: Secondary | ICD-10-CM | POA: Diagnosis not present

## 2017-08-26 DIAGNOSIS — I340 Nonrheumatic mitral (valve) insufficiency: Secondary | ICD-10-CM

## 2017-08-26 DIAGNOSIS — E7800 Pure hypercholesterolemia, unspecified: Secondary | ICD-10-CM | POA: Diagnosis not present

## 2017-08-26 NOTE — Patient Instructions (Signed)

## 2017-08-26 NOTE — Progress Notes (Signed)
Chief Complaint  Patient presents with  . Follow-up    CAD     History of Present Illness: 76 yo WM with history of CAD s/p 3V CABG 2005, HLD, mitral regurgitation, GERD, ED here today cardiac follow up. His CABG was 08/10/04. Last cath 2008 with patent SVG to intermediate, SVG to RCA and LIMA to LAD. LVEF was 55% by cath in 2008. Echo 12/20/16 with normal LV systolic function, mild AI, moderate MR. Nuclear stress test 1/11/8 with possible inferior wall ischemia, EKG changes with exercise. This was arranged due to dyspnea. Cardiac cath February 2018 with three vessel CAD with 3/3 patent bypass grafts.   He is here today for follow up of his CAD. The patient denies any chest pain, dyspnea, palpitations, lower extremity edema, orthopnea, PND, dizziness, near syncope or syncope.    Primary Care Physician: Prince Solian, MD  Past Medical History:  Diagnosis Date  . Arthritis    oa  . Benign prostatic hypertrophy   . Cardiovascular disease   . Coronary artery disease   . Erectile dysfunction   . GERD (gastroesophageal reflux disease)   . Hyperlipemia   . Hypertension   . Plantar fasciitis     Past Surgical History:  Procedure Laterality Date  . CARDIAC CATHETERIZATION N/A 01/10/2017   Procedure: Left Heart Cath and Cors/Grafts Angiography;  Surgeon: Burnell Blanks, MD;  Location: [facility];  Service: Cardiovascular;  Laterality: N/A;  . CORONARY ARTERY BYPASS GRAFT  LIMA to LAD, SVG to Intermediate, SVG to PDA   2005 cabg x 3  . Hernia surgery x 2    . TOTAL HIP ARTHROPLASTY Left 01/27/2013   Procedure: TOTAL HIP ARTHROPLASTY ANTERIOR APPROACH;  Surgeon: Mauri Pole, MD;  Location: [facility];  Service: Orthopedics;  Laterality: Left;    Current Outpatient Prescriptions  Medication Sig Dispense Refill  . allopurinol (ZYLOPRIM) 300 MG tablet Take 300 mg by mouth daily.    Marland Kitchen aspirin EC 81 MG tablet Take 81 mg by mouth once a week.     . Cholecalciferol (VITAMIN  D3) 1000 units CAPS Take 1,000 Units by mouth daily.    . diphenhydrAMINE (BENADRYL) 25 MG tablet Take 25 mg by mouth daily as needed for allergies.    Marland Kitchen ezetimibe (ZETIA) 10 MG tablet Take 10 mg by mouth every morning.    . fish oil-omega-3 fatty acids 1000 MG capsule Take 1 g by mouth 2 (two) times daily.     Marland Kitchen ibuprofen (ADVIL,MOTRIN) 200 MG tablet Take 400 mg by mouth 2 (two) times daily as needed for moderate pain.    Marland Kitchen levothyroxine (SYNTHROID, LEVOTHROID) 50 MCG tablet Take 50 mcg by mouth daily before breakfast.    . metoprolol (LOPRESSOR) 50 MG tablet Take 25 mg by mouth 2 (two) times daily.     . nitroGLYCERIN (NITROSTAT) 0.4 MG SL tablet Place 1 tablet (0.4 mg total) under the tongue every 5 (five) minutes as needed for chest pain. 25 tablet 6  . omeprazole (PRILOSEC) 20 MG capsule Take 20 mg by mouth every Monday, Wednesday, and Friday.    . rosuvastatin (CRESTOR) 10 MG tablet Take 10 mg by mouth every morning.    . sildenafil (REVATIO) 20 MG tablet Take 20 mg by mouth daily as needed (ED).     . tamsulosin (FLOMAX) 0.4 MG CAPS capsule Take 0.4 mg by mouth daily.     No current facility-administered medications for this visit.  No Known Allergies  Social History   Social History  . Marital status: Married    Spouse name: N/A  . Number of children: N/A  . Years of education: N/A   Occupational History  . Retired-banker    Social History Main Topics  . Smoking status: Never Smoker  . Smokeless tobacco: Never Used  . Alcohol use Yes     Comment: social  . Drug use: No  . Sexual activity: Not on file   Other Topics Concern  . Not on file   Social History Narrative    Positive for coronary disease in both his father as     well as a stroke in his mother.          Family History  Problem Relation Age of Onset  . Heart attack Father   . Coronary artery disease Unknown     Review of Systems:  As stated in the HPI and otherwise negative.   BP 114/60    Pulse (!) 52   Ht 5\' 10"  (1.778 m)   Wt 187 lb 12.8 oz (85.2 kg)   SpO2 99%   BMI 26.95 kg/m   Physical Examination:  General: Well developed, well nourished, NAD  HEENT: OP clear, mucus membranes moist  SKIN: warm, dry. No rashes. Neuro: No focal deficits  Musculoskeletal: Muscle strength 5/5 all ext  Psychiatric: Mood and affect normal  Neck: No JVD, no carotid bruits, no thyromegaly, no lymphadenopathy.  Lungs:Clear bilaterally, no wheezes, rhonci, crackles Cardiovascular: Regular rate and rhythm. Soft systolic murmur. No gallops or rubs. Abdomen:Soft. Bowel sounds present. Non-tender.  Extremities: No lower extremity edema. Pulses are 2 + in the bilateral DP/PT.  Echo 12/20/16: Left ventricle: The cavity size was normal. Wall thickness was   normal. Systolic function was normal. The estimated ejection   fraction was in the range of 60% to 65%. - Aortic valve: There was mild regurgitation. - Mitral valve: There was moderate regurgitation. - Right atrium: The atrium was mildly dilated. - Tricuspid valve: There was mild-moderate regurgitation.  Cardiac cath February 2018:  Left Main  Ost LM lesion, 70% stenosed.  Left Anterior Descending  Vessel is large.  Mid LAD lesion, 50% stenosed.  First Septal Branch  Vessel is small in size.  Second Diagonal Branch  Vessel is moderate in size.  Second Septal Branch  Vessel is small in size.  Ramus Intermedius  Vessel is large.  Ost Ramus to Ramus lesion, 100% stenosed.  Left Circumflex  Vessel is moderate in size.  First Obtuse Marginal Branch  Vessel is small in size.  Second Obtuse Marginal Branch  Vessel is small in size.  Third Obtuse Marginal Branch  Vessel is small in size.  Right Coronary Artery  Mid RCA lesion, 70% stenosed.  Graft Angiography  saphenous Graft to RPDA  SVG graft was visualized by angiography and is normal in caliber and anatomically normal.  saphenous Graft to Ramus  SVG due to known occlusion  and is normal in caliber and anatomically normal.  Free LIMA Graft to Dist LAD  LIMA graft was visualized by angiography and is normal in caliber and anatomically normal.  Coronary Diagrams   Diagnostic Diagram          EKG:  EKG is not ordered today.  The ekg ordered today demonstrates   Recent Labs: 12/31/2016: BUN 16; Creatinine, Ser 1.11; Hemoglobin 14.6; Platelets 170; Potassium 4.4; Sodium 140   Lipid Panel No results found for: CHOL, TRIG,  HDL, CHOLHDL, VLDL, LDLCALC, LDLDIRECT   Wt Readings from Last 3 Encounters:  08/26/17 187 lb 12.8 oz (85.2 kg)  02/25/17 187 lb 11.2 oz (85.1 kg)  01/10/17 182 lb (82.6 kg)     Other studies Reviewed: Additional studies/ records that were reviewed today include: . Review of the above records demonstrates:    Assessment and Plan:   1. CAD s/p CABG without angina: No chest pain suggestive of angina. Cardiac cath February 2018 with three vessel CAD with 3/3 patent bypass grafts. Continue ASA, statin and bet blocker.    2. Hyperlipidemia: Lipids followed in primary care. Continue statin.   3. Mitral regurgitation: Echo January 2018 with moderate MR. Follow. Repeat echo in January 2020.    Current medicines are reviewed at length with the patient today.  The patient does not have concerns regarding medicines.  The following changes have been made:  no change  Labs/ tests ordered today include:   No orders of the defined types were placed in this encounter.   Disposition:   FU with me in 12 months   Signed, Lauree Chandler, MD 08/26/2017 10:27 AM    [facility] [place], [place], [place]  [address] Phone: [phone]; Fax: [phone]

## 2017-09-02 DIAGNOSIS — M170 Bilateral primary osteoarthritis of knee: Secondary | ICD-10-CM | POA: Diagnosis not present

## 2017-09-02 DIAGNOSIS — M1712 Unilateral primary osteoarthritis, left knee: Secondary | ICD-10-CM | POA: Diagnosis not present

## 2017-09-02 DIAGNOSIS — M25561 Pain in right knee: Secondary | ICD-10-CM | POA: Diagnosis not present

## 2017-09-02 DIAGNOSIS — M25562 Pain in left knee: Secondary | ICD-10-CM | POA: Diagnosis not present

## 2017-09-02 DIAGNOSIS — M1711 Unilateral primary osteoarthritis, right knee: Secondary | ICD-10-CM | POA: Diagnosis not present

## 2017-09-05 ENCOUNTER — Encounter: Payer: Self-pay | Admitting: Physician Assistant

## 2017-09-05 ENCOUNTER — Ambulatory Visit (INDEPENDENT_AMBULATORY_CARE_PROVIDER_SITE_OTHER): Payer: Medicare HMO | Admitting: Physician Assistant

## 2017-09-05 VITALS — BP 110/60 | HR 78 | Ht 70.0 in | Wt 183.0 lb

## 2017-09-05 DIAGNOSIS — R195 Other fecal abnormalities: Secondary | ICD-10-CM | POA: Diagnosis not present

## 2017-09-05 MED ORDER — NA SULFATE-K SULFATE-MG SULF 17.5-3.13-1.6 GM/177ML PO SOLN: 1.0000 | ORAL | 0 refills | Status: DC

## 2017-09-05 NOTE — Progress Notes (Signed)
Reviewed and agree with management plan.  Aracelis Ulrey T. Jennet Scroggin, MD FACG 

## 2017-09-05 NOTE — Progress Notes (Signed)
Chief Complaint: Hemoccult positive stool  HPI:  William Pierce is a 76 year old Caucasian male with a past medical history of CAD, reflux and others listed below,  who was referred to me by Prince Solian, MD for a complaint of Hemoccult positive stool .      Patient's last colonoscopy was 03/15/08 by Dr. Fuller Plan and was normal. Recommendations were for repeat in 10 years.    Patient recently had his annual physical exam on 08/13/17 and at that time he had a Hemosure which was positive. Labs collected 08/06/17 show a normal CBC and CMP.    Today, the patient explains that he would describe himself as a very Architect. He tells me he plays tennis and golf on a regular basis and other than his knees giving him "a bit of trouble here and there". He seems to remain very active. Patient tells me he was unaware that he was having any blood in his stool but was told this after his recent physical exam. He denies a change in bowel habits, abdominal pain, weight loss or melena.   Patient does have chronic reflux which is controlled with Omeprazole 20 mg qd. He denies any breakthrough symptoms.   Patient denies fever, chills, weight loss, anorexia, abdominal pain or symptoms that awaken him at night.  Past Medical History:  Diagnosis Date  . Arthritis    oa  . Benign prostatic hypertrophy   . Cardiovascular disease   . Coronary artery disease   . Erectile dysfunction   . GERD (gastroesophageal reflux disease)   . Hyperlipemia   . Hypertension   . Plantar fasciitis     Past Surgical History:  Procedure Laterality Date  . CARDIAC CATHETERIZATION N/A 01/10/2017   Procedure: Left Heart Cath and Cors/Grafts Angiography;  Surgeon: Burnell Blanks, MD;  Location: [facility];  Service: Cardiovascular;  Laterality: N/A;  . CORONARY ARTERY BYPASS GRAFT  LIMA to LAD, SVG to Intermediate, SVG to PDA   2005 cabg x 3  . Hernia surgery x 2    . TOTAL HIP ARTHROPLASTY Left 01/27/2013   Procedure:  TOTAL HIP ARTHROPLASTY ANTERIOR APPROACH;  Surgeon: Mauri Pole, MD;  Location: [facility];  Service: Orthopedics;  Laterality: Left;    Current Outpatient Prescriptions  Medication Sig Dispense Refill  . allopurinol (ZYLOPRIM) 300 MG tablet Take 300 mg by mouth daily.    Marland Kitchen aspirin EC 81 MG tablet Take 81 mg by mouth once a week.     . Cholecalciferol (VITAMIN D3) 1000 units CAPS Take 1,000 Units by mouth daily.    . diphenhydrAMINE (BENADRYL) 25 MG tablet Take 25 mg by mouth daily as needed for allergies.    Marland Kitchen ezetimibe (ZETIA) 10 MG tablet Take 10 mg by mouth every morning.    . fish oil-omega-3 fatty acids 1000 MG capsule Take 1 g by mouth 2 (two) times daily.     Marland Kitchen ibuprofen (ADVIL,MOTRIN) 200 MG tablet Take 400 mg by mouth 2 (two) times daily as needed for moderate pain.    Marland Kitchen levothyroxine (SYNTHROID, LEVOTHROID) 50 MCG tablet Take 50 mcg by mouth daily before breakfast.    . metoprolol (LOPRESSOR) 50 MG tablet Take 25 mg by mouth 2 (two) times daily.     . nitroGLYCERIN (NITROSTAT) 0.4 MG SL tablet Place 1 tablet (0.4 mg total) under the tongue every 5 (five) minutes as needed for chest pain. 25 tablet 6  . omeprazole (PRILOSEC) 20 MG capsule Take 20  mg by mouth every Monday, Wednesday, and Friday.    . rosuvastatin (CRESTOR) 10 MG tablet Take 10 mg by mouth every morning.    . sildenafil (REVATIO) 20 MG tablet Take 20 mg by mouth daily as needed (ED).     . tamsulosin (FLOMAX) 0.4 MG CAPS capsule Take 0.4 mg by mouth daily.     No current facility-administered medications for this visit.     Allergies as of 09/05/2017  . (No Known Allergies)    Family History  Problem Relation Age of Onset  . Heart attack Father   . Coronary artery disease Unknown     Social History   Social History  . Marital status: Married    Spouse name: N/A  . Number of children: N/A  . Years of education: N/A   Occupational History  . Retired-banker    Social History Main Topics  . Smoking  status: Never Smoker  . Smokeless tobacco: Never Used  . Alcohol use Yes     Comment: social  . Drug use: No  . Sexual activity: Not on file   Other Topics Concern  . Not on file   Social History Narrative    Positive for coronary disease in both his father as     well as a stroke in his mother.          Review of Systems:    Constitutional: No weight loss, fever, chills, weakness or fatigue Skin: No rash  Cardiovascular: No chest pain  Respiratory: No SOB Gastrointestinal: See HPI and otherwise negative Genitourinary: No dysuria Neurological: No headache Musculoskeletal: No new muscle or joint pain Hematologic: No bruising Psychiatric: No history of depression or anxiety   Physical Exam:  Vital signs: BP 110/60   Pulse 78   Ht 5\' 10"  (1.778 m)   Wt 183 lb (83 kg)   BMI 26.26 kg/m    Constitutional:   Pleasant Caucasian male appears to be in NAD, Well developed, Well nourished, alert and cooperative Head:  Normocephalic and atraumatic. Eyes:   PEERL, EOMI. No icterus. Conjunctiva pink. Ears:  Normal auditory acuity. Neck:  Supple Throat: Oral cavity and pharynx without inflammation, swelling or lesion.  Respiratory: Respirations even and unlabored. Lungs clear to auscultation bilaterally.   No wheezes, crackles, or rhonchi.  Cardiovascular: Normal S1, S2. No MRG. Regular rate and rhythm. No peripheral edema, cyanosis or pallor.  Gastrointestinal:  Soft, nondistended, nontender. No rebound or guarding. Normal bowel sounds. No appreciable masses or hepatomegaly. Rectal:  Not performed.  Msk:  Symmetrical without gross deformities. Without edema, no deformity or joint abnormality.  Neurologic:  Alert and  oriented x4;  grossly normal neurologically.  Skin:   Dry and intact without significant lesions or rashes. Psychiatric:  Demonstrates good judgement and reason without abnormal affect or behaviors.  See HPI  Assessment: 1. Hemosure positive stool: Patient's last  colonoscopy was 03/15/08 with recommendations for repeat in 10 years as this was normal, patient recently had hemosure positive stool at annual visit, he has no symptoms today; we will consider lower GI source of blood loss with colonoscopy  Plan: 1. Scheduled patient for a colonoscopy in [place] with Dr. Fuller Plan. Did discuss risks, benefits, limitations and alternatives the patient agrees received. 2. Patient will return to clinic per recommendations from Dr. Fuller Plan after time of procedure.  Ellouise Newer, PA-C [facility] 09/05/2017, 9:08 AM  Cc: Prince Solian, MD

## 2017-09-05 NOTE — Patient Instructions (Addendum)

## 2017-09-17 ENCOUNTER — Encounter (HOSPITAL_COMMUNITY): Payer: Self-pay | Admitting: *Deleted

## 2017-09-17 ENCOUNTER — Emergency Department (HOSPITAL_COMMUNITY)
Admission: EM | Admit: 2017-09-17 | Discharge: 2017-09-17 | Disposition: A | Discharge status: Home / Self Care | Payer: Medicare HMO | Attending: Emergency Medicine | Admitting: Emergency Medicine

## 2017-09-17 DIAGNOSIS — R197 Diarrhea, unspecified: Secondary | ICD-10-CM | POA: Diagnosis not present

## 2017-09-17 DIAGNOSIS — M79606 Pain in leg, unspecified: Secondary | ICD-10-CM | POA: Diagnosis not present

## 2017-09-17 HISTORY — DX: Gout, unspecified: M10.9

## 2017-09-17 HISTORY — DX: Disorder of kidney and ureter, unspecified: N28.9

## 2017-09-17 NOTE — ED Notes (Signed)
Called for Pt to be roomed no response x1

## 2017-09-17 NOTE — ED Notes (Signed)
Called to be roomed x2

## 2017-09-17 NOTE — ED Notes (Signed)
Called Pt to be roomed x3 no response.

## 2017-09-17 NOTE — ED Triage Notes (Signed)
Pt reports eating dinner last night and then having diarrhea for most of the night.  Pt also reports 2 episodes of emesis.  Pt states he had leg cramping last night but he has been having the same leg cramping for the past couple of months. Pt was advised by PCP to come to the ED to be evaluated for dehydration. Pt has had a few sips of water since his last emesis episode. Pt took pepto last night. Pt a/o x4 and ambulatory.

## 2017-09-30 ENCOUNTER — Encounter: Payer: Self-pay | Admitting: Gastroenterology

## 2017-10-14 ENCOUNTER — Encounter: Payer: Self-pay | Admitting: Gastroenterology

## 2017-10-14 ENCOUNTER — Ambulatory Visit (AMBULATORY_SURGERY_CENTER): Payer: Medicare HMO | Admitting: Gastroenterology

## 2017-10-14 VITALS — BP 118/80 | HR 53 | Temp 98.7°F | Resp 13 | Wt 183.0 lb

## 2017-10-14 DIAGNOSIS — I2510 Atherosclerotic heart disease of native coronary artery without angina pectoris: Secondary | ICD-10-CM | POA: Diagnosis not present

## 2017-10-14 DIAGNOSIS — D120 Benign neoplasm of cecum: Secondary | ICD-10-CM

## 2017-10-14 DIAGNOSIS — R195 Other fecal abnormalities: Secondary | ICD-10-CM

## 2017-10-14 DIAGNOSIS — D123 Benign neoplasm of transverse colon: Secondary | ICD-10-CM | POA: Diagnosis not present

## 2017-10-14 DIAGNOSIS — Z8673 Personal history of transient ischemic attack (TIA), and cerebral infarction without residual deficits: Secondary | ICD-10-CM | POA: Diagnosis not present

## 2017-10-14 MED ORDER — SODIUM CHLORIDE 0.9 % IV SOLN: 500.0000 mL | INTRAVENOUS | Status: DC

## 2017-10-14 NOTE — Patient Instructions (Signed)
**   Handouts given on polyps, diverticulosis, and hemorrhoids **   YOU HAD AN ENDOSCOPIC PROCEDURE TODAY AT THE [facility]:   Refer to the procedure report that was given to you for any specific questions about what was found during the examination.  If the procedure report does not answer your questions, please call your gastroenterologist to clarify.  If you requested that your care partner not be given the details of your procedure findings, then the procedure report has been included in a sealed envelope for you to review at your convenience later.  YOU SHOULD EXPECT: Some feelings of bloating in the abdomen. Passage of more gas than usual.  Walking can help get rid of the air that was put into your GI tract during the procedure and reduce the bloating. If you had a lower endoscopy (such as a colonoscopy or flexible sigmoidoscopy) you may notice spotting of blood in your stool or on the toilet paper. If you underwent a bowel prep for your procedure, you may not have a normal bowel movement for a few days.  Please Note:  You might notice some irritation and congestion in your nose or some drainage.  This is from the oxygen used during your procedure.  There is no need for concern and it should clear up in a day or so.  SYMPTOMS TO REPORT IMMEDIATELY:   Following lower endoscopy (colonoscopy or flexible sigmoidoscopy):  Excessive amounts of blood in the stool  Significant tenderness or worsening of abdominal pains  Swelling of the abdomen that is new, acute  Fever of 100F or higher  For urgent or emergent issues, a gastroenterologist can be reached at any hour by calling [phone].   DIET:  We do recommend a small meal at first, but then you may proceed to your regular diet.  Drink plenty of fluids but you should avoid alcoholic beverages for 24 hours.  ACTIVITY:  You should plan to take it easy for the rest of today and you should NOT DRIVE or use heavy machinery until  tomorrow (because of the sedation medicines used during the test).    FOLLOW UP: Our staff will call the number listed on your records the next business day following your procedure to check on you and address any questions or concerns that you may have regarding the information given to you following your procedure. If we do not reach you, we will leave a message.  However, if you are feeling well and you are not experiencing any problems, there is no need to return our call.  We will assume that you have returned to your regular daily activities without incident.  If any biopsies were taken you will be contacted by phone or by letter within the next 1-3 weeks.  Please call us at [phone] if you have not heard about the biopsies in 3 weeks.    SIGNATURES/CONFIDENTIALITY: You and/or your care partner have signed paperwork which will be entered into your electronic medical record.  These signatures attest to the fact that that the information above on your After Visit Summary has been reviewed and is understood.  Full responsibility of the confidentiality of this discharge information lies with you and/or your care-partner. 

## 2017-10-14 NOTE — Progress Notes (Signed)
Pt's states no medical or surgical changes since previsit or office visit. 

## 2017-10-14 NOTE — Progress Notes (Signed)
Report given to PACU, vss 

## 2017-10-14 NOTE — Op Note (Signed)
[place] Patient Name: William Pierce Procedure Date: 10/14/2017 3:26 PM MRN: [id number] Endoscopist: Ladene Artist , MD Age: 76 Referring MD:  Date of Birth: [birth year] Gender: Male Account #: [id number] Procedure:                Colonoscopy Indications:              Positive fecal immunochemical test Medicines:                Monitored Anesthesia Care Procedure:                Pre-Anesthesia Assessment:                           - Prior to the procedure, a History and Physical                            was performed, and patient medications and                            allergies were reviewed. The patient's tolerance of                            previous anesthesia was also reviewed. The risks                            and benefits of the procedure and the sedation                            options and risks were discussed with the patient.                            All questions were answered, and informed consent                            was obtained. Prior Anticoagulants: The patient has                            taken no previous anticoagulant or antiplatelet                            agents. ASA Grade Assessment: II - A patient with                            mild systemic disease. After reviewing the risks                            and benefits, the patient was deemed in                            satisfactory condition to undergo the procedure.                           After obtaining informed consent, the colonoscope  was passed under direct vision. Throughout the                            procedure, the patient's blood pressure, pulse, and                            oxygen saturations were monitored continuously. The                            Colonoscope was introduced through the anus and                            advanced to the the cecum, identified by                            appendiceal orifice and ileocecal  valve. The                            ileocecal valve, appendiceal orifice, and rectum                            were photographed. The quality of the bowel                            preparation was good. The colonoscopy was performed                            without difficulty. The patient tolerated the                            procedure well. Scope In: 3:34:01 PM Scope Out: 3:45:28 PM Scope Withdrawal Time: 0 hours 9 minutes 24 seconds  Total Procedure Duration: 0 hours 11 minutes 27 seconds  Findings:                 The perianal and digital rectal examinations were                            normal.                           Two sessile polyps were found in the transverse                            colon and cecum. The polyps were 6 to 7 mm in size.                            These polyps were removed with a cold snare.                            Resection and retrieval were complete.                           Multiple medium-mouthed diverticula were found in  the left colon. There was evidence of diverticular                            spasm.                           Internal hemorrhoids were found during                            retroflexion. The hemorrhoids were medium-sized and                            Grade I (internal hemorrhoids that do not prolapse).                           The exam was otherwise without abnormality on                            direct and retroflexion views. Complications:            No immediate complications. Estimated blood loss:                            None. Estimated Blood Loss:     Estimated blood loss: none. Impression:               - Two 6 to 7 mm polyps in the transverse colon and                            in the cecum, removed with a cold snare. Resected                            and retrieved.                           - Moderate diverticulosis in the left colon. There                            was  evidence of diverticular spasm.                           - Internal hemorrhoids.                           - The examination was otherwise normal on direct                            and retroflexion views. Recommendation:           - Patient has a contact number available for                            emergencies. The signs and symptoms of potential                            delayed complications were discussed with the  patient. Return to normal activities tomorrow.                            Written discharge instructions were provided to the                            patient.                           - High fiber diet.                           - Continue present medications.                           - Await pathology results.                           - No repeat screening/surveillance colonoscopy is                            planned due to age. Ladene Artist, MD 10/14/2017 3:48:53 PM This report has been signed electronically.

## 2017-10-14 NOTE — Progress Notes (Signed)
Called to room to assist during endoscopic procedure.  Patient ID and intended procedure confirmed with present staff. Received instructions for my participation in the procedure from the performing physician.  

## 2017-10-15 ENCOUNTER — Telehealth: Payer: Self-pay | Admitting: *Deleted

## 2017-10-15 NOTE — Telephone Encounter (Signed)
  Follow up Call-  Call back number 10/14/2017  Post procedure Call Back phone  # [phone]  Permission to leave phone message Yes  Some recent data might be hidden     Patient questions:  Do you have a fever, pain , or abdominal swelling? No. Pain Score  0 *  Have you tolerated food without any problems? Yes.    Have you been able to return to your normal activities? Yes.    Do you have any questions about your discharge instructions: Diet   Yes.   Medications  No. Follow up visit  No.  Do you have questions or concerns about your Care? No.  Actions: * If pain score is 4 or above: No action needed, pain <4.

## 2017-10-22 ENCOUNTER — Encounter: Payer: Self-pay | Admitting: Gastroenterology

## 2017-11-05 DIAGNOSIS — H43813 Vitreous degeneration, bilateral: Secondary | ICD-10-CM | POA: Diagnosis not present

## 2017-11-05 DIAGNOSIS — H02831 Dermatochalasis of right upper eyelid: Secondary | ICD-10-CM | POA: Diagnosis not present

## 2017-11-05 DIAGNOSIS — H02834 Dermatochalasis of left upper eyelid: Secondary | ICD-10-CM | POA: Diagnosis not present

## 2017-11-05 DIAGNOSIS — H2513 Age-related nuclear cataract, bilateral: Secondary | ICD-10-CM | POA: Diagnosis not present

## 2017-12-16 DIAGNOSIS — M13861 Other specified arthritis, right knee: Secondary | ICD-10-CM | POA: Diagnosis not present

## 2017-12-16 DIAGNOSIS — M25561 Pain in right knee: Secondary | ICD-10-CM | POA: Diagnosis not present

## 2018-01-17 DIAGNOSIS — L820 Inflamed seborrheic keratosis: Secondary | ICD-10-CM | POA: Diagnosis not present

## 2018-01-17 DIAGNOSIS — L821 Other seborrheic keratosis: Secondary | ICD-10-CM | POA: Diagnosis not present

## 2018-01-17 DIAGNOSIS — Z85828 Personal history of other malignant neoplasm of skin: Secondary | ICD-10-CM | POA: Diagnosis not present

## 2018-04-24 DIAGNOSIS — R69 Illness, unspecified: Secondary | ICD-10-CM | POA: Diagnosis not present

## 2018-06-04 DIAGNOSIS — M1711 Unilateral primary osteoarthritis, right knee: Secondary | ICD-10-CM | POA: Diagnosis not present

## 2018-06-04 DIAGNOSIS — M1712 Unilateral primary osteoarthritis, left knee: Secondary | ICD-10-CM | POA: Diagnosis not present

## 2018-06-04 DIAGNOSIS — M25561 Pain in right knee: Secondary | ICD-10-CM | POA: Diagnosis not present

## 2018-06-04 DIAGNOSIS — M25562 Pain in left knee: Secondary | ICD-10-CM | POA: Diagnosis not present

## 2018-07-23 DIAGNOSIS — R69 Illness, unspecified: Secondary | ICD-10-CM | POA: Diagnosis not present

## 2018-08-26 ENCOUNTER — Ambulatory Visit: Payer: Medicare HMO | Admitting: Cardiology

## 2018-09-08 DIAGNOSIS — M1711 Unilateral primary osteoarthritis, right knee: Secondary | ICD-10-CM | POA: Diagnosis not present

## 2018-09-08 DIAGNOSIS — M1712 Unilateral primary osteoarthritis, left knee: Secondary | ICD-10-CM | POA: Diagnosis not present

## 2018-09-16 ENCOUNTER — Encounter: Payer: Self-pay | Admitting: *Deleted

## 2018-10-01 ENCOUNTER — Encounter: Payer: Self-pay | Admitting: Cardiology

## 2018-10-01 ENCOUNTER — Ambulatory Visit: Payer: Medicare HMO | Admitting: Cardiology

## 2018-10-01 ENCOUNTER — Encounter (INDEPENDENT_AMBULATORY_CARE_PROVIDER_SITE_OTHER): Payer: Self-pay

## 2018-10-01 VITALS — BP 122/70 | HR 57 | Ht 70.0 in | Wt 182.0 lb

## 2018-10-01 DIAGNOSIS — I340 Nonrheumatic mitral (valve) insufficiency: Secondary | ICD-10-CM | POA: Diagnosis not present

## 2018-10-01 DIAGNOSIS — I2510 Atherosclerotic heart disease of native coronary artery without angina pectoris: Secondary | ICD-10-CM

## 2018-10-01 DIAGNOSIS — I10 Essential (primary) hypertension: Secondary | ICD-10-CM

## 2018-10-01 MED ORDER — ASPIRIN EC 81 MG PO TBEC: 81.0000 mg | DELAYED_RELEASE_TABLET | Freq: Every day | ORAL | 3 refills | Status: DC

## 2018-10-01 NOTE — Patient Instructions (Signed)
Medication Instructions:  Your physician has recommended you make the following change in your medication:  1.  START Aspirin 81 mg daily   If you need a refill on your cardiac medications before your next appointment, please call your pharmacy.   Lab work: None ordered  If you have labs (blood work) drawn today and your tests are completely normal, you will receive your results only by: Marland Kitchen MyChart Message (if you have MyChart) OR . A paper copy in the mail If you have any lab test that is abnormal or we need to change your treatment, we will call you to review the results.  Testing/Procedures: Your physician has requested that you have an echocardiogram. Echocardiography is a painless test that uses sound waves to create images of your heart. It provides your doctor with information about the size and shape of your heart and how well your heart's chambers and valves are working. This procedure takes approximately one hour. There are no restrictions for this procedure.    Follow-Up: At [facility], you and your health needs are our priority.  As part of our continuing mission to provide you with exceptional heart care, we have created designated Provider Care Teams.  These Care Teams include your primary Cardiologist (physician) and Advanced Practice Providers (APPs -  Physician Assistants and Nurse Practitioners) who all work together to provide you with the care you need, when you need it. You will need a follow up appointment in 1 years.  Please call our office 2 months in advance to schedule this appointment.  You may see No primary care provider on file. or one of the following Advanced Practice Providers on your designated Care Team:   Lyda Jester, PA-C Melina Copa, PA-C . Ermalinda Barrios, PA-C  Any Other Special Instructions Will Be Listed Below (If Applicable).

## 2018-10-01 NOTE — Progress Notes (Signed)
10/01/2018 William Pierce [facility]   [birth year]  [id number]  Primary Physician Prince Solian, MD Primary Cardiologist: Dr. Angelena Form  Electrophysiologist: none   Reason for Visit/CC: Yearly f/u for CAD   HPI: 77 y/o WM, followed by Dr. Angelena Form, with history of CAD s/p 3V CABG 2005, HLD, mitral regurgitation, GERD and ED here today for his yearly cardiac follow up. His CABG was 08/10/04. In January 2018, he was evaluated for dyspena. Echo 12/20/16 showed normal LV systolic function, mild AI, moderate MR. Nuclear stress test 1/11/8 showed possible inferior wall ischemia, and EKG changes with exercise. Subsequent cardiac cath February 2018 showed three vessel CAD with 3/3 patent bypass grafts. Continued medical management recommend.   Today in f/u, he reports that he has done well. He denies CP. No exertional dyspnea. He plays double tennis 2 days a week w/o any exertional symptoms. Also plays golf. He reports full med compliance. BP is well controlled at 122/70. EKG shows NSR. HR 57 bmp. He also denies dizziness, syncope/ near syncope, orthopnea, PND and LEE. His PCP follows lipids. Last lipid panel in 2018 showed controlled LDL at 59 mg/dL. He has f/u with his PCP next month and will get repeat labs.   Cardiac Studies  Procedures   Left Heart Cath and Cors/Grafts Angiography 01/2017  Conclusion     Mid RCA lesion, 70 %stenosed.  SVG graft was visualized by angiography and is normal in caliber and anatomically normal.  Ost Ramus to Ramus lesion, 100 %stenosed.  SVG due to known occlusion and is normal in caliber and anatomically normal.  Mid LAD lesion, 50 %stenosed.  LIMA graft was visualized by angiography and is normal in caliber and anatomically normal.  Ost LM lesion, 70 %stenosed.   1. Triple vessel CAD with 3/3 patent bypass grafts   Recommendations: Continue medical management of CAD.     2D Echo 12/2016 Study Conclusions  - Left ventricle: The cavity size was  normal. Wall thickness was   normal. Systolic function was normal. The estimated ejection   fraction was in the range of 60% to 65%. - Aortic valve: There was mild regurgitation. - Mitral valve: There was moderate regurgitation. - Right atrium: The atrium was mildly dilated. - Tricuspid valve: There was mild-moderate regurgitation.    Current Meds  Medication Sig  . allopurinol (ZYLOPRIM) 300 MG tablet Take 300 mg by mouth daily.  . Cholecalciferol (VITAMIN D3) 1000 units CAPS Take 1,000 Units by mouth daily.  . diphenhydrAMINE (BENADRYL) 25 MG tablet Take 25 mg by mouth daily as needed for allergies.  Marland Kitchen ezetimibe (ZETIA) 10 MG tablet Take 10 mg by mouth every morning.  . fish oil-omega-3 fatty acids 1000 MG capsule Take 1 g by mouth 2 (two) times daily.   Marland Kitchen ibuprofen (ADVIL,MOTRIN) 200 MG tablet Take 400 mg by mouth 2 (two) times daily as needed for moderate pain.  Marland Kitchen levothyroxine (SYNTHROID, LEVOTHROID) 50 MCG tablet Take 50 mcg by mouth daily before breakfast.  . metoprolol (LOPRESSOR) 50 MG tablet Take 25 mg by mouth 2 (two) times daily.   . nitroGLYCERIN (NITROSTAT) 0.4 MG SL tablet Place 1 tablet (0.4 mg total) under the tongue every 5 (five) minutes as needed for chest pain.  Marland Kitchen omeprazole (PRILOSEC) 20 MG capsule Take 20 mg by mouth every Monday, Wednesday, and Friday.  . rosuvastatin (CRESTOR) 10 MG tablet Take 10 mg by mouth every morning.  . tamsulosin (FLOMAX) 0.4 MG CAPS capsule Take 0.4 mg by mouth daily.  No Known Allergies Past Medical History:  Diagnosis Date  . Arthritis    oa  . Benign prostatic hypertrophy   . Cardiovascular disease   . Coronary artery disease   . Erectile dysfunction   . GERD (gastroesophageal reflux disease)   . Gout   . Hyperlipemia   . Hypertension   . Plantar fasciitis   . Renal disorder    kidney stones   Family History  Problem Relation Age of Onset  . Heart attack Father   . Coronary artery disease Unknown    Past Surgical  History:  Procedure Laterality Date  . CARDIAC CATHETERIZATION N/A 01/10/2017   Procedure: Left Heart Cath and Cors/Grafts Angiography;  Surgeon: William Blanks, MD;  Location: [facility];  Service: Cardiovascular;  Laterality: N/A;  . CORONARY ARTERY BYPASS GRAFT  LIMA to LAD, SVG to Intermediate, SVG to PDA   2005 cabg x 3  . Hernia surgery x 2    . TOTAL HIP ARTHROPLASTY Left 01/27/2013   Procedure: TOTAL HIP ARTHROPLASTY ANTERIOR APPROACH;  Surgeon: William Pole, MD;  Location: [facility];  Service: Orthopedics;  Laterality: Left;   Social History   Socioeconomic History  . Marital status: Married    Spouse name: Not on file  . Number of children: Not on file  . Years of education: Not on file  . Highest education level: Not on file  Occupational History  . Occupation: Retired-banker  Social Needs  . Financial resource strain: Not on file  . Food insecurity:    Worry: Not on file    Inability: Not on file  . Transportation needs:    Medical: Not on file    Non-medical: Not on file  Tobacco Use  . Smoking status: Never Smoker  . Smokeless tobacco: Never Used  Substance and Sexual Activity  . Alcohol use: Yes    Comment: social  . Drug use: No  . Sexual activity: Not on file  Lifestyle  . Physical activity:    Days per week: Not on file    Minutes per session: Not on file  . Stress: Not on file  Relationships  . Social connections:    Talks on phone: Not on file    Gets together: Not on file    Attends religious service: Not on file    Active member of club or organization: Not on file    Attends meetings of clubs or organizations: Not on file    Relationship status: Not on file  . Intimate partner violence:    Fear of current or ex partner: Not on file    Emotionally abused: Not on file    Physically abused: Not on file    Forced sexual activity: Not on file  Other Topics Concern  . Not on file  Social History Narrative    Positive for coronary  disease in both his father as     well as a stroke in his mother.           Review of Systems: General: negative for chills, fever, night sweats or weight changes.  Cardiovascular: negative for chest pain, dyspnea on exertion, edema, orthopnea, palpitations, paroxysmal nocturnal dyspnea or shortness of breath Dermatological: negative for rash Respiratory: negative for cough or wheezing Urologic: negative for hematuria Abdominal: negative for nausea, vomiting, diarrhea, bright red blood per rectum, melena, or hematemesis Neurologic: negative for visual changes, syncope, or dizziness All other systems reviewed and are otherwise negative except as  noted above.   Physical Exam:  Blood pressure 122/70, pulse (!) 57, height 5\' 10"  (1.778 m), weight 182 lb (82.6 kg).  General appearance: alert, cooperative and no distress Neck: no carotid bruit and no JVD Lungs: clear to auscultation bilaterally Heart: regular rate and rhythm, S1, S2 normal, no murmur, click, rub or gallop Extremities: extremities normal, atraumatic, no cyanosis or edema Pulses: 2+ and symmetric Skin: Skin color, texture, turgor normal. No rashes or lesions Neurologic: Grossly normal  EKG sinus bradycardia 57 bpm, 1st degree AVB-- personally reviewed   ASSESSMENT AND PLAN:   1. CAD: H/o CABG in 2005. Last cardiac cath February 2018 showed three vessel CAD with 3/3 patent bypass grafts. Continued medical management recommend. He has done well. No exertional CP or dyspnea. EKG NSR. HR is well controlled. Lipids under good control w/ statin. Continue BB. I recommended daily ASA, 81 mg, coated.   2. HLD: on statin therapy. Lipid profile followed by PCP. Last lipid panel showed controlled LDL at 59 mg/dl. He is scheduled to get repeat labs at his yearly f/u next month and will have results faxed to [place] for review.   3. MR: last echo 12/2016 showed moderate MR and normal LVEF. He denies CP and dyspnea. Per Dr. Angelena Form, plan to  repeat echo in January 2020. This will be ordered.   Follow-Up w/ Dr. Angelena Form in 1 year.   Brittainy Ladoris Gene, MHS [facility] 10/01/2018 3:10 PM

## 2018-10-13 DIAGNOSIS — M109 Gout, unspecified: Secondary | ICD-10-CM | POA: Diagnosis not present

## 2018-10-13 DIAGNOSIS — Z125 Encounter for screening for malignant neoplasm of prostate: Secondary | ICD-10-CM | POA: Diagnosis not present

## 2018-10-13 DIAGNOSIS — I10 Essential (primary) hypertension: Secondary | ICD-10-CM | POA: Diagnosis not present

## 2018-10-13 DIAGNOSIS — E7849 Other hyperlipidemia: Secondary | ICD-10-CM | POA: Diagnosis not present

## 2018-10-13 DIAGNOSIS — R82998 Other abnormal findings in urine: Secondary | ICD-10-CM | POA: Diagnosis not present

## 2018-10-13 DIAGNOSIS — E038 Other specified hypothyroidism: Secondary | ICD-10-CM | POA: Diagnosis not present

## 2018-10-14 DIAGNOSIS — Z23 Encounter for immunization: Secondary | ICD-10-CM | POA: Diagnosis not present

## 2018-10-14 DIAGNOSIS — R195 Other fecal abnormalities: Secondary | ICD-10-CM | POA: Diagnosis not present

## 2018-10-14 DIAGNOSIS — N401 Enlarged prostate with lower urinary tract symptoms: Secondary | ICD-10-CM | POA: Diagnosis not present

## 2018-10-14 DIAGNOSIS — Z0000 Encounter for general adult medical examination without abnormal findings: Secondary | ICD-10-CM | POA: Diagnosis not present

## 2018-10-14 DIAGNOSIS — M1990 Unspecified osteoarthritis, unspecified site: Secondary | ICD-10-CM | POA: Diagnosis not present

## 2018-10-14 DIAGNOSIS — K219 Gastro-esophageal reflux disease without esophagitis: Secondary | ICD-10-CM | POA: Diagnosis not present

## 2018-10-14 DIAGNOSIS — N183 Chronic kidney disease, stage 3 (moderate): Secondary | ICD-10-CM | POA: Diagnosis not present

## 2018-10-14 DIAGNOSIS — I129 Hypertensive chronic kidney disease with stage 1 through stage 4 chronic kidney disease, or unspecified chronic kidney disease: Secondary | ICD-10-CM | POA: Diagnosis not present

## 2018-10-14 DIAGNOSIS — E7849 Other hyperlipidemia: Secondary | ICD-10-CM | POA: Diagnosis not present

## 2018-10-14 DIAGNOSIS — E038 Other specified hypothyroidism: Secondary | ICD-10-CM | POA: Diagnosis not present

## 2018-10-14 DIAGNOSIS — I2510 Atherosclerotic heart disease of native coronary artery without angina pectoris: Secondary | ICD-10-CM | POA: Diagnosis not present

## 2018-10-22 DIAGNOSIS — Z1212 Encounter for screening for malignant neoplasm of rectum: Secondary | ICD-10-CM | POA: Diagnosis not present

## 2018-11-11 DIAGNOSIS — H02831 Dermatochalasis of right upper eyelid: Secondary | ICD-10-CM | POA: Diagnosis not present

## 2018-11-11 DIAGNOSIS — H01024 Squamous blepharitis left upper eyelid: Secondary | ICD-10-CM | POA: Diagnosis not present

## 2018-11-11 DIAGNOSIS — H02834 Dermatochalasis of left upper eyelid: Secondary | ICD-10-CM | POA: Diagnosis not present

## 2018-11-11 DIAGNOSIS — H01021 Squamous blepharitis right upper eyelid: Secondary | ICD-10-CM | POA: Diagnosis not present

## 2018-11-11 DIAGNOSIS — H01025 Squamous blepharitis left lower eyelid: Secondary | ICD-10-CM | POA: Diagnosis not present

## 2018-11-11 DIAGNOSIS — H01022 Squamous blepharitis right lower eyelid: Secondary | ICD-10-CM | POA: Diagnosis not present

## 2018-11-11 DIAGNOSIS — H2513 Age-related nuclear cataract, bilateral: Secondary | ICD-10-CM | POA: Diagnosis not present

## 2018-11-11 DIAGNOSIS — H43813 Vitreous degeneration, bilateral: Secondary | ICD-10-CM | POA: Diagnosis not present

## 2018-12-16 DIAGNOSIS — R69 Illness, unspecified: Secondary | ICD-10-CM | POA: Diagnosis not present

## 2018-12-24 DIAGNOSIS — M1711 Unilateral primary osteoarthritis, right knee: Secondary | ICD-10-CM | POA: Diagnosis not present

## 2018-12-24 DIAGNOSIS — M1712 Unilateral primary osteoarthritis, left knee: Secondary | ICD-10-CM | POA: Diagnosis not present

## 2018-12-24 DIAGNOSIS — M25561 Pain in right knee: Secondary | ICD-10-CM | POA: Diagnosis not present

## 2018-12-24 DIAGNOSIS — M25562 Pain in left knee: Secondary | ICD-10-CM | POA: Diagnosis not present

## 2019-01-01 ENCOUNTER — Other Ambulatory Visit: Payer: Self-pay

## 2019-01-01 ENCOUNTER — Ambulatory Visit (HOSPITAL_COMMUNITY): Payer: Medicare HMO | Attending: Cardiovascular Disease

## 2019-01-01 DIAGNOSIS — I340 Nonrheumatic mitral (valve) insufficiency: Secondary | ICD-10-CM

## 2019-01-29 DIAGNOSIS — R69 Illness, unspecified: Secondary | ICD-10-CM | POA: Diagnosis not present

## 2019-03-16 DIAGNOSIS — M25562 Pain in left knee: Secondary | ICD-10-CM | POA: Diagnosis not present

## 2019-03-16 DIAGNOSIS — M1712 Unilateral primary osteoarthritis, left knee: Secondary | ICD-10-CM | POA: Diagnosis not present

## 2019-03-16 DIAGNOSIS — M1711 Unilateral primary osteoarthritis, right knee: Secondary | ICD-10-CM | POA: Diagnosis not present

## 2019-03-16 DIAGNOSIS — M170 Bilateral primary osteoarthritis of knee: Secondary | ICD-10-CM | POA: Diagnosis not present

## 2019-03-16 DIAGNOSIS — M25561 Pain in right knee: Secondary | ICD-10-CM | POA: Diagnosis not present

## 2019-07-08 DIAGNOSIS — M25561 Pain in right knee: Secondary | ICD-10-CM | POA: Diagnosis not present

## 2019-07-08 DIAGNOSIS — M170 Bilateral primary osteoarthritis of knee: Secondary | ICD-10-CM | POA: Diagnosis not present

## 2019-07-08 DIAGNOSIS — M25562 Pain in left knee: Secondary | ICD-10-CM | POA: Diagnosis not present

## 2019-09-01 DIAGNOSIS — R69 Illness, unspecified: Secondary | ICD-10-CM | POA: Diagnosis not present

## 2019-10-02 ENCOUNTER — Encounter: Payer: Self-pay | Admitting: Physician Assistant

## 2019-10-02 NOTE — Progress Notes (Signed)
Cardiology Office Note    Date:  10/05/2019   ID:  William Pierce, DOB 1941/04/30, MRN [id number]  PCP:  Prince Solian, MD  Cardiologist:  Lauree Chandler, MD  Electrophysiologist:  None   Chief Complaint: routine f/u CAD  History of Present Illness:   William Pierce is a 78 y.o. male with history of CAD s/p 3V CABG 2005, HLD, MVP with mitral regurgitation, aortic regurgitation, GERD and ED here today for his yearly cardiac follow up. His CABG was 08/2004.In January 2018, he was evaluated for dyspnea. 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 Nuclear stress test 1/11/8 showed possible inferior wall ischemia, and EKG changes with exercise. Subsequent cardiac cath February 2018 showed three vessel CAD with 3/3 patent bypass grafts. Continued medical management recommended. Last labs 10/2018 BUN 20, Cr 1.1, K 4.3, normal LFTs, Hgb 15.9, Plt 169, LDL 68, triglycerides 81.  He is seen back this year and states overall he is doing well. In February he had to quit tennis because of some knee issues. He is hoping to avoid knee replacement. Recently he set foot on a tennis court with his grandkids and noticed he got winded easier which he attributed to being out of shape. He has since been able to go to the mountains with his family and go on hikes without any angina or dyspnea. No bleeding, palpitations, orthopnea, PND, edema.   Past Medical History:  Diagnosis Date  . Aortic insufficiency   . Arthritis    oa  . Benign prostatic hypertrophy   . Coronary artery disease    a. s/p 3V CABG 2005.  Marland Kitchen Erectile dysfunction   . GERD (gastroesophageal reflux disease)   . Gout   . Hyperlipemia   . Hypertension   . Kidney stones    kidney stones  . Mitral regurgitation   . Mitral valve prolapse   . Plantar  fasciitis     Past Surgical History:  Procedure Laterality Date  . CARDIAC CATHETERIZATION N/A 01/10/2017   Procedure: Left Heart Cath and Cors/Grafts Angiography;  Surgeon: Burnell Blanks, MD;  Location: [facility];  Service: Cardiovascular;  Laterality: N/A;  . CORONARY ARTERY BYPASS GRAFT  LIMA to LAD, SVG to Intermediate, SVG to PDA   2005 cabg x 3  . Hernia surgery x 2    . TOTAL HIP ARTHROPLASTY Left 01/27/2013   Procedure: TOTAL HIP ARTHROPLASTY ANTERIOR APPROACH;  Surgeon: Mauri Pole, MD;  Location: [facility];  Service: Orthopedics;  Laterality: Left;    Current Medications: Current Meds  Medication Sig  . allopurinol (ZYLOPRIM) 300 MG tablet Take 300 mg by mouth daily.  Marland Kitchen aspirin EC 81 MG tablet Take 1 tablet (81 mg total) by mouth daily.  . Cholecalciferol (VITAMIN D3) 1000 units CAPS Take 1,000 Units by mouth daily.  . diphenhydrAMINE (BENADRYL) 25 MG tablet Take 25 mg by mouth daily as needed for allergies.  Marland Kitchen ezetimibe (ZETIA) 10 MG tablet Take 10 mg by mouth every morning.  . fish oil-omega-3 fatty acids 1000 MG capsule Take 1 g by mouth 2 (two) times daily.   Marland Kitchen ibuprofen (ADVIL,MOTRIN) 200 MG tablet Take 400 mg by mouth 2 (two) times daily as needed for moderate pain.  Marland Kitchen levothyroxine (SYNTHROID) 75 MCG tablet Take 75 mcg by mouth daily.  . metoprolol (LOPRESSOR) 50 MG  tablet Take 25 mg by mouth 2 (two) times daily.   . nitroGLYCERIN (NITROSTAT) 0.4 MG SL tablet Place 1 tablet (0.4 mg total) under the tongue every 5 (five) minutes as needed for chest pain.  Marland Kitchen omeprazole (PRILOSEC) 20 MG capsule Take 20 mg by mouth every Monday, Wednesday, and Friday.  . rosuvastatin (CRESTOR) 10 MG tablet Take 10 mg by mouth every morning.  . sildenafil (VIAGRA) 100 MG tablet Take 100 mg by mouth as needed. 1 hr prior to sexual activity  . tamsulosin (FLOMAX) 0.4 MG CAPS capsule Take 0.4 mg by mouth daily.  . [DISCONTINUED] levothyroxine (SYNTHROID, LEVOTHROID) 50 MCG  tablet Take 50 mcg by mouth daily before breakfast.    Allergies:   Patient has no known allergies.   Social History   Socioeconomic History  . Marital status: Married    Spouse name: Not on file  . Number of children: Not on file  . Years of education: Not on file  . Highest education level: Not on file  Occupational History  . Occupation: Retired-banker  Social Needs  . Financial resource strain: Not on file  . Food insecurity    Worry: Not on file    Inability: Not on file  . Transportation needs    Medical: Not on file    Non-medical: Not on file  Tobacco Use  . Smoking status: Never Smoker  . Smokeless tobacco: Never Used  Substance and Sexual Activity  . Alcohol use: Yes    Comment: social  . Drug use: No  . Sexual activity: Not on file  Lifestyle  . Physical activity    Days per week: Not on file    Minutes per session: Not on file  . Stress: Not on file  Relationships  . Social Herbalist on phone: Not on file    Gets together: Not on file    Attends religious service: Not on file    Active member of club or organization: Not on file    Attends meetings of clubs or organizations: Not on file    Relationship status: Not on file  Other Topics Concern  . Not on file  Social History Narrative    Positive for coronary disease in both his father as     well as a stroke in his mother.           Family History:  The patient's family history includes Coronary artery disease in his unknown relative; Heart attack in his father.  ROS:   Please see the history of present illness. Otherwise, review of systems is positive for bilateral knee pain All other systems are reviewed and otherwise negative.    EKGs/Labs/Other Studies Reviewed:    Studies reviewed were summarized above.   EKG:  EKG is ordered today, personally reviewed, demonstrating sinus bradycardia 58bpm, 1st degree AV block, nonspecific TW changes inferiorly  Recent Labs: No results  found for requested labs within last 8760 hours.  Recent Lipid Panel No results found for: CHOL, TRIG, HDL, CHOLHDL, VLDL, LDLCALC, LDLDIRECT  PHYSICAL EXAM:    VS:  BP 112/62   Pulse (!) 58   Ht 5\' 10"  (1.778 m)   Wt 182 lb 12.8 oz (82.9 kg)   SpO2 98%   BMI 26.23 kg/m   BMI: Body mass index is 26.23 kg/m.  GEN: Well nourished, well developed WM, in no acute distress HEENT: normocephalic, atraumatic Neck: no JVD, carotid bruits, or masses Cardiac: RRR; no murmurs,  rubs, or gallops, no edema  Respiratory:  clear to auscultation bilaterally, normal work of breathing GI: soft, nontender, nondistended, + BS MS: no deformity or atrophy Skin: warm and dry, no rash Neuro:  Alert and Oriented x 3, Strength and sensation are intact, follows commands Psych: euthymic mood, full affect  Wt Readings from Last 3 Encounters:  10/05/19 182 lb 12.8 oz (82.9 kg)  10/01/18 182 lb (82.6 kg)  10/14/17 183 lb (83 kg)     ASSESSMENT & PLAN:   1. CAD s/p CABG - overall doing well. He had mild DOE when starting back a game of tennis after not doing so for several months, but has since been able to go on mountain hikes without any functional limitation. He's not had any chest pain. We discussed observing for any recurrent symptoms particularly as he steadily builds back up his endurance. He will continue ASA, BB, statin, Zetia. He is due for routine labs in November. He does not actively have a PCP appt scheduled so I offered to obtain in our office but he would prefer to just have them done by primary care.  2. Hyperlipidemia - see above regarding labs. He is not having any myalgias. It does not appear that we prescribe his cholesterol medication. 3. Essential HTN - well controlled on present regimen. 4. MVP with moderate mitral regurgitation and mild aortic regurgitation - per guidelines, moderate mitral regurgitation is suggested to be followed every 1-2 years by echocardiogram. Will reassess given  recent dyspnea on exertion to re-evaluate. If stable, anticipate surveillance for recurrent symptoms and clinical yearly follow-up.  Disposition: F/u with Dr. Angelena Form in 1 year.  Medication Adjustments/Labs and Tests Ordered: Current medicines are reviewed at length with the patient today.  Concerns regarding medicines are outlined above. Medication changes, Labs and Tests ordered today are summarized above and listed in the Patient Instructions accessible in Encounters.   Signed, Charlie Pitter, PA-C  10/05/2019 3:51 PM    [facility] [place], [place], [place]  [address] Phone: [phone]; Fax: [phone]

## 2019-10-05 ENCOUNTER — Encounter (INDEPENDENT_AMBULATORY_CARE_PROVIDER_SITE_OTHER): Payer: Self-pay

## 2019-10-05 ENCOUNTER — Other Ambulatory Visit: Payer: Self-pay

## 2019-10-05 ENCOUNTER — Ambulatory Visit: Payer: Medicare HMO | Admitting: Physician Assistant

## 2019-10-05 ENCOUNTER — Encounter: Payer: Self-pay | Admitting: Physician Assistant

## 2019-10-05 VITALS — BP 112/62 | HR 58 | Ht 70.0 in | Wt 182.8 lb

## 2019-10-05 DIAGNOSIS — E785 Hyperlipidemia, unspecified: Secondary | ICD-10-CM | POA: Diagnosis not present

## 2019-10-05 DIAGNOSIS — I351 Nonrheumatic aortic (valve) insufficiency: Secondary | ICD-10-CM

## 2019-10-05 DIAGNOSIS — I341 Nonrheumatic mitral (valve) prolapse: Secondary | ICD-10-CM | POA: Diagnosis not present

## 2019-10-05 DIAGNOSIS — I10 Essential (primary) hypertension: Secondary | ICD-10-CM | POA: Diagnosis not present

## 2019-10-05 DIAGNOSIS — I2510 Atherosclerotic heart disease of native coronary artery without angina pectoris: Secondary | ICD-10-CM

## 2019-10-05 DIAGNOSIS — I340 Nonrheumatic mitral (valve) insufficiency: Secondary | ICD-10-CM | POA: Diagnosis not present

## 2019-10-05 NOTE — Patient Instructions (Addendum)
Medication Instructions:  Your physician recommends that you continue on your current medications as directed. Please refer to the Current Medication list given to you today.  *If you need a refill on your cardiac medications before your next appointment, please call your pharmacy*  Lab Work: None ordered  If you have labs (blood work) drawn today and your tests are completely normal, you will receive your results only by: Marland Kitchen MyChart Message (if you have MyChart) OR . A paper copy in the mail If you have any lab test that is abnormal or we need to change your treatment, we will call you to review the results.  Testing/Procedures: Your physician has requested that you have an echocardiogram ARRIVE AT 12:45 FOR THIS.  Echocardiography is a painless test that uses sound waves to create images of your heart. It provides your doctor with information about the size and shape of your heart and how well your heart's chambers and valves are working. This procedure takes approximately one hour. There are no restrictions for this procedure.    Follow-Up: At [facility], you and your health needs are our priority.  As part of our continuing mission to provide you with exceptional heart care, we have created designated Provider Care Teams.  These Care Teams include your primary Cardiologist (physician) and Advanced Practice Providers (APPs -  Physician Assistants and Nurse Practitioners) who all work together to provide you with the care you need, when you need it.  Your next appointment:   12 months  The format for your next appointment:   In Person  Provider:   You may see Lauree Chandler, MD or one of the following Advanced Practice Providers on your designated Care Team:    Melina Copa, PA-C  Ermalinda Barrios, PA-C   Other Instructions  Echocardiogram An echocardiogram is a procedure that uses painless sound waves (ultrasound) to produce an image of the heart. Images from an  echocardiogram can provide important information about:  Signs of coronary artery disease (CAD).  Aneurysm detection. An aneurysm is a weak or damaged part of an artery wall that bulges out from the normal force of blood pumping through the body.  Heart size and shape. Changes in the size or shape of the heart can be associated with certain conditions, including heart failure, aneurysm, and CAD.  Heart muscle function.  Heart valve function.  Signs of a past heart attack.  Fluid buildup around the heart.  Thickening of the heart muscle.  A tumor or infectious growth around the heart valves. Tell a health care provider about:  Any allergies you have.  All medicines you are taking, including vitamins, herbs, eye drops, creams, and over-the-counter medicines.  Any blood disorders you have.  Any surgeries you have had.  Any medical conditions you have.  Whether you are pregnant or may be pregnant. What are the risks? Generally, this is a safe procedure. However, problems may occur, including:  Allergic reaction to dye (contrast) that may be used during the procedure. What happens before the procedure? No specific preparation is needed. You may eat and drink normally. What happens during the procedure?   An IV tube may be inserted into one of your veins.  You may receive contrast through this tube. A contrast is an injection that improves the quality of the pictures from your heart.  A gel will be applied to your chest.  A wand-like tool (transducer) will be moved over your chest. The gel will help to transmit  the sound waves from the transducer.  The sound waves will harmlessly bounce off of your heart to allow the heart images to be captured in real-time motion. The images will be recorded on a computer. The procedure may vary among health care providers and hospitals. What happens after the procedure?  You may return to your normal, everyday life, including diet,  activities, and medicines, unless your health care provider tells you not to do that. Summary  An echocardiogram is a procedure that uses painless sound waves (ultrasound) to produce an image of the heart.  Images from an echocardiogram can provide important information about the size and shape of your heart, heart muscle function, heart valve function, and fluid buildup around your heart.  You do not need to do anything to prepare before this procedure. You may eat and drink normally.  After the echocardiogram is completed, you may return to your normal, everyday life, unless your health care provider tells you not to do that. This information is not intended to replace advice given to you by your health care provider. Make sure you discuss any questions you have with your health care provider. Document Released: 11/23/2000 Document Revised: 03/19/2019 Document Reviewed: 12/29/2016 Elsevier Patient Education  2020 [employer].     Remember to schedule your routine physical with your primary care doctor and have them send [place] a copy of your labs.Marland Kitchen

## 2019-10-07 DIAGNOSIS — M1711 Unilateral primary osteoarthritis, right knee: Secondary | ICD-10-CM | POA: Diagnosis not present

## 2019-10-07 DIAGNOSIS — M25561 Pain in right knee: Secondary | ICD-10-CM | POA: Diagnosis not present

## 2019-10-07 DIAGNOSIS — M25562 Pain in left knee: Secondary | ICD-10-CM | POA: Diagnosis not present

## 2019-10-07 DIAGNOSIS — M1712 Unilateral primary osteoarthritis, left knee: Secondary | ICD-10-CM | POA: Diagnosis not present

## 2019-10-07 DIAGNOSIS — M170 Bilateral primary osteoarthritis of knee: Secondary | ICD-10-CM | POA: Diagnosis not present

## 2019-10-12 ENCOUNTER — Other Ambulatory Visit: Payer: Self-pay

## 2019-10-12 ENCOUNTER — Ambulatory Visit (HOSPITAL_COMMUNITY): Payer: Medicare HMO | Attending: Internal Medicine

## 2019-10-12 DIAGNOSIS — I341 Nonrheumatic mitral (valve) prolapse: Secondary | ICD-10-CM | POA: Diagnosis not present

## 2019-10-12 DIAGNOSIS — I340 Nonrheumatic mitral (valve) insufficiency: Secondary | ICD-10-CM | POA: Diagnosis not present

## 2019-10-23 DIAGNOSIS — M109 Gout, unspecified: Secondary | ICD-10-CM | POA: Diagnosis not present

## 2019-10-23 DIAGNOSIS — E7849 Other hyperlipidemia: Secondary | ICD-10-CM | POA: Diagnosis not present

## 2019-10-23 DIAGNOSIS — Z125 Encounter for screening for malignant neoplasm of prostate: Secondary | ICD-10-CM | POA: Diagnosis not present

## 2019-10-23 DIAGNOSIS — E038 Other specified hypothyroidism: Secondary | ICD-10-CM | POA: Diagnosis not present

## 2019-10-28 DIAGNOSIS — R69 Illness, unspecified: Secondary | ICD-10-CM | POA: Diagnosis not present

## 2019-10-29 DIAGNOSIS — N401 Enlarged prostate with lower urinary tract symptoms: Secondary | ICD-10-CM | POA: Diagnosis not present

## 2019-10-29 DIAGNOSIS — R82998 Other abnormal findings in urine: Secondary | ICD-10-CM | POA: Diagnosis not present

## 2019-10-29 DIAGNOSIS — E039 Hypothyroidism, unspecified: Secondary | ICD-10-CM | POA: Diagnosis not present

## 2019-10-29 DIAGNOSIS — Z0000 Encounter for general adult medical examination without abnormal findings: Secondary | ICD-10-CM | POA: Diagnosis not present

## 2019-10-29 DIAGNOSIS — M109 Gout, unspecified: Secondary | ICD-10-CM | POA: Diagnosis not present

## 2019-10-29 DIAGNOSIS — I129 Hypertensive chronic kidney disease with stage 1 through stage 4 chronic kidney disease, or unspecified chronic kidney disease: Secondary | ICD-10-CM | POA: Diagnosis not present

## 2019-10-29 DIAGNOSIS — M1990 Unspecified osteoarthritis, unspecified site: Secondary | ICD-10-CM | POA: Diagnosis not present

## 2019-10-29 DIAGNOSIS — N183 Chronic kidney disease, stage 3 unspecified: Secondary | ICD-10-CM | POA: Diagnosis not present

## 2019-10-29 DIAGNOSIS — J302 Other seasonal allergic rhinitis: Secondary | ICD-10-CM | POA: Diagnosis not present

## 2019-10-29 DIAGNOSIS — Z23 Encounter for immunization: Secondary | ICD-10-CM | POA: Diagnosis not present

## 2019-10-29 DIAGNOSIS — K219 Gastro-esophageal reflux disease without esophagitis: Secondary | ICD-10-CM | POA: Diagnosis not present

## 2019-10-29 DIAGNOSIS — E785 Hyperlipidemia, unspecified: Secondary | ICD-10-CM | POA: Diagnosis not present

## 2019-11-17 DIAGNOSIS — H2513 Age-related nuclear cataract, bilateral: Secondary | ICD-10-CM | POA: Diagnosis not present

## 2019-11-17 DIAGNOSIS — H43813 Vitreous degeneration, bilateral: Secondary | ICD-10-CM | POA: Diagnosis not present

## 2019-11-17 DIAGNOSIS — H02834 Dermatochalasis of left upper eyelid: Secondary | ICD-10-CM | POA: Diagnosis not present

## 2019-11-17 DIAGNOSIS — H0102A Squamous blepharitis right eye, upper and lower eyelids: Secondary | ICD-10-CM | POA: Diagnosis not present

## 2019-11-17 DIAGNOSIS — H0102B Squamous blepharitis left eye, upper and lower eyelids: Secondary | ICD-10-CM | POA: Diagnosis not present

## 2019-11-17 DIAGNOSIS — H02831 Dermatochalasis of right upper eyelid: Secondary | ICD-10-CM | POA: Diagnosis not present

## 2019-12-02 DIAGNOSIS — Z1212 Encounter for screening for malignant neoplasm of rectum: Secondary | ICD-10-CM | POA: Diagnosis not present

## 2020-02-03 DIAGNOSIS — M25562 Pain in left knee: Secondary | ICD-10-CM | POA: Diagnosis not present

## 2020-02-03 DIAGNOSIS — M1711 Unilateral primary osteoarthritis, right knee: Secondary | ICD-10-CM | POA: Diagnosis not present

## 2020-02-03 DIAGNOSIS — M170 Bilateral primary osteoarthritis of knee: Secondary | ICD-10-CM | POA: Diagnosis not present

## 2020-02-03 DIAGNOSIS — M25561 Pain in right knee: Secondary | ICD-10-CM | POA: Diagnosis not present

## 2020-02-03 DIAGNOSIS — M1712 Unilateral primary osteoarthritis, left knee: Secondary | ICD-10-CM | POA: Diagnosis not present

## 2020-06-08 DIAGNOSIS — M1711 Unilateral primary osteoarthritis, right knee: Secondary | ICD-10-CM | POA: Diagnosis not present

## 2020-06-08 DIAGNOSIS — M25562 Pain in left knee: Secondary | ICD-10-CM | POA: Diagnosis not present

## 2020-06-08 DIAGNOSIS — M170 Bilateral primary osteoarthritis of knee: Secondary | ICD-10-CM | POA: Diagnosis not present

## 2020-06-08 DIAGNOSIS — M25561 Pain in right knee: Secondary | ICD-10-CM | POA: Diagnosis not present

## 2020-06-08 DIAGNOSIS — M1712 Unilateral primary osteoarthritis, left knee: Secondary | ICD-10-CM | POA: Diagnosis not present

## 2020-08-03 DIAGNOSIS — R69 Illness, unspecified: Secondary | ICD-10-CM | POA: Diagnosis not present

## 2020-08-30 DIAGNOSIS — L821 Other seborrheic keratosis: Secondary | ICD-10-CM | POA: Diagnosis not present

## 2020-08-30 DIAGNOSIS — L814 Other melanin hyperpigmentation: Secondary | ICD-10-CM | POA: Diagnosis not present

## 2020-08-30 DIAGNOSIS — L820 Inflamed seborrheic keratosis: Secondary | ICD-10-CM | POA: Diagnosis not present

## 2020-08-30 DIAGNOSIS — Z85828 Personal history of other malignant neoplasm of skin: Secondary | ICD-10-CM | POA: Diagnosis not present

## 2020-09-27 DIAGNOSIS — R69 Illness, unspecified: Secondary | ICD-10-CM | POA: Diagnosis not present

## 2020-10-19 ENCOUNTER — Ambulatory Visit (INDEPENDENT_AMBULATORY_CARE_PROVIDER_SITE_OTHER): Payer: Medicare HMO | Admitting: Otolaryngology

## 2020-10-19 ENCOUNTER — Other Ambulatory Visit: Payer: Self-pay

## 2020-10-19 ENCOUNTER — Encounter (INDEPENDENT_AMBULATORY_CARE_PROVIDER_SITE_OTHER): Payer: Self-pay | Admitting: Otolaryngology

## 2020-10-19 VITALS — Temp 97.5°F

## 2020-10-19 DIAGNOSIS — M5136 Other intervertebral disc degeneration, lumbar region: Secondary | ICD-10-CM | POA: Diagnosis not present

## 2020-10-19 DIAGNOSIS — M7918 Myalgia, other site: Secondary | ICD-10-CM | POA: Diagnosis not present

## 2020-10-19 DIAGNOSIS — H6123 Impacted cerumen, bilateral: Secondary | ICD-10-CM

## 2020-10-19 DIAGNOSIS — H903 Sensorineural hearing loss, bilateral: Secondary | ICD-10-CM

## 2020-10-19 DIAGNOSIS — M170 Bilateral primary osteoarthritis of knee: Secondary | ICD-10-CM | POA: Diagnosis not present

## 2020-10-19 DIAGNOSIS — M1611 Unilateral primary osteoarthritis, right hip: Secondary | ICD-10-CM | POA: Diagnosis not present

## 2020-10-19 NOTE — Progress Notes (Signed)
HPI: William Pierce is a 79 y.o. male who presents for evaluation of hearing loss and wax buildup in his ears.  He is scheduled get a hearing test later this week.  He has noticed gradual decline in his hearing in both ears..  Past Medical History:  Diagnosis Date  . Aortic insufficiency   . Arthritis    oa  . Benign prostatic hypertrophy   . Coronary artery disease    a. s/p 3V CABG 2005.  Marland Kitchen Erectile dysfunction   . GERD (gastroesophageal reflux disease)   . Gout   . Hyperlipemia   . Hypertension   . Kidney stones    kidney stones  . Mitral regurgitation   . Mitral valve prolapse   . Plantar fasciitis    Past Surgical History:  Procedure Laterality Date  . CARDIAC CATHETERIZATION N/A 01/10/2017   Procedure: Left Heart Cath and Cors/Grafts Angiography;  Surgeon: Burnell Blanks, MD;  Location: [facility];  Service: Cardiovascular;  Laterality: N/A;  . CORONARY ARTERY BYPASS GRAFT  LIMA to LAD, SVG to Intermediate, SVG to PDA   2005 cabg x 3  . Hernia surgery x 2    . TOTAL HIP ARTHROPLASTY Left 01/27/2013   Procedure: TOTAL HIP ARTHROPLASTY ANTERIOR APPROACH;  Surgeon: Mauri Pole, MD;  Location: [facility];  Service: Orthopedics;  Laterality: Left;   Social History   Socioeconomic History  . Marital status: Married    Spouse name: Not on file  . Number of children: Not on file  . Years of education: Not on file  . Highest education level: Not on file  Occupational History  . Occupation: Retired-banker  Tobacco Use  . Smoking status: Never Smoker  . Smokeless tobacco: Never Used  Vaping Use  . Vaping Use: Never used  Substance and Sexual Activity  . Alcohol use: Yes    Comment: social  . Drug use: No  . Sexual activity: Not on file  Other Topics Concern  . Not on file  Social History Narrative    Positive for coronary disease in both his father as     well as a stroke in his mother.         Social Determinants of Health   Financial Resource  Strain:   . Difficulty of Paying Living Expenses: Not on file  Food Insecurity:   . Worried About Charity fundraiser in the Last Year: Not on file  . Ran Out of Food in the Last Year: Not on file  Transportation Needs:   . Lack of Transportation (Medical): Not on file  . Lack of Transportation (Non-Medical): Not on file  Physical Activity:   . Days of Exercise per Week: Not on file  . Minutes of Exercise per Session: Not on file  Stress:   . Feeling of Stress : Not on file  Social Connections:   . Frequency of Communication with Friends and Family: Not on file  . Frequency of Social Gatherings with Friends and Family: Not on file  . Attends Religious Services: Not on file  . Active Member of Clubs or Organizations: Not on file  . Attends Archivist Meetings: Not on file  . Marital Status: Not on file   Family History  Problem Relation Age of Onset  . Heart attack Father   . Coronary artery disease Unknown    No Known Allergies Prior to Admission medications   Medication Sig Start Date End Date Taking? Authorizing  Provider  allopurinol (ZYLOPRIM) 300 MG tablet Take 300 mg by mouth daily.   Yes [provider]  aspirin EC 81 MG tablet Take 1 tablet (81 mg total) by mouth daily. 10/01/18  Yes Lyda Jester M, PA-C  Cholecalciferol (VITAMIN D3) 1000 units CAPS Take 1,000 Units by mouth daily.   Yes [provider]  diphenhydrAMINE (BENADRYL) 25 MG tablet Take 25 mg by mouth daily as needed for allergies.   Yes [provider]  ezetimibe (ZETIA) 10 MG tablet Take 10 mg by mouth every morning.   Yes [provider]  fish oil-omega-3 fatty acids 1000 MG capsule Take 1 g by mouth 2 (two) times daily.    Yes [provider]  ibuprofen (ADVIL,MOTRIN) 200 MG tablet Take 400 mg by mouth 2 (two) times daily as needed for moderate pain.   Yes [provider]  levothyroxine (SYNTHROID) 75 MCG tablet Take 75 mcg by mouth daily.  09/19/19  Yes [provider]  metoprolol (LOPRESSOR) 50 MG tablet Take 25 mg by mouth 2 (two) times daily.  05/08/11  Yes [provider]  omeprazole (PRILOSEC) 20 MG capsule Take 20 mg by mouth every Monday, Wednesday, and Friday.   Yes [provider]  rosuvastatin (CRESTOR) 10 MG tablet Take 10 mg by mouth every morning.   Yes [provider]  sildenafil (VIAGRA) 100 MG tablet Take 100 mg by mouth as needed. 1 hr prior to sexual activity 05/08/19  Yes [provider]  tamsulosin (FLOMAX) 0.4 MG CAPS capsule Take 0.4 mg by mouth daily. 12/14/16  Yes [provider]  nitroGLYCERIN (NITROSTAT) 0.4 MG SL tablet Place 1 tablet (0.4 mg total) under the tongue every 5 (five) minutes as needed for chest pain. 12/31/16 10/05/19  Burnell Blanks, MD     Positive ROS: Otherwise negative  All other systems have been reviewed and were otherwise negative with the exception of those mentioned in the HPI and as above.  Physical Exam: Constitutional: Alert, well-appearing, no acute distress Ears: External ears without lesions or tenderness. Ear canals with a large amount of wax buildup in both ears right side much worse than the left.  The wax was removed with a curette.  TMs are clear bilaterally.  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 Clear nasal passages Oral: Oropharynx clear. Neck: No palpable adenopathy or masses Respiratory: Breathing comfortably  Skin: No facial/neck lesions or rash noted.  Cerumen impaction removal  Date/Time: 10/19/2020 12:28 PM Performed by: Rozetta Nunnery, MD Authorized by: Rozetta Nunnery, MD   Consent:    Consent obtained:  Verbal   Consent given by:  Patient   Risks discussed:  Pain and bleeding Procedure details:    Location:  L ear and R ear   Procedure type: curette   Post-procedure details:    Inspection:  TM  intact and canal normal   Hearing quality:  Improved   Patient tolerance of procedure:  Tolerated well, no immediate complications Comments:     Patient with a large amount of wax in the right ear canal and a moderate amount of wax in the left ear canal.  Ear canals were cleaned with a curette and the TMs were otherwise clear.    Assessment: Patient with a large amount of wax in the right ear canal and mod amount of wax in the left ear canal. He has underlying bilateral SNHL.  Plan:  He is getting a hearing test later and is planning on possibly obtaining hearing aids.  Radene Journey, MD

## 2020-11-16 DIAGNOSIS — H02834 Dermatochalasis of left upper eyelid: Secondary | ICD-10-CM | POA: Diagnosis not present

## 2020-11-16 DIAGNOSIS — H0102B Squamous blepharitis left eye, upper and lower eyelids: Secondary | ICD-10-CM | POA: Diagnosis not present

## 2020-11-16 DIAGNOSIS — H2513 Age-related nuclear cataract, bilateral: Secondary | ICD-10-CM | POA: Diagnosis not present

## 2020-11-16 DIAGNOSIS — H43813 Vitreous degeneration, bilateral: Secondary | ICD-10-CM | POA: Diagnosis not present

## 2020-11-16 DIAGNOSIS — H02831 Dermatochalasis of right upper eyelid: Secondary | ICD-10-CM | POA: Diagnosis not present

## 2020-11-16 DIAGNOSIS — H0102A Squamous blepharitis right eye, upper and lower eyelids: Secondary | ICD-10-CM | POA: Diagnosis not present

## 2020-11-17 DIAGNOSIS — E039 Hypothyroidism, unspecified: Secondary | ICD-10-CM | POA: Diagnosis not present

## 2020-11-17 DIAGNOSIS — E785 Hyperlipidemia, unspecified: Secondary | ICD-10-CM | POA: Diagnosis not present

## 2020-11-17 DIAGNOSIS — Z125 Encounter for screening for malignant neoplasm of prostate: Secondary | ICD-10-CM | POA: Diagnosis not present

## 2020-11-17 DIAGNOSIS — M109 Gout, unspecified: Secondary | ICD-10-CM | POA: Diagnosis not present

## 2020-11-23 DIAGNOSIS — I129 Hypertensive chronic kidney disease with stage 1 through stage 4 chronic kidney disease, or unspecified chronic kidney disease: Secondary | ICD-10-CM | POA: Diagnosis not present

## 2020-11-23 DIAGNOSIS — E785 Hyperlipidemia, unspecified: Secondary | ICD-10-CM | POA: Diagnosis not present

## 2020-11-23 DIAGNOSIS — J302 Other seasonal allergic rhinitis: Secondary | ICD-10-CM | POA: Diagnosis not present

## 2020-11-23 DIAGNOSIS — M1990 Unspecified osteoarthritis, unspecified site: Secondary | ICD-10-CM | POA: Diagnosis not present

## 2020-11-23 DIAGNOSIS — N183 Chronic kidney disease, stage 3 unspecified: Secondary | ICD-10-CM | POA: Diagnosis not present

## 2020-11-23 DIAGNOSIS — R82998 Other abnormal findings in urine: Secondary | ICD-10-CM | POA: Diagnosis not present

## 2020-11-23 DIAGNOSIS — E039 Hypothyroidism, unspecified: Secondary | ICD-10-CM | POA: Diagnosis not present

## 2020-11-23 DIAGNOSIS — Z0000 Encounter for general adult medical examination without abnormal findings: Secondary | ICD-10-CM | POA: Diagnosis not present

## 2020-11-23 DIAGNOSIS — M109 Gout, unspecified: Secondary | ICD-10-CM | POA: Diagnosis not present

## 2020-11-23 DIAGNOSIS — K921 Melena: Secondary | ICD-10-CM | POA: Diagnosis not present

## 2020-11-23 DIAGNOSIS — N401 Enlarged prostate with lower urinary tract symptoms: Secondary | ICD-10-CM | POA: Diagnosis not present

## 2020-11-23 DIAGNOSIS — K219 Gastro-esophageal reflux disease without esophagitis: Secondary | ICD-10-CM | POA: Diagnosis not present

## 2020-12-12 DIAGNOSIS — K921 Melena: Secondary | ICD-10-CM | POA: Diagnosis not present

## 2021-01-12 ENCOUNTER — Other Ambulatory Visit: Payer: Self-pay

## 2021-01-12 ENCOUNTER — Ambulatory Visit: Payer: Medicare HMO | Admitting: Cardiovascular Disease

## 2021-01-12 ENCOUNTER — Encounter: Payer: Self-pay | Admitting: Cardiovascular Disease

## 2021-01-12 VITALS — BP 132/70 | HR 53 | Ht 70.0 in | Wt 183.0 lb

## 2021-01-12 DIAGNOSIS — I10 Essential (primary) hypertension: Secondary | ICD-10-CM

## 2021-01-12 DIAGNOSIS — I340 Nonrheumatic mitral (valve) insufficiency: Secondary | ICD-10-CM | POA: Diagnosis not present

## 2021-01-12 DIAGNOSIS — I2510 Atherosclerotic heart disease of native coronary artery without angina pectoris: Secondary | ICD-10-CM | POA: Diagnosis not present

## 2021-01-12 NOTE — Progress Notes (Signed)
Chief Complaint  Patient presents with  . Coronary Artery Disease  . Follow-up   History of Present Illness: 80 yo male with history of CAD s/p 3V CABG 2005, HLD, mitral regurgitation, aortic regurgitation, GERD and ED here today cardiac follow up. His CABG was in September 2005. Last cath 2008 with patent SVG to intermediate, SVG to RCA and LIMA to LAD. LVEF was 55% by cath in 2008. Echo 12/20/16 with normal LV systolic function, mild AI, moderate MR. Nuclear stress test January 2018 with possible inferior wall ischemia, EKG changes with exercise. This was arranged due to dyspnea. Cardiac cath February 2018 with three vessel CAD with 3/3 patent bypass grafts. Echo November 2020 with LVEF=55-60%. Mitral valve prolapse with mild to moderate MR. Trivial aortic valve insufficiency.   He is here today for follow up. The patient denies any chest pain, dyspnea, palpitations, lower extremity edema, orthopnea, PND, dizziness, near syncope or syncope.   Primary Care Physician: Prince Solian, MD  Past Medical History:  Diagnosis Date  . Aortic insufficiency   . Arthritis    oa  . Benign prostatic hypertrophy   . Coronary artery disease    a. s/p 3V CABG 2005.  Marland Kitchen Erectile dysfunction   . GERD (gastroesophageal reflux disease)   . Gout   . Hyperlipemia   . Hypertension   . Kidney stones    kidney stones  . Mitral regurgitation   . Mitral valve prolapse   . Plantar fasciitis     Past Surgical History:  Procedure Laterality Date  . CARDIAC CATHETERIZATION N/A 01/10/2017   Procedure: Left Heart Cath and Cors/Grafts Angiography;  Surgeon: Burnell Blanks, MD;  Location: [facility];  Service: Cardiovascular;  Laterality: N/A;  . CORONARY ARTERY BYPASS GRAFT  LIMA to LAD, SVG to Intermediate, SVG to PDA   2005 cabg x 3  . Hernia surgery x 2    . TOTAL HIP ARTHROPLASTY Left 01/27/2013   Procedure: TOTAL HIP ARTHROPLASTY ANTERIOR APPROACH;  Surgeon: Mauri Pole, MD;  Location:  [facility];  Service: Orthopedics;  Laterality: Left;    Current Outpatient Medications  Medication Sig Dispense Refill  . allopurinol (ZYLOPRIM) 300 MG tablet Take 300 mg by mouth daily.    Marland Kitchen aspirin EC 81 MG tablet Take 1 tablet (81 mg total) by mouth daily. 90 tablet 3  . Cholecalciferol (VITAMIN D3) 1000 units CAPS Take 1,000 Units by mouth daily.    . diphenhydrAMINE (BENADRYL) 25 MG tablet Take 25 mg by mouth daily as needed for allergies.    Marland Kitchen ezetimibe (ZETIA) 10 MG tablet Take 10 mg by mouth every morning.    . fish oil-omega-3 fatty acids 1000 MG capsule Take 1 g by mouth 2 (two) times daily.     Marland Kitchen ibuprofen (ADVIL,MOTRIN) 200 MG tablet Take 400 mg by mouth 2 (two) times daily as needed for moderate pain.    Marland Kitchen levothyroxine (SYNTHROID) 75 MCG tablet Take 75 mcg by mouth daily.    . metoprolol (LOPRESSOR) 50 MG tablet Take 25 mg by mouth 2 (two) times daily.     Marland Kitchen omeprazole (PRILOSEC) 20 MG capsule Take 20 mg by mouth every Monday, Wednesday, and Friday.    . rosuvastatin (CRESTOR) 10 MG tablet Take 10 mg by mouth every morning.    . sildenafil (VIAGRA) 100 MG tablet Take 100 mg by mouth as needed. 1 hr prior to sexual activity    . tamsulosin (FLOMAX) 0.4 MG CAPS capsule  Take 0.4 mg by mouth daily.    . nitroGLYCERIN (NITROSTAT) 0.4 MG SL tablet Place 1 tablet (0.4 mg total) under the tongue every 5 (five) minutes as needed for chest pain. 25 tablet 6   No current facility-administered medications for this visit.    No Known Allergies  Social History   Socioeconomic History  . Marital status: Married    Spouse name: Not on file  . Number of children: Not on file  . Years of education: Not on file  . Highest education level: Not on file  Occupational History  . Occupation: Retired-banker  Tobacco Use  . Smoking status: Never Smoker  . Smokeless tobacco: Never Used  Vaping Use  . Vaping Use: Never used  Substance and Sexual Activity  . Alcohol use: Yes    Comment:  social  . Drug use: No  . Sexual activity: Not on file  Other Topics Concern  . Not on file  Social History Narrative    Positive for coronary disease in both his father as     well as a stroke in his mother.         Social Determinants of Health   Financial Resource Strain: Not on file  Food Insecurity: Not on file  Transportation Needs: Not on file  Physical Activity: Not on file  Stress: Not on file  Social Connections: Not on file  Intimate Partner Violence: Not on file    Family History  Problem Relation Age of Onset  . Heart attack Father   . Coronary artery disease Unknown     Review of Systems:  As stated in the HPI and otherwise negative.   BP 132/70   Pulse (!) 53   Ht 5\' 10"  (1.778 m)   Wt 183 lb (83 kg)   SpO2 97%   BMI 26.26 kg/m   Physical Examination: General: Well developed, well nourished, NAD  HEENT: OP clear, mucus membranes moist  SKIN: warm, dry. No rashes. Neuro: No focal deficits  Musculoskeletal: Muscle strength 5/5 all ext  Psychiatric: Mood and affect normal  Neck: No JVD, no carotid bruits, no thyromegaly, no lymphadenopathy.  Lungs:Clear bilaterally, no wheezes, rhonci, crackles Cardiovascular: Regular rate and rhythm. No murmurs, gallops or rubs. Abdomen:Soft. Bowel sounds present. Non-tender.  Extremities: No lower extremity edema. Pulses are 2 + in the bilateral DP/PT.  Echo 12/20/16: 1. Left ventricular ejection fraction, by visual estimation, is 55 to  60%. The left ventricle has normal function. There is mildly increased  left ventricular hypertrophy.  2. Left ventricular diastolic parameters are consistent with Grade I  diastolic dysfunction (impaired relaxation).  3. Global right ventricle has mildly reduced systolic function.The right  ventricular size is normal. No increase in right ventricular wall  thickness.  4. Left atrial size was moderately dilated.  5. Right atrial size was normal.  6. Moderate mitral valve  prolapse.  7. The mitral valve is abnormal. Mild to moderate mitral valve  regurgitation.  8. Posterior mitral valve leaflet prolapse.  9. The tricuspid valve is grossly normal. Tricuspid valve regurgitation  is mild.  10. The aortic valve is tricuspid. Aortic valve regurgitation is trivial.  Mild aortic valve sclerosis without stenosis.  11. The pulmonic valve was grossly normal. Pulmonic valve regurgitation is  not visualized.  12. Normal pulmonary artery systolic pressure.  13. The inferior vena cava is normal in size with greater than 50%  respiratory variability, suggesting right atrial pressure of 3 mmHg.   Cardiac  cath February 2018:  Left Main  Ost LM lesion, 70% stenosed.  Left Anterior Descending  Vessel is large.  Mid LAD lesion, 50% stenosed.  First Septal Branch  Vessel is small in size.  Second Diagonal Branch  Vessel is moderate in size.  Second Septal Branch  Vessel is small in size.  Ramus Intermedius  Vessel is large.  Ost Ramus to Ramus lesion, 100% stenosed.  Left Circumflex  Vessel is moderate in size.  First Obtuse Marginal Branch  Vessel is small in size.  Second Obtuse Marginal Branch  Vessel is small in size.  Third Obtuse Marginal Branch  Vessel is small in size.  Right Coronary Artery  Mid RCA lesion, 70% stenosed.  Graft Angiography  saphenous Graft to RPDA  SVG graft was visualized by angiography and is normal in caliber and anatomically normal.  saphenous Graft to Ramus  SVG due to known occlusion and is normal in caliber and anatomically normal.  Free LIMA Graft to Dist LAD  LIMA graft was visualized by angiography and is normal in caliber and anatomically normal.  Coronary Diagrams   Diagnostic Diagram          EKG:  EKG is ordered today.  The ekg ordered today demonstrates sinus bradycardia, rate 53 bpm  Recent Labs: No results found for requested labs within last 8760 hours.   Lipid Panel No results found for: CHOL,  TRIG, HDL, CHOLHDL, VLDL, LDLCALC, LDLDIRECT   Wt Readings from Last 3 Encounters:  01/12/21 183 lb (83 kg)  10/05/19 182 lb 12.8 oz (82.9 kg)  10/01/18 182 lb (82.6 kg)     Other studies Reviewed: Additional studies/ records that were reviewed today include: . Review of the above records demonstrates:    Assessment and Plan:   1. CAD s/p CABG without angina: He has no chest pain. Cardiac cath February 2018 with three vessel CAD with 3/3 patent bypass grafts. Will continue ASA, statin and beta blocker  2. Hyperlipidemia: Lipids followed in primary care. LDL 67 in November 2020. Continue statin  3. Mitral regurgitation: Echo November 2020 with mild to moderate MR. Repeat echo November 2022  Current medicines are reviewed at length with the patient today.  The patient does not have concerns regarding medicines.  The following changes have been made:  no change  Labs/ tests ordered today include:   Orders Placed This Encounter  Procedures  . EKG 12-Lead  . ECHOCARDIOGRAM COMPLETE    Disposition:   FU with me in 12 months   Signed, Lauree Chandler, MD 01/12/2021 10:57 AM    [facility] [address], [place], [place]  [address] Phone: [phone]; Fax: [phone]

## 2021-01-12 NOTE — Patient Instructions (Signed)
Medication Instructions:  Your physician recommends that you continue on your current medications as directed. Please refer to the Current Medication list given to you today.  *If you need a refill on your cardiac medications before your next appointment, please call your pharmacy*   Lab Work: None today   Testing/Procedures: Due in November 2022 Your physician has requested that you have an echocardiogram. Echocardiography is a painless test that uses sound waves to create images of your heart. It provides your doctor with information about the size and shape of your heart and how well your heart's chambers and valves are working. This procedure takes approximately one hour. There are no restrictions for this procedure.     Follow-Up: At [facility], you and your health needs are our priority.  As part of our continuing mission to provide you with exceptional heart care, we have created designated Provider Care Teams.  These Care Teams include your primary Cardiologist (physician) and Advanced Practice Providers (APPs -  Physician Assistants and Nurse Practitioners) who all work together to provide you with the care you need, when you need it.  We recommend signing up for the patient portal called "MyChart".  Sign up information is provided on this After Visit Summary.  MyChart is used to connect with patients for Virtual Visits (Telemedicine).  Patients are able to view lab/test results, encounter notes, upcoming appointments, etc.  Non-urgent messages can be sent to your provider as well.   To learn more about what you can do with MyChart, go to [email or web address].    Your next appointment:   1 year(s)   The format for your next appointment:   In Person  Provider:   Lauree Chandler, MD

## 2021-02-01 ENCOUNTER — Telehealth: Payer: Self-pay | Admitting: *Deleted

## 2021-02-01 NOTE — Telephone Encounter (Signed)
Dr Julianne Handler need clearance to hold aspirin in this patient prior to knee surgery.  Please respond P CV DIV PRE OP  Thanks  Prim Morace PA-C 02/01/2021 10:22 AM

## 2021-02-01 NOTE — Telephone Encounter (Signed)
   [facility] Pre-operative Risk Assessment    HEARTCARE STAFF: - Please ensure there is not already an duplicate clearance open for this procedure. - Under Visit Info/Reason for Call, type in Other and utilize the format Clearance MM/DD/YY or Clearance TBD. Do not use dashes or single digits. - If request is for dental extraction, please clarify the # of teeth to be extracted.  Request for surgical clearance:  1. What type of surgery is being performed? LEFT TOTAL KNEE ARTHROPLASTY   2. When is this surgery scheduled? 02/21/21   3. What type of clearance is required (medical clearance vs. Pharmacy clearance to hold med vs. Both)? MEDICAL  4. Are there any medications that need to be held prior to surgery and how long? ASA    5. Practice name and name of physician performing surgery? [facility]; DR. [place]   6. What is the office phone number? [phone]   7.   What is the office fax number? [phone]  8.   Anesthesia type (None, local, MAC, general) ? SPINAL   Julaine Hua 02/01/2021, 8:31 AM  _________________________________________________________________   (provider comments below)

## 2021-02-01 NOTE — Telephone Encounter (Signed)
   Primary Cardiologist: Lauree Chandler, MD  Chart reviewed and patient contacted today by phone as part of pre-operative protocol coverage. Given past medical history and time since last visit, based on ACC/AHA guidelines, William Pierce would be at acceptable risk for the planned procedure without further cardiovascular testing.   OK to hold aspirin 3-5 days pre op   The patient was advised that if he develops new symptoms prior to surgery to contact our office to arrange for a follow-up visit, and he verbalized understanding.  I will route this recommendation to the requesting party via Epic fax function and remove from pre-op pool.  Please call with questions.  Kerin Ransom, PA-C 02/01/2021, 11:09 AM

## 2021-02-01 NOTE — Telephone Encounter (Signed)
OK to hold ASA. Lauree Chandler

## 2021-02-07 NOTE — Patient Instructions (Signed)
DUE TO COVID-19 ONLY ONE VISITOR IS ALLOWED TO COME WITH YOU AND STAY IN THE WAITING ROOM ONLY DURING PRE OP AND PROCEDURE.   IF YOU WILL BE ADMITTED INTO THE HOSPITAL YOU ARE ALLOWED ONLY TWO SUPPORT PEOPLE DURING VISITATION HOURS ONLY (10AM -8PM)   . The support person(s) may change daily. . The support person(s) must pass our screening, gel in and out, and wear a mask at all times, including in the patient's room. . Patients must also wear a mask when staff or their support person are in the room.  No visitors under the age of 49. Any visitor under the age of 19 must be accompanied by an adult.    COVID SWAB TESTING MUST BE COMPLETED ON:  Friday, 02-17-21 @    [address] [place], [place] [address]  (Must self quarantine after testing. Follow instructions on handout.)        Your procedure is scheduled on: Tuesday, 02-21-21   Report to [facility] Main  Entrance    Report to admitting at 10:30 AM   Call this number if you have problems the morning of surgery [phone]   Do not eat food :After Midnight.   May have liquids until 9:50 AM day of surgery  CLEAR LIQUID DIET  Foods Allowed                                                                     Foods Excluded  Water, Black Coffee and tea, regular and decaf             liquids that you cannot  Plain Jell-O in any flavor  (No red)                                    see through such as: Fruit ices (not with fruit pulp)                                      milk, soups, orange juice              Iced Popsicles (No red)                                      All solid food                                   Apple juices Sports drinks like Gatorade (No red) Lightly seasoned clear broth or consume(fat free) Sugar, honey syrup     Complete one Ensure drink the morning of surgery at  9:50 AM  the day of surgery.       1. The day of surgery:  ? Drink ONE (1) Pre-Surgery Clear Ensure or G2 by am the morning of  surgery. Drink in one sitting. Do not sip.  ? This drink was given to you during your hospital  pre-op appointment visit. ? Nothing else to drink  after completing the  Pre-Surgery Clear Ensure or G2.          If you have questions, please contact your surgeon's office.     Oral Hygiene is also important to reduce your risk of infection.                                    Remember - BRUSH YOUR TEETH THE MORNING OF SURGERY WITH YOUR REGULAR TOOTHPASTE   Do NOT smoke after Midnight   Take these medicines the morning of surgery with A SIP OF WATER: Allopurinol, Zetia, Levothyroxine, Metoprolol, Tamsulosin, Rosuvastatin                                You may not have any metal on your body including  jewelry, and body piercings             Do not wear  lotions, powders, perfumes/cologne, or deodorant             Men may shave face and neck.   Do not bring valuables to the hospital. [place].   Contacts, dentures or bridgework may not be worn into surgery.   Bring small overnight bag day of surgery.    Special Instructions: Bring a copy of your healthcare power of attorney and living will documents  the day of surgery if you haven't scanned them in before.              Please read over the following fact sheets you were given: IF YOU HAVE QUESTIONS ABOUT YOUR PRE OP INSTRUCTIONS PLEASE CALL [phone]   [facility] - Preparing for Surgery Before surgery, you can play an important role.  Because skin is not sterile, your skin needs to be as free of germs as possible.  You can reduce the number of germs on your skin by washing with CHG (chlorahexidine gluconate) soap before surgery.  CHG is an antiseptic cleaner which kills germs and bonds with the skin to continue killing germs even after washing. Please DO NOT use if you have an allergy to CHG or antibacterial soaps.  If your skin becomes reddened/irritated stop using the CHG and inform  your nurse when you arrive at Short Stay. Do not shave (including legs and underarms) for at least 48 hours prior to the first CHG shower.  You may shave your face/neck.  Please follow these instructions carefully:  1.  Shower with CHG Soap the night before surgery and the  morning of surgery.  2.  If you choose to wash your hair, wash your hair first as usual with your normal  shampoo.  3.  After you shampoo, rinse your hair and body thoroughly to remove the shampoo.                             4.  Use CHG as you would any other liquid soap.  You can apply chg directly to the skin and wash.  Gently with a scrungie or clean washcloth.  5.  Apply the CHG Soap to your body ONLY FROM THE NECK DOWN.   Do   not use on face/ open  Wound or open sores. Avoid contact with eyes, ears mouth and   genitals (private parts).                       Wash face,  Genitals (private parts) with your normal soap.             6.  Wash thoroughly, paying special attention to the area where your    surgery  will be performed.  7.  Thoroughly rinse your body with warm water from the neck down.  8.  DO NOT shower/wash with your normal soap after using and rinsing off the CHG Soap.                9.  Pat yourself dry with a clean towel.            10.  Wear clean pajamas.            11.  Place clean sheets on your bed the night of your first shower and do not  sleep with pets. Day of Surgery : Do not apply any lotions/deodorants the morning of surgery.  Please wear clean clothes to the hospital/surgery center.  FAILURE TO FOLLOW THESE INSTRUCTIONS MAY RESULT IN THE CANCELLATION OF YOUR SURGERY  PATIENT SIGNATURE_________________________________  NURSE SIGNATURE__________________________________  ________________________________________________________________________   Adam Phenix  An incentive spirometer is a tool that can help keep your lungs clear and active. This tool  measures how well you are filling your lungs with each breath. Taking long deep breaths may help reverse or decrease the chance of developing breathing (pulmonary) problems (especially infection) following:  A long period of time when you are unable to move or be active. BEFORE THE PROCEDURE   If the spirometer includes an indicator to show your best effort, your nurse or respiratory therapist will set it to a desired goal.  If possible, sit up straight or lean slightly forward. Try not to slouch.  Hold the incentive spirometer in an upright position. INSTRUCTIONS FOR USE  1. Sit on the edge of your bed if possible, or sit up as far as you can in bed or on a chair. 2. Hold the incentive spirometer in an upright position. 3. Breathe out normally. 4. Place the mouthpiece in your mouth and seal your lips tightly around it. 5. Breathe in slowly and as deeply as possible, raising the piston or the ball toward the top of the column. 6. Hold your breath for 3-5 seconds or for as long as possible. Allow the piston or ball to fall to the bottom of the column. 7. Remove the mouthpiece from your mouth and breathe out normally. 8. Rest for a few seconds and repeat Steps 1 through 7 at least 10 times every 1-2 hours when you are awake. Take your time and take a few normal breaths between deep breaths. 9. The spirometer may include an indicator to show your best effort. Use the indicator as a goal to work toward during each repetition. 10. After each set of 10 deep breaths, practice coughing to be sure your lungs are clear. If you have an incision (the cut made at the time of surgery), support your incision when coughing by placing a pillow or rolled up towels firmly against it. Once you are able to get out of bed, walk around indoors and cough well. You may stop using the incentive spirometer when instructed by your caregiver.  RISKS AND COMPLICATIONS  Take your time  so you do not get dizzy or  light-headed.  If you are in pain, you may need to take or ask for pain medication before doing incentive spirometry. It is harder to take a deep breath if you are having pain. AFTER USE  Rest and breathe slowly and easily.  It can be helpful to keep track of a log of your progress. Your caregiver can provide you with a simple table to help with this. If you are using the spirometer at home, follow these instructions: [place] IF:   You are having difficultly using the spirometer.  You have trouble using the spirometer as often as instructed.  Your pain medication is not giving enough relief while using the spirometer.  You develop fever of 100.5 F (38.1 C) or higher. SEEK IMMEDIATE MEDICAL CARE IF:   You cough up bloody sputum that had not been present before.  You develop fever of 102 F (38.9 C) or greater.  You develop worsening pain at or near the incision site. MAKE SURE YOU:   Understand these instructions.  Will watch your condition.  Will get help right away if you are not doing well or get worse. Document Released: 04/08/2007 Document Revised: 02/18/2012 Document Reviewed: 06/09/2007 Exit[employer] Patient Information 2014 [employer], [place].   ________________________________________________________________________  WHAT IS A BLOOD TRANSFUSION? Blood Transfusion Information  A transfusion is the replacement of blood or some of its parts. Blood is made up of multiple cells which provide different functions.  Red blood cells carry oxygen and are used for blood loss replacement.  White blood cells fight against infection.  Platelets control bleeding.  Plasma helps clot blood.  Other blood products are available for specialized needs, such as hemophilia or other clotting disorders. BEFORE THE TRANSFUSION  Who gives blood for transfusions?   Healthy volunteers who are fully evaluated to make sure their blood is safe. This is blood bank  blood. Transfusion therapy is the safest it has ever been in the practice of medicine. Before blood is taken from a donor, a complete history is taken to make sure that person has no history of diseases nor engages in risky social behavior (examples are intravenous drug use or sexual activity with multiple partners). The donor's travel history is screened to minimize risk of transmitting infections, such as malaria. The donated blood is tested for signs of infectious diseases, such as HIV and hepatitis. The blood is then tested to be sure it is compatible with you in order to minimize the chance of a transfusion reaction. If you or a relative donates blood, this is often done in anticipation of surgery and is not appropriate for emergency situations. It takes many days to process the donated blood. RISKS AND COMPLICATIONS Although transfusion therapy is very safe and saves many lives, the main dangers of transfusion include:   Getting an infectious disease.  Developing a transfusion reaction. This is an allergic reaction to something in the blood you were given. Every precaution is taken to prevent this. The decision to have a blood transfusion has been considered carefully by your caregiver before blood is given. Blood is not given unless the benefits outweigh the risks. AFTER THE TRANSFUSION  Right after receiving a blood transfusion, you will usually feel much better and more energetic. This is especially true if your red blood cells have gotten low (anemic). The transfusion raises the level of the red blood cells which carry oxygen, and this usually causes an energy increase.  The  nurse administering the transfusion will monitor you carefully for complications. HOME CARE INSTRUCTIONS  No special instructions are needed after a transfusion. You may find your energy is better. Speak with your caregiver about any limitations on activity for underlying diseases you may have. SEEK MEDICAL CARE IF:    Your condition is not improving after your transfusion.  You develop redness or irritation at the intravenous (IV) site. SEEK IMMEDIATE MEDICAL CARE IF:  Any of the following symptoms occur over the next 12 hours:  Shaking chills.  You have a temperature by mouth above 102 F (38.9 C), not controlled by medicine.  Chest, back, or muscle pain.  People around you feel you are not acting correctly or are confused.  Shortness of breath or difficulty breathing.  Dizziness and fainting.  You get a rash or develop hives.  You have a decrease in urine output.  Your urine turns a dark color or changes to pink, red, or brown. Any of the following symptoms occur over the next 10 days:  You have a temperature by mouth above 102 F (38.9 C), not controlled by medicine.  Shortness of breath.  Weakness after normal activity.  The white part of the eye turns yellow (jaundice).  You have a decrease in the amount of urine or are urinating less often.  Your urine turns a dark color or changes to pink, red, or brown. Document Released: 11/23/2000 Document Revised: 02/18/2012 Document Reviewed: 07/12/2008 [facility] Patient Information 2014 [place], [place].  _______________________________________________________________________

## 2021-02-07 NOTE — Progress Notes (Signed)
COVID Vaccine Completed: Date COVID Vaccine completed: Has received booster: COVID vaccine manufacturer: [employer]  Date of COVID positive in last 90 days:  PCP - Prince Solian, MD Cardiologist - Lauree Chandler, MD  Cardiac clearance in Epic dated 02-01-21 by Kerin Ransom, PA-C  Chest x-ray -  EKG - 01-12-21 Epic Stress Test - 12-20-16 Epic ECHO - 10-12-19 Epic Cardiac Cath - 01-10-17 Epic Pacemaker/ICD device last checked:  Sleep Study -  CPAP -   Fasting Blood Sugar -  Checks Blood Sugar _____ times a day  Blood Thinner Instructions: Aspirin Instructions:  ASA 81 mg.  Ok to hold 3-5 days preop per clearance Last Dose:  Activity level:  Unable to go up a flight of stairs without symptoms   Can go up a flight of stairs and activities of daily living without stopping and without symptoms   Able to exercise without symptoms     Anesthesia review: CAD, s/p CABG 2005,  HTN,  mitral regurg  Patient denies shortness of breath, fever, cough and chest pain at PAT appointment   Patient verbalized understanding of instructions that were given to them at the PAT appointment. Patient was also instructed that they will need to review over the PAT instructions again at home before surgery.

## 2021-02-10 ENCOUNTER — Encounter (HOSPITAL_COMMUNITY)
Admission: RE | Admit: 2021-02-10 | Discharge: 2021-02-10 | Disposition: A | Discharge status: Home / Self Care | Payer: Medicare HMO | Source: Ambulatory Visit | Attending: Anesthesiology | Admitting: Anesthesiology

## 2021-02-10 DIAGNOSIS — M1712 Unilateral primary osteoarthritis, left knee: Secondary | ICD-10-CM | POA: Diagnosis not present

## 2021-02-10 DIAGNOSIS — M1711 Unilateral primary osteoarthritis, right knee: Secondary | ICD-10-CM | POA: Diagnosis not present

## 2021-02-10 DIAGNOSIS — M25561 Pain in right knee: Secondary | ICD-10-CM | POA: Diagnosis not present

## 2021-02-13 NOTE — Progress Notes (Signed)
DUE TO COVID-19 ONLY ONE VISITOR IS ALLOWED TO COME WITH YOU AND STAY IN THE WAITING ROOM ONLY DURING PRE OP AND PROCEDURE DAY OF SURGERY. THE 1 VISITOR  MAY VISIT WITH YOU AFTER SURGERY IN YOUR PRIVATE ROOM DURING VISITING HOURS ONLY!  YOU NEED TO HAVE A COVID 19 TEST ON__3/10/2021 _____ @_______ , THIS TEST MUST BE DONE BEFORE SURGERY,  COVID TESTING SITE [address] [place] [address], IT IS ON THE RIGHT GOING OUT [address] APPROXIMATELY  2 MINUTES PAST [employer] ON THE RIGHT. ONCE YOUR COVID TEST IS COMPLETED,  PLEASE BEGIN THE QUARANTINE INSTRUCTIONS AS OUTLINED IN YOUR HANDOUT.                DAMASCUS FELDPAUSCH  02/13/2021   Your procedure is scheduled on: 02/21/2021    Report to [facility] Main  Entrance   Report to admitting at   1030 AM     Call this number if you have problems the morning of surgery [phone]    REMEMBER: NO  SOLID FOOD CANDY OR GUM AFTER MIDNIGHT. CLEAR LIQUIDS UNTIL   0945am       . NOTHING BY MOUTH EXCEPT CLEAR LIQUIDS UNTIL    . PLEASE FINISH ENSURE DRINK PER SURGEON ORDER  WHICH NEEDS TO BE COMPLETED AT    0945am   .      CLEAR LIQUID DIET   Foods Allowed                                                                    Coffee and tea, regular and decaf                            Fruit ices (not with fruit pulp)                                      Iced Popsicles                                    Carbonated beverages, regular and diet                                    Cranberry, grape and apple juices Sports drinks like Gatorade Lightly seasoned clear broth or consume(fat free) Sugar, honey syrup ___________________________________________________________________      BRUSH YOUR TEETH MORNING OF SURGERY AND RINSE YOUR MOUTH OUT, NO CHEWING GUM CANDY OR MINTS.     Take these medicines the morning of surgery with A SIP OF WATER: allopurinol, synthroid, flomax   DO NOT TAKE ANY DIABETIC MEDICATIONS DAY OF  YOUR SURGERY                               You may not have any metal on your body including hair pins and              piercings  Do  not wear jewelry, make-up, lotions, powders or perfumes, deodorant             Do not wear nail polish on your fingernails.  Do not shave  48 hours prior to surgery.              Men may shave face and neck.   Do not bring valuables to the hospital. [place].  Contacts, dentures or bridgework may not be worn into surgery.  Leave suitcase in the car. After surgery it may be brought to your room.     Patients discharged the day of surgery will not be allowed to drive home. IF YOU ARE HAVING SURGERY AND GOING HOME THE SAME DAY, YOU MUST HAVE AN ADULT TO DRIVE YOU HOME AND BE WITH YOU FOR 24 HOURS. YOU MAY GO HOME BY TAXI OR UBER OR ORTHERWISE, BUT AN ADULT MUST ACCOMPANY YOU HOME AND STAY WITH YOU FOR 24 HOURS.  Name and phone number of your driver:  Special Instructions: N/A              Please read over the following fact sheets you were given: _____________________________________________________________________  [facility] - Preparing for Surgery Before surgery, you can play an important role.  Because skin is not sterile, your skin needs to be as free of germs as possible.  You can reduce the number of germs on your skin by washing with CHG (chlorahexidine gluconate) soap before surgery.  CHG is an antiseptic cleaner which kills germs and bonds with the skin to continue killing germs even after washing. Please DO NOT use if you have an allergy to CHG or antibacterial soaps.  If your skin becomes reddened/irritated stop using the CHG and inform your nurse when you arrive at Short Stay. Do not shave (including legs and underarms) for at least 48 hours prior to the first CHG shower.  You may shave your face/neck. Please follow these instructions carefully:  1.  Shower with CHG Soap the night before surgery and  the  morning of Surgery.  2.  If you choose to wash your hair, wash your hair first as usual with your  normal  shampoo.  3.  After you shampoo, rinse your hair and body thoroughly to remove the  shampoo.                           4.  Use CHG as you would any other liquid soap.  You can apply chg directly  to the skin and wash                       Gently with a scrungie or clean washcloth.  5.  Apply the CHG Soap to your body ONLY FROM THE NECK DOWN.   Do not use on face/ open                           Wound or open sores. Avoid contact with eyes, ears mouth and genitals (private parts).                       Wash face,  Genitals (private parts) with your normal soap.             6.  Wash thoroughly,  paying special attention to the area where your surgery  will be performed.  7.  Thoroughly rinse your body with warm water from the neck down.  8.  DO NOT shower/wash with your normal soap after using and rinsing off  the CHG Soap.                9.  Pat yourself dry with a clean towel.            10.  Wear clean pajamas.            11.  Place clean sheets on your bed the night of your first shower and do not  sleep with pets. Day of Surgery : Do not apply any lotions/deodorants the morning of surgery.  Please wear clean clothes to the hospital/surgery center.  FAILURE TO FOLLOW THESE INSTRUCTIONS MAY RESULT IN THE CANCELLATION OF YOUR SURGERY PATIENT SIGNATURE_________________________________  NURSE SIGNATURE__________________________________  ________________________________________________________________________

## 2021-02-15 ENCOUNTER — Encounter (HOSPITAL_COMMUNITY)
Admission: RE | Admit: 2021-02-15 | Discharge: 2021-02-15 | Disposition: A | Discharge status: Home / Self Care | Payer: Medicare HMO | Source: Ambulatory Visit | Attending: Orthopedic Surgery | Admitting: Orthopedic Surgery

## 2021-02-15 ENCOUNTER — Encounter (HOSPITAL_COMMUNITY): Payer: Self-pay

## 2021-02-15 ENCOUNTER — Other Ambulatory Visit: Payer: Self-pay

## 2021-02-15 DIAGNOSIS — Z01812 Encounter for preprocedural laboratory examination: Secondary | ICD-10-CM | POA: Insufficient documentation

## 2021-02-15 HISTORY — DX: Personal history of urinary calculi: Z87.442

## 2021-02-15 LAB — BASIC METABOLIC PANEL
Anion gap: 8 (ref 5–15)
BUN: 20 mg/dL (ref 8–23)
CO2: 23 mmol/L (ref 22–32)
Calcium: 9.2 mg/dL (ref 8.9–10.3)
Chloride: 106 mmol/L (ref 98–111)
Creatinine, Ser: 0.97 mg/dL (ref 0.61–1.24)
GFR, Estimated: >60 mL/min (ref >60)
Glucose, Bld: 164 mg/dL — ABNORMAL HIGH (ref 70–99)
Potassium: 3.6 mmol/L (ref 3.5–5.1)
Sodium: 137 mmol/L (ref 135–145)

## 2021-02-15 LAB — CBC
HCT: 46 % (ref 39.0–52.0)
Hemoglobin: 15.3 g/dL (ref 13.0–17.0)
MCH: 30.7 pg (ref 26.0–34.0)
MCHC: 33.3 g/dL (ref 30.0–36.0)
MCV: 92.4 fL (ref 80.0–100.0)
Platelets: 204 10*3/uL (ref 150–400)
RBC: 4.98 MIL/uL (ref 4.22–5.81)
RDW: 13.1 % (ref 11.5–15.5)
WBC: 6.9 10*3/uL (ref 4.0–10.5)
nRBC: 0 % (ref 0.0–0.2)

## 2021-02-15 LAB — SURGICAL PCR SCREEN
MRSA, PCR: NEGATIVE
Staphylococcus aureus: POSITIVE — AB

## 2021-02-15 NOTE — Progress Notes (Signed)
Anesthesia Review:  PCP: DR Dagmar Hait  Cardiologist : DR Blima Rich 01/12/21  Clearance- -02/01/21- telephone encounter  Chest x-ray : EKG :01/12/21 Echo : 10/2019  Stress test: 2018  Cardiac Cath : 2018  Activity level: can do a flight of stairs without difficulty  Sleep Study/ CPAP : no  Fasting Blood Sugar :      / Checks Blood Sugar -- times a day:   Blood Thinner/ Instructions /Last Dose: ASA / Instructions/ Last Dose :  81 mg Aspirin

## 2021-02-16 NOTE — Anesthesia Preprocedure Evaluation (Addendum)
Anesthesia Evaluation  Patient identified by MRN, date of birth, ID band Patient awake    Reviewed: Allergy & Precautions, NPO status , Patient's Chart, lab work & pertinent test results, reviewed documented beta blocker date and time   History of Anesthesia Complications Negative for: history of anesthetic complications  Airway Mallampati: II  TM Distance: >3 FB Neck ROM: Full    Dental no notable dental hx.    Pulmonary neg pulmonary ROS,    Pulmonary exam normal        Cardiovascular hypertension, Pt. on home beta blockers and Pt. on medications + CAD and + CABG (2005)  Normal cardiovascular exam  TTE 2020: EF 55-60%, mild LVH, grade I DD, mildly reduced RV systolic function, moderate LAE, moderate MVP/MR, mild TR   Neuro/Psych negative neurological ROS  negative psych ROS   GI/Hepatic Neg liver ROS, GERD  Medicated and Controlled,  Endo/Other  Hypothyroidism   Renal/GU negative Renal ROS  negative genitourinary   Musculoskeletal  (+) Arthritis ,   Abdominal   Peds  Hematology negative hematology ROS (+)   Anesthesia Other Findings Day of surgery medications reviewed with patient.  Reproductive/Obstetrics negative OB ROS                           Anesthesia Physical Anesthesia Plan  ASA: III  Anesthesia Plan: Spinal   Post-op Pain Management:  Regional for Post-op pain   Induction:   PONV Risk Score and Plan: Treatment may vary due to age or medical condition, Ondansetron, Propofol infusion and Dexamethasone  Airway Management Planned: Natural Airway and Simple Face Mask  Additional Equipment: None  Intra-op Plan:   Post-operative Plan:   Informed Consent: I have reviewed the patients History and Physical, chart, labs and discussed the procedure including the risks, benefits and alternatives for the proposed anesthesia with the patient or authorized representative who has  indicated his/her understanding and acceptance.       Plan Discussed with: CRNA  Anesthesia Plan Comments: (See PAT note 02/15/2021, Konrad Felix, PA-C)      Anesthesia Quick Evaluation

## 2021-02-16 NOTE — Progress Notes (Signed)
Anesthesia Chart Review   Case: [id number] Date/Time: 02/21/21 1240   Procedure: TOTAL KNEE ARTHROPLASTY (Left Knee) - 70 mins   Anesthesia type: Spinal   Pre-op diagnosis: Left knee osteoarthritis   Location: [facility] ROOM 09 / [facility]   Surgeons: Paralee Cancel, MD      DISCUSSION:80 y.o. never smoker GERD, HTN, CAD (CABG 2005), mitral regurgitation, aortic regurgitation, MVP, left knee OA scheduled for above procedure 02/21/2021 with Dr. Paralee Cancel.   Per cardiology preoperative evaluation 02/01/2021, "Chart reviewed and patient contacted today by phone as part of pre-operative protocol coverage. Given past medical history and time since last visit, based on ACC/AHA guidelines, William Pierce would be at acceptable risk for the planned procedure without further cardiovascular testing.   OK to hold aspirin 3-5 days pre op"  Anticipate pt can proceed with planned procedure barring acute status change.   VS: BP 139/64   Pulse (!) 59   Temp 36.8 C (Oral)   Resp 16   SpO2 97%   PROVIDERS: Avva, Ravisankar, MD is PCP  Lauree Chandler, MD is Cardiologist  LABS: Labs reviewed: Acceptable for surgery. (all labs ordered are listed, but only abnormal results are displayed)  Labs Reviewed  SURGICAL PCR SCREEN - Abnormal; Notable for the following components:      Result Value   Staphylococcus aureus POSITIVE (*)    All other components within normal limits  BASIC METABOLIC PANEL - Abnormal; Notable for the following components:   Glucose, Bld 164 (*)    All other components within normal limits  CBC  TYPE AND SCREEN     IMAGES:   EKG: 01/12/2021 Rate 53 bpm  Sinus bradycardia   CV: Echo 10/12/2019 IMPRESSIONS    1. Left ventricular ejection fraction, by visual estimation, is 55 to  60%. The left ventricle has normal function. There is mildly increased  left ventricular hypertrophy.  2. Left ventricular diastolic parameters are consistent with Grade I  diastolic  dysfunction (impaired relaxation).  3. Global right ventricle has mildly reduced systolic function.The right  ventricular size is normal. No increase in right ventricular wall  thickness.  4. Left atrial size was moderately dilated.  5. Right atrial size was normal.  6. Moderate mitral valve prolapse.  7. The mitral valve is abnormal. Mild to moderate mitral valve  regurgitation.  8. Posterior mitral valve leaflet prolapse.  9. The tricuspid valve is grossly normal. Tricuspid valve regurgitation  is mild.  10. The aortic valve is tricuspid. Aortic valve regurgitation is trivial.  Mild aortic valve sclerosis without stenosis.  11. The pulmonic valve was grossly normal. Pulmonic valve regurgitation is  not visualized.  12. Normal pulmonary artery systolic pressure.  13. The inferior vena cava is normal in size with greater than 50%  respiratory variability, suggesting right atrial pressure of 3 mmHg.  Cardiac Cath 01/10/2017  Mid RCA lesion, 70 %stenosed.  SVG graft was visualized by angiography and is normal in caliber and anatomically normal.  Ost Ramus to Ramus lesion, 100 %stenosed.  SVG due to known occlusion and is normal in caliber and anatomically normal.  Mid LAD lesion, 50 %stenosed.  LIMA graft was visualized by angiography and is normal in caliber and anatomically normal.  Ost LM lesion, 70 %stenosed.   1. 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  Horizontal ST segment depression ST segment depression of 2 mm was noted during stress in the  aVF, III, II, V5 and V6 leads.  Blood pressure demonstrated a hypertensive response to exercise.  Defect 1: There is a small defect of mild severity present in the basal inferior location.  Findings consistent with ischemia.  This is a low risk study.   Low risk stress nuclear study with mild inferior wall ischemia and normal left ventricular regional and  global systolic function, but with abnormal ECG response.  Past Medical History:  Diagnosis Date  . Aortic insufficiency   . Arthritis    oa  . Benign prostatic hypertrophy   . Coronary artery disease    a. s/p 3V CABG 2005.  Marland Kitchen Erectile dysfunction   . GERD (gastroesophageal reflux disease)   . Gout   . History of kidney stones   . Hyperlipemia   . Hypertension   . Mitral regurgitation   . Mitral valve prolapse   . Plantar fasciitis     Past Surgical History:  Procedure Laterality Date  . CARDIAC CATHETERIZATION N/A 01/10/2017   Procedure: Left Heart Cath and Cors/Grafts Angiography;  Surgeon: Burnell Blanks, MD;  Location: [facility];  Service: Cardiovascular;  Laterality: N/A;  . CORONARY ARTERY BYPASS GRAFT  LIMA to LAD, SVG to Intermediate, SVG to PDA   2005 cabg x 3  . Hernia surgery x 2    . TOTAL HIP ARTHROPLASTY Left 01/27/2013   Procedure: TOTAL HIP ARTHROPLASTY ANTERIOR APPROACH;  Surgeon: Mauri Pole, MD;  Location: [facility];  Service: Orthopedics;  Laterality: Left;    MEDICATIONS: . allopurinol (ZYLOPRIM) 300 MG tablet  . aspirin EC 81 MG tablet  . Cholecalciferol (VITAMIN D3) 1000 units CAPS  . diphenhydrAMINE (BENADRYL) 25 MG tablet  . ezetimibe (ZETIA) 10 MG tablet  . ibuprofen (ADVIL,MOTRIN) 200 MG tablet  . levothyroxine (SYNTHROID) 75 MCG tablet  . metoprolol (LOPRESSOR) 50 MG tablet  . nitroGLYCERIN (NITROSTAT) 0.4 MG SL tablet  . Omega-3 Fatty Acids (FISH OIL) 1200 MG CAPS  . omeprazole (PRILOSEC) 20 MG capsule  . rosuvastatin (CRESTOR) 10 MG tablet  . sildenafil (VIAGRA) 100 MG tablet  . tamsulosin (FLOMAX) 0.4 MG CAPS capsule   No current facility-administered medications for this encounter.   Konrad Felix, PA-C [facility] [phone]

## 2021-02-17 ENCOUNTER — Other Ambulatory Visit (HOSPITAL_COMMUNITY)
Admission: RE | Admit: 2021-02-17 | Discharge: 2021-02-17 | Disposition: A | Discharge status: Home / Self Care | Payer: Medicare HMO | Source: Ambulatory Visit | Attending: Orthopedic Surgery | Admitting: Orthopedic Surgery

## 2021-02-17 DIAGNOSIS — Z01812 Encounter for preprocedural laboratory examination: Secondary | ICD-10-CM | POA: Insufficient documentation

## 2021-02-17 DIAGNOSIS — Z20822 Contact with and (suspected) exposure to covid-19: Secondary | ICD-10-CM | POA: Insufficient documentation

## 2021-02-17 LAB — SARS CORONAVIRUS 2 (TAT 6-24 HRS): SARS Coronavirus 2: NEGATIVE

## 2021-02-20 NOTE — H&P (Signed)
TOTAL KNEE ADMISSION H&P  Patient is being admitted for left total knee arthroplasty.  Subjective:  Chief Complaint:left knee pain.  HPI: William Pierce, 80 y.o. male, has a history of pain and functional disability in the left knee due to arthritis and has failed non-surgical conservative treatments for greater than 12 weeks to includeNSAID's and/or analgesics, corticosteriod injections and activity modification.  Onset of symptoms was gradual, starting <1 year ago with gradually worsening course since that time. The patient noted no past surgery on the left knee(s).  Patient currently rates pain in the left knee(s) at 7 out of 10 with activity. Patient has night pain, worsening of pain with activity and weight bearing, pain that interferes with activities of daily living, pain with passive range of motion, crepitus and joint swelling.  Patient has evidence of periarticular osteophytes and joint space narrowing by imaging studies. There is no active infection.  Risks, benefits and expectations were discussed with the patient.  Risks including but not limited to the risk of anesthesia, blood clots, nerve damage, blood vessel damage, failure of the prosthesis, infection and up to and including death.  Patient understand the risks, benefits and expectations and wishes to proceed with surgery.   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 s/p 3V CABG 2005.   Erectile dysfunction    GERD (gastroesophageal reflux disease)    Gout    History of kidney stones    Hyperlipemia    Hypertension    Mitral regurgitation    Mitral valve prolapse    Plantar fasciitis     Past Surgical History:  Procedure Laterality Date   CARDIAC CATHETERIZATION N/A 01/10/2017   Procedure: Left Heart Cath and Cors/Grafts Angiography;  Surgeon: Burnell Blanks, MD;  Location: [facility];  Service: Cardiovascular;  Laterality: N/A;   CORONARY ARTERY BYPASS GRAFT  LIMA to LAD, SVG to Intermediate, SVG to PDA   2005 cabg x 3   Hernia surgery x 2     TOTAL HIP ARTHROPLASTY Left 01/27/2013   Procedure: TOTAL HIP ARTHROPLASTY ANTERIOR APPROACH;  Surgeon: Mauri Pole, MD;  Location[facility];  Service: Orthopedics;  Laterality: Left;    No current facility-administered medications for this encounter.   Current Outpatient Medications  Medication Sig Dispense Refill Last Dose   allopurinol (ZYLOPRIM) 300 MG tablet Take 300 mg by mouth daily.      aspirin EC 81 MG tablet Take 1 tablet (81 mg total) by mouth daily. 90 tablet 3  Cholecalciferol (VITAMIN D3) 1000 units CAPS Take 1,000 Units by mouth daily.      diphenhydrAMINE (BENADRYL) 25 MG tablet Take 25 mg by mouth daily as needed for allergies.      ezetimibe (ZETIA) 10 MG tablet Take 10 mg by mouth every morning.      ibuprofen (ADVIL,MOTRIN) 200 MG tablet Take 400 mg by mouth 2 (two) times daily as needed for moderate pain.      levothyroxine (SYNTHROID) 75 MCG tablet Take 75 mcg by mouth daily before breakfast.      metoprolol (LOPRESSOR) 50 MG tablet Take 25 mg by mouth 2 (two) times daily.       nitroGLYCERIN (NITROSTAT) 0.4 MG SL tablet Place 0.4 mg under the tongue every 5 (five) minutes as needed for chest pain.      Omega-3 Fatty Acids (FISH  OIL) 1200 MG CAPS Take 1,200 mg by mouth daily.      omeprazole (PRILOSEC) 20 MG capsule Take 20 mg by mouth every Monday, Wednesday, and Friday.      rosuvastatin (CRESTOR) 10 MG tablet Take 10 mg by mouth every morning.      sildenafil (VIAGRA) 100 MG tablet Take 100 mg by mouth as needed for erectile dysfunction. 1 hr prior to sexual activity      tamsulosin (FLOMAX) 0.4 MG CAPS capsule Take 0.4 mg by mouth daily.      No Known Allergies   Social History   Tobacco Use   Smoking status: Never Smoker   Smokeless tobacco: Never Used  Substance Use Topics   Alcohol use: Yes    Comment: social    Family History  Problem Relation Age of Onset   Heart attack Father    Coronary artery disease Other      Review of Systems  Constitutional: Negative.   HENT: Negative.   Eyes: Negative.   Respiratory: Negative.   Cardiovascular: Negative.   Gastrointestinal: Negative.   Genitourinary: Negative.   Musculoskeletal: Positive for joint pain.  Skin: Negative.   Neurological: Negative.   Endo/Heme/Allergies: Negative.   Psychiatric/Behavioral: Negative.     Objective:  Physical Exam Constitutional:      Appearance: He is well-developed.  HENT:     Head: Normocephalic.  Eyes:     Pupils: Pupils are equal, round, and reactive to light.  Neck:     Thyroid: No thyromegaly.     Vascular: No JVD.     Trachea: No tracheal deviation.  Cardiovascular:     Rate and Rhythm: Normal rate and regular rhythm.  Pulmonary:     Effort: Pulmonary effort is normal. No respiratory distress.     Breath sounds: Normal breath sounds. No wheezing.  Abdominal:     Palpations: Abdomen is soft.     Tenderness: There is no abdominal tenderness. There is no guarding.  Musculoskeletal:     Cervical back: Neck supple.     Left knee: Swelling and bony tenderness present. No erythema or ecchymosis. Tenderness present.  Lymphadenopathy:     Cervical: No cervical adenopathy.  Skin:    General:  Skin is warm and dry.  Neurological:     Mental Status: He is alert and oriented to person, place, and time.      Labs:   Estimated body mass index is 26.26 kg/m as calculated from the following:   Height as of 01/12/21: 5\' 10"  (1.778 m).   Weight as of 01/12/21: 83 kg.   Imaging Review Plain radiographs demonstrate severe  degenerative joint disease of the left knee(s).  The bone quality appears to be good for age and reported activity level.      Assessment/Plan:  End stage arthritis, left knee   The patient history, physical examination, clinical judgment of the provider and imaging studies are consistent with end stage degenerative joint disease of the left knee(s) and total knee arthroplasty is deemed medically necessary. The treatment options including medical management, injection therapy arthroscopy and arthroplasty were discussed at length. The risks and benefits of total knee arthroplasty were presented and reviewed. The risks due to aseptic loosening, infection, stiffness, patella tracking problems, thromboembolic complications and other imponderables were discussed. The patient acknowledged the explanation, agreed to proceed with the plan and consent was signed. Patient is being admitted for treatment for surgery, pain control, PT, OT, prophylactic antibiotics, VTE prophylaxis, progressive ambulation and ADL's and discharge planning. The patient is planning to be discharged home     Patient's anticipated LOS is less than 2 midnights, meeting these requirements: - Lives within 1 hour of care - Has a competent adult at home to recover with post-op recover - NO history of  - Chronic pain requiring opiods  - Diabetes  - Heart failure  - Heart attack  - Stroke  - DVT/VTE  - Cardiac arrhythmia  - Respiratory Failure/COPD  - Renal failure  - Anemia  - Advanced Liver disease

## 2021-02-21 ENCOUNTER — Encounter (HOSPITAL_COMMUNITY): Payer: Self-pay | Admitting: Orthopedic Surgery

## 2021-02-21 ENCOUNTER — Ambulatory Visit (HOSPITAL_COMMUNITY): Payer: Medicare HMO | Admitting: Physician Assistant

## 2021-02-21 ENCOUNTER — Other Ambulatory Visit: Payer: Self-pay

## 2021-02-21 ENCOUNTER — Observation Stay (HOSPITAL_COMMUNITY)
Admission: RE | Admit: 2021-02-21 | Discharge: 2021-02-22 | Disposition: A | Discharge status: Home / Self Care | Payer: Medicare HMO | Attending: Orthopedic Surgery | Admitting: Orthopedic Surgery

## 2021-02-21 ENCOUNTER — Ambulatory Visit (HOSPITAL_COMMUNITY): Payer: Medicare HMO | Admitting: Anesthesiology

## 2021-02-21 ENCOUNTER — Encounter (HOSPITAL_COMMUNITY)
Admission: RE | Disposition: A | Discharge status: Home / Self Care | Payer: Self-pay | Source: Home / Self Care | Attending: Orthopedic Surgery

## 2021-02-21 DIAGNOSIS — G8918 Other acute postprocedural pain: Secondary | ICD-10-CM | POA: Diagnosis not present

## 2021-02-21 DIAGNOSIS — Z79899 Other long term (current) drug therapy: Secondary | ICD-10-CM | POA: Diagnosis not present

## 2021-02-21 DIAGNOSIS — Z6825 Body mass index (BMI) 25.0-25.9, adult: Secondary | ICD-10-CM | POA: Insufficient documentation

## 2021-02-21 DIAGNOSIS — Z7982 Long term (current) use of aspirin: Secondary | ICD-10-CM | POA: Diagnosis not present

## 2021-02-21 DIAGNOSIS — I10 Essential (primary) hypertension: Secondary | ICD-10-CM | POA: Insufficient documentation

## 2021-02-21 DIAGNOSIS — Z96652 Presence of left artificial knee joint: Secondary | ICD-10-CM

## 2021-02-21 DIAGNOSIS — M1712 Unilateral primary osteoarthritis, left knee: Secondary | ICD-10-CM | POA: Diagnosis not present

## 2021-02-21 DIAGNOSIS — I2510 Atherosclerotic heart disease of native coronary artery without angina pectoris: Secondary | ICD-10-CM | POA: Diagnosis not present

## 2021-02-21 DIAGNOSIS — E663 Overweight: Secondary | ICD-10-CM | POA: Diagnosis present

## 2021-02-21 DIAGNOSIS — Z951 Presence of aortocoronary bypass graft: Secondary | ICD-10-CM | POA: Insufficient documentation

## 2021-02-21 DIAGNOSIS — M25462 Effusion, left knee: Secondary | ICD-10-CM | POA: Diagnosis not present

## 2021-02-21 DIAGNOSIS — M25762 Osteophyte, left knee: Secondary | ICD-10-CM | POA: Diagnosis not present

## 2021-02-21 HISTORY — PX: TOTAL KNEE ARTHROPLASTY: SHX125

## 2021-02-21 LAB — TYPE AND SCREEN
ABO/RH(D): O POS
Antibody Screen: NEGATIVE

## 2021-02-21 SURGERY — ARTHROPLASTY, KNEE, TOTAL
Anesthesia: Spinal | Site: Knee | Laterality: Left

## 2021-02-21 MED ORDER — BUPIVACAINE IN DEXTROSE 0.75-8.25 % IT SOLN
INTRATHECAL | Status: DC | PRN
  Administered 2021-02-21: 1.6 mL via INTRATHECAL

## 2021-02-21 MED ORDER — MAGNESIUM CITRATE PO SOLN: 1.0000 | Freq: Once | ORAL | Status: DC | PRN

## 2021-02-21 MED ORDER — METHOCARBAMOL 1000 MG/10ML IJ SOLN
500.0000 mg | Freq: Four times a day (QID) | INTRAVENOUS | Status: DC | PRN
  Filled 2021-02-21: qty 5

## 2021-02-21 MED ORDER — TRANEXAMIC ACID-NACL 1000-0.7 MG/100ML-% IV SOLN
1000.0000 mg | INTRAVENOUS | Status: AC
  Administered 2021-02-21: 1000 mg via INTRAVENOUS
  Filled 2021-02-21: qty 100

## 2021-02-21 MED ORDER — ASPIRIN 81 MG PO CHEW
81.0000 mg | CHEWABLE_TABLET | Freq: Two times a day (BID) | ORAL | Status: DC
  Administered 2021-02-21 – 2021-02-22 (×2): 81 mg via ORAL
  Filled 2021-02-21 (×2): qty 1

## 2021-02-21 MED ORDER — ALLOPURINOL 300 MG PO TABS
300.0000 mg | ORAL_TABLET | Freq: Every day | ORAL | Status: DC
  Administered 2021-02-22: 300 mg via ORAL
  Filled 2021-02-21: qty 1

## 2021-02-21 MED ORDER — PHENYLEPHRINE HCL (PRESSORS) 10 MG/ML IV SOLN
INTRAVENOUS | Status: AC
  Filled 2021-02-21: qty 2

## 2021-02-21 MED ORDER — TAMSULOSIN HCL 0.4 MG PO CAPS
0.4000 mg | ORAL_CAPSULE | Freq: Every day | ORAL | Status: DC
  Administered 2021-02-21 – 2021-02-22 (×2): 0.4 mg via ORAL
  Filled 2021-02-21 (×2): qty 1

## 2021-02-21 MED ORDER — OXYCODONE HCL 5 MG/5ML PO SOLN: 5.0000 mg | Freq: Once | ORAL | Status: DC | PRN

## 2021-02-21 MED ORDER — FENTANYL CITRATE (PF) 100 MCG/2ML IJ SOLN
25.0000 ug | INTRAMUSCULAR | Status: DC | PRN
Start: 2021-02-21 — End: 2021-02-21

## 2021-02-21 MED ORDER — OXYCODONE HCL 5 MG PO TABS: 5.0000 mg | ORAL_TABLET | Freq: Once | ORAL | Status: DC | PRN

## 2021-02-21 MED ORDER — EPHEDRINE SULFATE-NACL 50-0.9 MG/10ML-% IV SOSY
PREFILLED_SYRINGE | INTRAVENOUS | Status: DC | PRN
  Administered 2021-02-21: 10 mg via INTRAVENOUS
  Administered 2021-02-21: 5 mg via INTRAVENOUS
  Administered 2021-02-21: 10 mg via INTRAVENOUS

## 2021-02-21 MED ORDER — CEFAZOLIN SODIUM-DEXTROSE 2-4 GM/100ML-% IV SOLN
2.0000 g | INTRAVENOUS | Status: AC
Start: 2021-02-21 — End: 2021-02-21
  Administered 2021-02-21: 2 g via INTRAVENOUS
  Filled 2021-02-21: qty 100

## 2021-02-21 MED ORDER — ALUM & MAG HYDROXIDE-SIMETH 200-200-20 MG/5ML PO SUSP: 15.0000 mL | ORAL | Status: DC | PRN

## 2021-02-21 MED ORDER — NITROGLYCERIN 0.4 MG SL SUBL: 0.4000 mg | SUBLINGUAL_TABLET | SUBLINGUAL | Status: DC | PRN

## 2021-02-21 MED ORDER — MIDAZOLAM HCL 2 MG/2ML IJ SOLN: 1.0000 mg | INTRAMUSCULAR | Status: DC

## 2021-02-21 MED ORDER — PROPOFOL 10 MG/ML IV BOLUS
INTRAVENOUS | Status: DC | PRN
  Administered 2021-02-21 (×4): 20 mg via INTRAVENOUS

## 2021-02-21 MED ORDER — METOPROLOL TARTRATE 25 MG PO TABS
25.0000 mg | ORAL_TABLET | Freq: Two times a day (BID) | ORAL | Status: DC
  Administered 2021-02-21 – 2021-02-22 (×2): 25 mg via ORAL
  Filled 2021-02-21 (×2): qty 1

## 2021-02-21 MED ORDER — PROPOFOL 10 MG/ML IV BOLUS
INTRAVENOUS | Status: AC
  Filled 2021-02-21: qty 20

## 2021-02-21 MED ORDER — KETOROLAC TROMETHAMINE 30 MG/ML IJ SOLN
INTRAMUSCULAR | Status: AC
  Filled 2021-02-21: qty 1

## 2021-02-21 MED ORDER — SODIUM CHLORIDE (PF) 0.9 % IJ SOLN
INTRAMUSCULAR | Status: AC
  Filled 2021-02-21: qty 30

## 2021-02-21 MED ORDER — FERROUS SULFATE 325 (65 FE) MG PO TABS
325.0000 mg | ORAL_TABLET | Freq: Two times a day (BID) | ORAL | Status: DC
  Administered 2021-02-21 – 2021-02-22 (×2): 325 mg via ORAL
  Filled 2021-02-21 (×2): qty 1

## 2021-02-21 MED ORDER — PANTOPRAZOLE SODIUM 40 MG PO TBEC
40.0000 mg | DELAYED_RELEASE_TABLET | Freq: Every day | ORAL | Status: DC
  Administered 2021-02-21 – 2021-02-22 (×2): 40 mg via ORAL
  Filled 2021-02-21 (×2): qty 1

## 2021-02-21 MED ORDER — ACETAMINOPHEN 325 MG PO TABS: 325.0000 mg | ORAL_TABLET | Freq: Four times a day (QID) | ORAL | Status: DC | PRN

## 2021-02-21 MED ORDER — CLONIDINE HCL (ANALGESIA) 100 MCG/ML EP SOLN
EPIDURAL | Status: DC | PRN
  Administered 2021-02-21: 100 ug

## 2021-02-21 MED ORDER — CHLORHEXIDINE GLUCONATE 0.12 % MT SOLN
15.0000 mL | Freq: Once | OROMUCOSAL | Status: AC
  Administered 2021-02-21: 15 mL via OROMUCOSAL

## 2021-02-21 MED ORDER — DEXAMETHASONE SODIUM PHOSPHATE 10 MG/ML IJ SOLN
10.0000 mg | Freq: Once | INTRAMUSCULAR | Status: AC
  Administered 2021-02-21: 5 mg via INTRAVENOUS

## 2021-02-21 MED ORDER — KETOROLAC TROMETHAMINE 30 MG/ML IJ SOLN
INTRAMUSCULAR | Status: DC | PRN
  Administered 2021-02-21: 30 mg

## 2021-02-21 MED ORDER — PHENYLEPHRINE HCL-NACL 10-0.9 MG/250ML-% IV SOLN
INTRAVENOUS | Status: DC | PRN
  Administered 2021-02-21: 25 ug/min via INTRAVENOUS

## 2021-02-21 MED ORDER — LEVOTHYROXINE SODIUM 75 MCG PO TABS
75.0000 ug | ORAL_TABLET | Freq: Every day | ORAL | Status: DC
  Administered 2021-02-22: 75 ug via ORAL
  Filled 2021-02-21: qty 1

## 2021-02-21 MED ORDER — SODIUM CHLORIDE (PF) 0.9 % IJ SOLN
INTRAMUSCULAR | Status: DC | PRN
  Administered 2021-02-21: 30 mL

## 2021-02-21 MED ORDER — BUPIVACAINE-EPINEPHRINE (PF) 0.5% -1:200000 IJ SOLN
INTRAMUSCULAR | Status: DC | PRN
  Administered 2021-02-21: 15 mL via PERINEURAL

## 2021-02-21 MED ORDER — ONDANSETRON HCL 4 MG/2ML IJ SOLN
INTRAMUSCULAR | Status: DC | PRN
  Administered 2021-02-21: 4 mg via INTRAVENOUS

## 2021-02-21 MED ORDER — SODIUM CHLORIDE 0.9 % IV SOLN: INTRAVENOUS | Status: DC

## 2021-02-21 MED ORDER — METOCLOPRAMIDE HCL 5 MG/ML IJ SOLN: 5.0000 mg | Freq: Three times a day (TID) | INTRAMUSCULAR | Status: DC | PRN

## 2021-02-21 MED ORDER — PROPOFOL 500 MG/50ML IV EMUL
INTRAVENOUS | Status: AC
  Filled 2021-02-21: qty 50

## 2021-02-21 MED ORDER — METOCLOPRAMIDE HCL 5 MG PO TABS: 5.0000 mg | ORAL_TABLET | Freq: Three times a day (TID) | ORAL | Status: DC | PRN

## 2021-02-21 MED ORDER — TRANEXAMIC ACID-NACL 1000-0.7 MG/100ML-% IV SOLN
1000.0000 mg | Freq: Once | INTRAVENOUS | Status: AC
  Administered 2021-02-21: 1000 mg via INTRAVENOUS
  Filled 2021-02-21: qty 100

## 2021-02-21 MED ORDER — ACETAMINOPHEN 500 MG PO TABS
1000.0000 mg | ORAL_TABLET | Freq: Once | ORAL | Status: AC
  Administered 2021-02-21: 1000 mg via ORAL
  Filled 2021-02-21: qty 2

## 2021-02-21 MED ORDER — METHOCARBAMOL 500 MG PO TABS: 500.0000 mg | ORAL_TABLET | Freq: Four times a day (QID) | ORAL | Status: DC | PRN

## 2021-02-21 MED ORDER — ORAL CARE MOUTH RINSE: 15.0000 mL | Freq: Once | OROMUCOSAL | Status: AC

## 2021-02-21 MED ORDER — ROSUVASTATIN CALCIUM 10 MG PO TABS
10.0000 mg | ORAL_TABLET | Freq: Every morning | ORAL | Status: DC
  Administered 2021-02-22: 10 mg via ORAL
  Filled 2021-02-21: qty 1

## 2021-02-21 MED ORDER — PROMETHAZINE HCL 25 MG/ML IJ SOLN: 6.2500 mg | INTRAMUSCULAR | Status: DC | PRN

## 2021-02-21 MED ORDER — POLYETHYLENE GLYCOL 3350 17 G PO PACK
17.0000 g | PACK | Freq: Two times a day (BID) | ORAL | Status: DC
  Administered 2021-02-21 – 2021-02-22 (×2): 17 g via ORAL
  Filled 2021-02-21 (×2): qty 1

## 2021-02-21 MED ORDER — HYDROCODONE-ACETAMINOPHEN 5-325 MG PO TABS
1.0000 | ORAL_TABLET | ORAL | Status: DC | PRN
  Administered 2021-02-21 – 2021-02-22 (×4): 2 via ORAL
  Filled 2021-02-21 (×4): qty 2

## 2021-02-21 MED ORDER — FENTANYL CITRATE (PF) 100 MCG/2ML IJ SOLN
50.0000 ug | Freq: Once | INTRAMUSCULAR | Status: DC
  Filled 2021-02-21: qty 2

## 2021-02-21 MED ORDER — MENTHOL 3 MG MT LOZG: 1.0000 | LOZENGE | OROMUCOSAL | Status: DC | PRN

## 2021-02-21 MED ORDER — PROPOFOL 500 MG/50ML IV EMUL
INTRAVENOUS | Status: DC | PRN
  Administered 2021-02-21: 50 ug/kg/min via INTRAVENOUS

## 2021-02-21 MED ORDER — ONDANSETRON HCL 4 MG/2ML IJ SOLN: 4.0000 mg | Freq: Four times a day (QID) | INTRAMUSCULAR | Status: DC | PRN

## 2021-02-21 MED ORDER — BISACODYL 10 MG RE SUPP: 10.0000 mg | Freq: Every day | RECTAL | Status: DC | PRN

## 2021-02-21 MED ORDER — DEXAMETHASONE SODIUM PHOSPHATE 10 MG/ML IJ SOLN
INTRAMUSCULAR | Status: AC
  Filled 2021-02-21: qty 1

## 2021-02-21 MED ORDER — HYDROMORPHONE HCL 1 MG/ML IJ SOLN: 0.5000 mg | INTRAMUSCULAR | Status: DC | PRN

## 2021-02-21 MED ORDER — DIPHENHYDRAMINE HCL 12.5 MG/5ML PO ELIX: 12.5000 mg | ORAL_SOLUTION | ORAL | Status: DC | PRN

## 2021-02-21 MED ORDER — ONDANSETRON HCL 4 MG/2ML IJ SOLN
INTRAMUSCULAR | Status: AC
  Filled 2021-02-21: qty 2

## 2021-02-21 MED ORDER — BUPIVACAINE-EPINEPHRINE (PF) 0.25% -1:200000 IJ SOLN
INTRAMUSCULAR | Status: DC | PRN
  Administered 2021-02-21: 30 mL

## 2021-02-21 MED ORDER — PHENOL 1.4 % MT LIQD: 1.0000 | OROMUCOSAL | Status: DC | PRN

## 2021-02-21 MED ORDER — CEFAZOLIN SODIUM-DEXTROSE 2-4 GM/100ML-% IV SOLN
2.0000 g | Freq: Four times a day (QID) | INTRAVENOUS | Status: AC
  Administered 2021-02-21 – 2021-02-22 (×2): 2 g via INTRAVENOUS
  Filled 2021-02-21 (×2): qty 100

## 2021-02-21 MED ORDER — EZETIMIBE 10 MG PO TABS
10.0000 mg | ORAL_TABLET | Freq: Every morning | ORAL | Status: DC
  Administered 2021-02-22: 10 mg via ORAL
  Filled 2021-02-21: qty 1

## 2021-02-21 MED ORDER — BUPIVACAINE-EPINEPHRINE (PF) 0.25% -1:200000 IJ SOLN
INTRAMUSCULAR | Status: AC
  Filled 2021-02-21: qty 30

## 2021-02-21 MED ORDER — EPHEDRINE 5 MG/ML INJ
INTRAVENOUS | Status: AC
  Filled 2021-02-21: qty 10

## 2021-02-21 MED ORDER — HYDROCODONE-ACETAMINOPHEN 7.5-325 MG PO TABS: 1.0000 | ORAL_TABLET | ORAL | Status: DC | PRN

## 2021-02-21 MED ORDER — POVIDONE-IODINE 10 % EX SWAB
2.0000 "application " | Freq: Once | CUTANEOUS | Status: AC
  Administered 2021-02-21: 2 via TOPICAL

## 2021-02-21 MED ORDER — ONDANSETRON HCL 4 MG PO TABS: 4.0000 mg | ORAL_TABLET | Freq: Four times a day (QID) | ORAL | Status: DC | PRN

## 2021-02-21 MED ORDER — DEXAMETHASONE SODIUM PHOSPHATE 10 MG/ML IJ SOLN
10.0000 mg | Freq: Once | INTRAMUSCULAR | Status: AC
  Administered 2021-02-22: 10 mg via INTRAVENOUS
  Filled 2021-02-21: qty 1

## 2021-02-21 MED ORDER — STERILE WATER FOR IRRIGATION IR SOLN
Status: DC | PRN
  Administered 2021-02-21: 2000 mL

## 2021-02-21 MED ORDER — DIPHENHYDRAMINE HCL 25 MG PO TABS
25.0000 mg | ORAL_TABLET | Freq: Every day | ORAL | Status: DC | PRN
  Filled 2021-02-21: qty 1

## 2021-02-21 MED ORDER — 0.9 % SODIUM CHLORIDE (POUR BTL) OPTIME
TOPICAL | Status: DC | PRN
  Administered 2021-02-21: 1000 mL

## 2021-02-21 MED ORDER — DOCUSATE SODIUM 100 MG PO CAPS
100.0000 mg | ORAL_CAPSULE | Freq: Two times a day (BID) | ORAL | Status: DC
  Administered 2021-02-21 – 2021-02-22 (×2): 100 mg via ORAL
  Filled 2021-02-21 (×3): qty 1

## 2021-02-21 MED ORDER — CELECOXIB 200 MG PO CAPS
200.0000 mg | ORAL_CAPSULE | Freq: Two times a day (BID) | ORAL | Status: DC
  Administered 2021-02-21 – 2021-02-22 (×2): 200 mg via ORAL
  Filled 2021-02-21 (×2): qty 1

## 2021-02-21 MED ORDER — LACTATED RINGERS IV SOLN: INTRAVENOUS | Status: DC

## 2021-02-21 SURGICAL SUPPLY — 59 items
ADH SKN CLS APL DERMABOND .7 (GAUZE/BANDAGES/DRESSINGS) ×1
ATTUNE MED ANAT PAT 38 KNEE (Knees) ×1 IMPLANT
ATTUNE PS FEM LT SZ 7 CEM KNEE (Femur) ×1 IMPLANT
ATTUNE PSRP INSR SZ7 6 KNEE (Insert) ×1 IMPLANT
BAG SPEC THK2 15X12 ZIP CLS (MISCELLANEOUS)
BAG ZIPLOCK 12X15 (MISCELLANEOUS) IMPLANT
BASE TIBIAL ROT PLAT SZ 7 KNEE (Knees) IMPLANT
BLADE SAW SGTL 11.0X1.19X90.0M (BLADE) ×1 IMPLANT
BLADE SAW SGTL 13.0X1.19X90.0M (BLADE) ×2 IMPLANT
BLADE SURG SZ10 CARB STEEL (BLADE) ×4 IMPLANT
BNDG ELASTIC 6X5.8 VLCR STR LF (GAUZE/BANDAGES/DRESSINGS) ×2 IMPLANT
BOWL SMART MIX CTS (DISPOSABLE) ×2 IMPLANT
BSPLAT TIB 7 CMNT ROT PLAT STR (Knees) ×1 IMPLANT
CEMENT HV SMART SET (Cement) ×2 IMPLANT
COVER WAND RF STERILE (DRAPES) IMPLANT
CUFF TOURN SGL QUICK 34 (TOURNIQUET CUFF) ×2
CUFF TRNQT CYL 34X4.125X (TOURNIQUET CUFF) ×1 IMPLANT
DECANTER SPIKE VIAL GLASS SM (MISCELLANEOUS) ×6 IMPLANT
DERMABOND ADVANCED (GAUZE/BANDAGES/DRESSINGS) ×1
DERMABOND ADVANCED .7 DNX12 (GAUZE/BANDAGES/DRESSINGS) ×1 IMPLANT
DRAPE U-SHAPE 47X51 STRL (DRAPES) ×2 IMPLANT
DRESSING AQUACEL AG SP 3.5X10 (GAUZE/BANDAGES/DRESSINGS) ×1 IMPLANT
DRSG AQUACEL AG ADV 3.5X10 (GAUZE/BANDAGES/DRESSINGS) ×1 IMPLANT
DRSG AQUACEL AG SP 3.5X10 (GAUZE/BANDAGES/DRESSINGS) ×2
DURAPREP 26ML APPLICATOR (WOUND CARE) ×4 IMPLANT
ELECT REM PT RETURN 15FT ADLT (MISCELLANEOUS) ×2 IMPLANT
GLOVE ORTHO TXT STRL SZ7.5 (GLOVE) ×2 IMPLANT
GLOVE SURG ENC MOIS LTX SZ6 (GLOVE) IMPLANT
GLOVE SURG LTX SZ8 (GLOVE) ×2 IMPLANT
GLOVE SURG UNDER POLY LF SZ6.5 (GLOVE) IMPLANT
GLOVE SURG UNDER POLY LF SZ7.5 (GLOVE) ×2 IMPLANT
GLOVE SURG UNDER POLY LF SZ8.5 (GLOVE) IMPLANT
GOWN STRL REUS W/TWL 2XL LVL3 (GOWN DISPOSABLE) IMPLANT
GOWN STRL REUS W/TWL LRG LVL3 (GOWN DISPOSABLE) ×2 IMPLANT
HANDPIECE INTERPULSE COAX TIP (DISPOSABLE) ×2
HOLDER FOLEY CATH W/STRAP (MISCELLANEOUS) ×1 IMPLANT
KIT TURNOVER KIT A (KITS) ×2 IMPLANT
MANIFOLD NEPTUNE II (INSTRUMENTS) ×2 IMPLANT
NDL SAFETY ECLIPSE 18X1.5 (NEEDLE) IMPLANT
NEEDLE HYPO 18GX1.5 SHARP (NEEDLE) ×2
NS IRRIG 1000ML POUR BTL (IV SOLUTION) ×2 IMPLANT
PACK TOTAL KNEE CUSTOM (KITS) ×2 IMPLANT
PENCIL SMOKE EVACUATOR (MISCELLANEOUS) IMPLANT
PIN DRILL FIX HALF THREAD (BIT) ×1 IMPLANT
PIN FIX SIGMA LCS THRD HI (PIN) ×1 IMPLANT
PROTECTOR NERVE ULNAR (MISCELLANEOUS) ×2 IMPLANT
SET HNDPC FAN SPRY TIP SCT (DISPOSABLE) ×1 IMPLANT
SET PAD KNEE POSITIONER (MISCELLANEOUS) ×2 IMPLANT
SUT MNCRL AB 4-0 PS2 18 (SUTURE) ×2 IMPLANT
SUT STRATAFIX PDS+ 0 24IN (SUTURE) ×2 IMPLANT
SUT VIC AB 1 CT1 36 (SUTURE) ×2 IMPLANT
SUT VIC AB 2-0 CT1 27 (SUTURE) ×6
SUT VIC AB 2-0 CT1 TAPERPNT 27 (SUTURE) ×3 IMPLANT
SYR 3ML LL SCALE MARK (SYRINGE) ×2 IMPLANT
TIBIAL BASE ROT PLAT SZ 7 KNEE (Knees) ×2 IMPLANT
TRAY FOLEY MTR SLVR 16FR STAT (SET/KITS/TRAYS/PACK) ×2 IMPLANT
TUBE SUCTION HIGH CAP CLEAR NV (SUCTIONS) ×2 IMPLANT
WATER STERILE IRR 1000ML POUR (IV SOLUTION) ×4 IMPLANT
WRAP KNEE MAXI GEL POST OP (GAUZE/BANDAGES/DRESSINGS) ×2 IMPLANT

## 2021-02-21 NOTE — Care Plan (Signed)
Ortho Bundle Case Management Note  Patient Details  Name: William Pierce MRN: [id number] Date of Birth: 1941-08-24  L TKA on 02-21-21 DCP:  Home with wife.  1 story home with 3 ste. DME:  No needs.  Has a RW and 3-in-1. PT:  [facility].  PT eval scheduled on 02-24-21.                   DME Arranged:  N/A DME Agency:  NA  HH Arranged:  NA HH Agency:  NA  Additional Comments: Please contact me with any questions of if this plan should need to change.  Marianne Sofia, RN,CCM [facility]  [phone] 02/21/2021, 12:50 PM

## 2021-02-21 NOTE — Anesthesia Procedure Notes (Signed)
Anesthesia Regional Block: Adductor canal block   Pre-Anesthetic Checklist: ,, timeout performed, Correct Patient, Correct Site, Correct Laterality, Correct Procedure, Correct Position, site marked, Risks and benefits discussed, pre-op evaluation,  At surgeon's request and post-op pain management  Laterality: Left  Prep: Maximum Sterile Barrier Precautions used, chloraprep       Needles:  Injection technique: Single-shot  Needle Type: Echogenic Stimulator Needle     Needle Length: 9cm  Needle Gauge: 22     Additional Needles:   Procedures:,,,, ultrasound used (permanent image in chart),,,,  Narrative:  Start time: 02/21/2021 1:26 PM End time: 02/21/2021 1:28 PM Injection made incrementally with aspirations every 5 mL.  Performed by: Personally  Anesthesiologist: Brennan Bailey, MD  Additional Notes: Risks, benefits, and alternative discussed. Patient gave consent for procedure. Patient prepped and draped in sterile fashion. Sedation administered, patient remains easily responsive to voice. Relevant anatomy identified with ultrasound guidance. Local anesthetic given in 5cc increments with no signs or symptoms of intravascular injection. No pain or paraesthesias with injection. Patient monitored throughout procedure with signs of LAST or immediate complications. Tolerated well. Ultrasound image placed in chart.  Tawny Asal, MD

## 2021-02-21 NOTE — Anesthesia Procedure Notes (Signed)
Procedure Name: MAC Date/Time: 02/21/2021 1:37 PM Performed by: Lollie Sails, CRNA Pre-anesthesia Checklist: Patient identified, Emergency Drugs available, Suction available and Patient being monitored Oxygen Delivery Method: Simple face mask

## 2021-02-21 NOTE — Op Note (Signed)
NAME:  [name] [facility]                      MEDICAL RECORD NO.:  [id number]                             FACILITY:  [facility]      PHYSICIAN:  Pietro Cassis. Alvan Dame, M.D.  DATE OF BIRTH:  05-28-1941      DATE OF PROCEDURE:  02/21/2021                                     OPERATIVE REPORT         PREOPERATIVE DIAGNOSIS:  Left knee osteoarthritis.      POSTOPERATIVE DIAGNOSIS:  Left knee osteoarthritis.      FINDINGS:  The patient was noted to have complete loss of cartilage and   bone-on-bone arthritis with associated osteophytes in the medial and patellofemoral compartments of   the knee with a significant synovitis and associated effusion.  The patient had failed months of conservative treatment including medications, injection therapy, activity modification.     PROCEDURE:  Left total knee replacement.      COMPONENTS USED:  DePuy Attune rotating platform posterior stabilized knee   system, a size 7 femur, 7 tibia, size 6 mm PS AOX insert, and 38 anatomic patellar   button.      SURGEON:  Pietro Cassis. Alvan Dame, M.D.      ASSISTANT:  Griffith Citron, PA-C.      ANESTHESIA:  Regional and Spinal.      SPECIMENS:  None.      COMPLICATION:  None.      DRAINS:  None.  EBL: <100 vv      TOURNIQUET TIME:   Total Tourniquet Time Documented: Thigh (Left) - 39 minutes Total: Thigh (Left) - 39 minutes  .      The patient was stable to the recovery room.      INDICATION FOR PROCEDURE:  William Pierce is a 80 y.o. male patient of   mine.  The patient had been seen, evaluated, and treated for months conservatively in the   office with medication, activity modification, and injections.  The patient had   radiographic changes of bone-on-bone arthritis with endplate sclerosis and osteophytes noted.  Based on the radiographic changes and failed conservative measures, the patient   decided to proceed with definitive treatment, total knee replacement.  Risks of infection, DVT, component failure, need  for revision surgery, neurovascular injury were reviewed in the office setting.  The postop course was reviewed stressing the efforts to maximize post-operative satisfaction and function.  Consent was obtained for benefit of pain   relief.      PROCEDURE IN DETAIL:  The patient was brought to the operative theater.   Once adequate anesthesia, preoperative antibiotics, 2 gm of Ancef,1 gm of Tranexamic Acid, and 10 mg of Decadron administered, the patient was positioned supine with a left thigh tourniquet placed.  The  left lower extremity was prepped and draped in sterile fashion.  A time-   out was performed identifying the patient, planned procedure, and the appropriate extremity.      The left lower extremity was placed in the [facility] leg holder.  The leg was   exsanguinated, tourniquet elevated to 250 mmHg.  A midline incision was  made followed by median parapatellar arthrotomy.  Following initial   exposure, attention was first directed to the patella.  Precut   measurement was noted to be 24 mm.  I resected down to 14 mm and used a   38 anatomic patellar button to restore patellar height as well as cover the cut surface.      The lug holes were drilled and a metal shim was placed to protect the   patella from retractors and saw blade during the procedure.      At this point, attention was now directed to the femur.  The femoral   canal was opened with a drill, irrigated to try to prevent fat emboli.  An   intramedullary rod was passed at 5 degrees valgus, 11 mm of bone was   resected off the distal femur.  Following this resection, the tibia was   subluxated anteriorly.  Using the extramedullary guide, 2 mm of bone was resected off   the proximal medial tibia.  We confirmed the gap would be   stable medially and laterally with a size 5 spacer block as well as confirmed that the tibial cut was perpendicular in the coronal plane, checking with an alignment rod.      Once this was done,  I sized the femur to be a size 7 in the anterior-   posterior dimension, chose a standard component based on medial and   lateral dimension.  The size 7 rotation block was then pinned in   position anterior referenced using the C-clamp to set rotation.  The   anterior, posterior, and  chamfer cuts were made without difficulty nor   notching making certain that I was along the anterior cortex to help   with flexion gap stability.      The final box cut was made off the lateral aspect of distal femur.      At this point, the tibia was sized to be a size 7.  The size 7 tray was   then pinned in position through the medial third of the tubercle,   drilled, and keel punched.  Trial reduction was now carried with a 7 femur,  7 tibia, a size 6 mm PS insert, and the 38 anatomic patella botton.  The knee was brought to full extension with good flexion stability with the patella   tracking through the trochlea without application of pressure.  Given   all these findings the trial components removed.  Final components were   opened and cement was mixed.  The knee was irrigated with normal saline solution and pulse lavage.  The synovial lining was   then injected with 30 cc of 0.25% Marcaine with epinephrine, 1 cc of Toradol and 30 cc of NS for a total of 61 cc.     Final implants were then cemented onto cleaned and dried cut surfaces of bone with the knee brought to extension with a size 6 mm PS trial insert.      Once the cement had fully cured, excess cement was removed   throughout the knee.  I confirmed that I was satisfied with the range of   motion and stability, and the final size 6 mm PS AOX insert was chosen.  It was   placed into the knee.      The tourniquet had been let down at 39 minutes.  No significant   hemostasis was required.  The extensor mechanism was then reapproximated using #1 Vicryl  and #1 Stratafix sutures with the knee   in flexion.  The   remaining wound was closed with  2-0 Vicryl and running 4-0 Monocryl.   The knee was cleaned, dried, dressed sterilely using Dermabond and   Aquacel dressing.  The patient was then   brought to recovery room in stable condition, tolerating the procedure   well.   Please note that Physician Assistant, Griffith Citron, PA-C was present for the entirety of the case, and was utilized for pre-operative positioning, peri-operative retractor management, general facilitation of the procedure and for primary wound closure at the end of the case.              Pietro Cassis Alvan Dame, M.D.    02/21/2021 3:06 PM

## 2021-02-21 NOTE — Anesthesia Procedure Notes (Signed)
Spinal  Patient location during procedure: OR Start time: 02/21/2021 1:37 PM End time: 02/21/2021 1:42 PM Staffing Performed: resident/CRNA  Anesthesiologist: Brennan Bailey, MD Resident/CRNA: Lollie Sails, CRNA Preanesthetic Checklist Completed: patient identified, IV checked, site marked, risks and benefits discussed, surgical consent, monitors and equipment checked, pre-op evaluation and timeout performed Spinal Block Patient position: sitting Prep: DuraPrep and site prepped and draped Patient monitoring: heart rate, continuous pulse ox, blood pressure and cardiac monitor Approach: midline Location: L3-4 Injection technique: single-shot Needle Needle type: Pencan  Needle gauge: 24 G Needle length: 10 cm Additional Notes Expiration date on kit noted and within range. Sterile prep and drape.  Good CSF flow noted with no heme or c/o paresthesia.   Patient tolerated well.

## 2021-02-21 NOTE — Interval H&P Note (Signed)
History and Physical Interval Note:  02/21/2021 11:19 AM  William Pierce  has presented today for surgery, with the diagnosis of Left knee osteoarthritis.  The various methods of treatment have been discussed with the patient and family. After consideration of risks, benefits and other options for treatment, the patient has consented to  Procedure(s) with comments: TOTAL KNEE ARTHROPLASTY (Left) - 70 mins as a surgical intervention.  The patient's history has been reviewed, patient examined, no change in status, stable for surgery.  I have reviewed the patient's chart and labs.  Questions were answered to the patient's satisfaction.     Mauri Pole

## 2021-02-21 NOTE — Anesthesia Postprocedure Evaluation (Signed)
Anesthesia Post Note  Patient: William Pierce  Procedure(s) Performed: TOTAL KNEE ARTHROPLASTY (Left Knee)     Patient location during evaluation: PACU Anesthesia Type: Spinal Level of consciousness: awake and alert and oriented Pain management: pain level controlled Vital Signs Assessment: post-procedure vital signs reviewed and stable Respiratory status: spontaneous breathing, nonlabored ventilation and respiratory function stable Cardiovascular status: blood pressure returned to baseline Postop Assessment: no apparent nausea or vomiting, spinal receding, no headache and no backache Anesthetic complications: no   No complications documented.  Last Vitals:  Vitals:   02/21/21 1630 02/21/21 1645  BP: (!) 126/59 122/65  Pulse: (!) 57 (!) 57  Resp: 14 13  Temp:    SpO2: 99% 99%    Last Pain:  Vitals:   02/21/21 1630  TempSrc:   PainSc: 0-No pain                 Brennan Bailey

## 2021-02-21 NOTE — Progress Notes (Signed)
Assisted Dr. Howse with left, ultrasound guided, adductor canal block. Side rails up, monitors on throughout procedure. See vital signs in flow sheet. Tolerated Procedure well.  

## 2021-02-21 NOTE — Transfer of Care (Signed)
Immediate Anesthesia Transfer of Care Note  Patient: William Pierce  Procedure(s) Performed: TOTAL KNEE ARTHROPLASTY (Left Knee)  Patient Location: PACU  Anesthesia Type:Spinal and MAC combined with regional for post-op pain  Level of Consciousness: awake, alert  and patient cooperative  Airway & Oxygen Therapy: Patient Spontanous Breathing and Patient connected to face mask oxygen  Post-op Assessment: Report given to RN and Post -op Vital signs reviewed and stable  Post vital signs: Reviewed and stable  Last Vitals:  Vitals Value Taken Time  BP 104/61 02/21/21 1537  Temp    Pulse 77 02/21/21 1539  Resp 11 02/21/21 1539  SpO2 100 % 02/21/21 1539  Vitals shown include unvalidated device data.  Last Pain:  Vitals:   02/21/21 1102  TempSrc:   PainSc: 0-No pain         Complications: No complications documented.

## 2021-02-21 NOTE — Discharge Instructions (Signed)

## 2021-02-22 ENCOUNTER — Encounter (HOSPITAL_COMMUNITY): Payer: Self-pay | Admitting: Orthopedic Surgery

## 2021-02-22 DIAGNOSIS — I10 Essential (primary) hypertension: Secondary | ICD-10-CM | POA: Diagnosis not present

## 2021-02-22 DIAGNOSIS — Z6825 Body mass index (BMI) 25.0-25.9, adult: Secondary | ICD-10-CM | POA: Diagnosis not present

## 2021-02-22 DIAGNOSIS — M1712 Unilateral primary osteoarthritis, left knee: Secondary | ICD-10-CM

## 2021-02-22 DIAGNOSIS — Z79899 Other long term (current) drug therapy: Secondary | ICD-10-CM | POA: Diagnosis not present

## 2021-02-22 DIAGNOSIS — Z7982 Long term (current) use of aspirin: Secondary | ICD-10-CM | POA: Diagnosis not present

## 2021-02-22 DIAGNOSIS — I2510 Atherosclerotic heart disease of native coronary artery without angina pectoris: Secondary | ICD-10-CM | POA: Diagnosis not present

## 2021-02-22 DIAGNOSIS — E663 Overweight: Secondary | ICD-10-CM | POA: Diagnosis not present

## 2021-02-22 DIAGNOSIS — Z951 Presence of aortocoronary bypass graft: Secondary | ICD-10-CM | POA: Diagnosis not present

## 2021-02-22 LAB — CBC
HCT: 40.8 % (ref 39.0–52.0)
Hemoglobin: 13.6 g/dL (ref 13.0–17.0)
MCH: 30.8 pg (ref 26.0–34.0)
MCHC: 33.3 g/dL (ref 30.0–36.0)
MCV: 92.3 fL (ref 80.0–100.0)
Platelets: 164 10*3/uL (ref 150–400)
RBC: 4.42 MIL/uL (ref 4.22–5.81)
RDW: 12.9 % (ref 11.5–15.5)
WBC: 15.1 10*3/uL — ABNORMAL HIGH (ref 4.0–10.5)
nRBC: 0 % (ref 0.0–0.2)

## 2021-02-22 LAB — BASIC METABOLIC PANEL
Anion gap: 11 (ref 5–15)
BUN: 17 mg/dL (ref 8–23)
CO2: 22 mmol/L (ref 22–32)
Calcium: 9.3 mg/dL (ref 8.9–10.3)
Chloride: 106 mmol/L (ref 98–111)
Creatinine, Ser: 0.89 mg/dL (ref 0.61–1.24)
GFR, Estimated: >60 mL/min (ref >60)
Glucose, Bld: 160 mg/dL — ABNORMAL HIGH (ref 70–99)
Potassium: 4.5 mmol/L (ref 3.5–5.1)
Sodium: 139 mmol/L (ref 135–145)

## 2021-02-22 MED ORDER — ASPIRIN 81 MG PO CHEW: 81.0000 mg | CHEWABLE_TABLET | Freq: Two times a day (BID) | ORAL | 0 refills | Status: AC

## 2021-02-22 MED ORDER — CELECOXIB 200 MG PO CAPS: 200.0000 mg | ORAL_CAPSULE | Freq: Two times a day (BID) | ORAL | 0 refills | Status: DC

## 2021-02-22 MED ORDER — POLYETHYLENE GLYCOL 3350 17 G PO PACK: 17.0000 g | PACK | Freq: Two times a day (BID) | ORAL | 0 refills | Status: DC

## 2021-02-22 MED ORDER — HYDROCODONE-ACETAMINOPHEN 7.5-325 MG PO TABS: 1.0000 | ORAL_TABLET | ORAL | 0 refills | Status: DC | PRN

## 2021-02-22 MED ORDER — FERROUS SULFATE 325 (65 FE) MG PO TABS: 325.0000 mg | ORAL_TABLET | Freq: Three times a day (TID) | ORAL | 0 refills | Status: DC

## 2021-02-22 MED ORDER — METHOCARBAMOL 500 MG PO TABS: 500.0000 mg | ORAL_TABLET | Freq: Four times a day (QID) | ORAL | 0 refills | Status: DC | PRN

## 2021-02-22 MED ORDER — DOCUSATE SODIUM 100 MG PO CAPS: 100.0000 mg | ORAL_CAPSULE | Freq: Two times a day (BID) | ORAL | 0 refills | Status: DC

## 2021-02-22 NOTE — Evaluation (Signed)
Physical Therapy Evaluation Patient Details Name: William Pierce MRN: [id number] DOB: [birth year] Today's Date: 02/22/2021   History of Present Illness  80 y.o. male admitted 02/21/21 for L TKA. PMH of HTN, gout, CAD, plantar fasciitis  Clinical Impression  Pt ambulated 300' with RW, no loss of balance. Stair training completed. Pt demonstrates good understanding of HEP. He is ready to DC home from PT standpoint.     Follow Up Recommendations Follow surgeon's recommendation for DC plan and follow-up therapies;Outpatient PT    Equipment Recommendations  None recommended by PT    Recommendations for Other Services       Precautions / Restrictions Precautions Precautions: Fall;Knee Precaution Booklet Issued: Yes (comment) Precaution Comments: reviewed no pillow under knee Restrictions Weight Bearing Restrictions: No LLE Weight Bearing: Weight bearing as tolerated      Mobility  Bed Mobility Overal bed mobility: Modified Independent             General bed mobility comments: HOB up, used rail    Transfers Overall transfer level: Needs assistance Equipment used: Rolling walker (2 wheeled) Transfers: Sit to/from Stand Sit to Stand: Min guard;Supervision         General transfer comment: VCs hand placement  Ambulation/Gait Ambulation/Gait assistance: Supervision Gait Distance (Feet): 300 Feet Assistive device: Rolling walker (2 wheeled) Gait Pattern/deviations: Step-to pattern;Decreased step length - right;Decreased step length - left Gait velocity: decr   General Gait Details: steady with RW, no loss of balance, VCs hand placement  Stairs Stairs: Yes Stairs assistance: Min guard Stair Management: One rail Left;Forwards;Step to pattern Number of Stairs: 3 General stair comments: VCs sequencing, min/guard safety  Wheelchair Mobility    Modified Rankin (Stroke Patients Only)       Balance Overall balance assessment: Modified Independent                                            Pertinent Vitals/Pain Pain Assessment: 0-10 Pain Score: 2  Pain Location: L knee Pain Descriptors / Indicators: Sore Pain Intervention(s): Limited activity within patient's tolerance;Monitored during session;Premedicated before session;Ice applied    Home Living Family/patient expects to be discharged to:: Private residence Living Arrangements: Spouse/significant other Available Help at Discharge: Available 24 hours/day   Home Access: Stairs to enter Entrance Stairs-Rails: Left Entrance Stairs-Number of Steps: 3 Home Layout: One level Home Equipment: Walker - 2 wheels;Bedside commode      Prior Function Level of Independence: Independent         Comments: no falls in past 1 year     Hand Dominance        Extremity/Trunk Assessment   Upper Extremity Assessment Upper Extremity Assessment: Overall WFL for tasks assessed    Lower Extremity Assessment Lower Extremity Assessment: LLE deficits/detail LLE Deficits / Details: SLR 3/5, knee ext at least 3/5, knee AAROM 5-100* LLE Sensation: WNL LLE Coordination: WNL    Cervical / Trunk Assessment Cervical / Trunk Assessment: Normal  Communication   Communication: No difficulties  Cognition Arousal/Alertness: Awake/alert Behavior During Therapy: WFL for tasks assessed/performed Overall Cognitive Status: Within Functional Limits for tasks assessed                                        General Comments      Exercises  Total Joint Exercises Ankle Circles/Pumps: AROM;Both;10 reps;Supine Quad Sets: AROM;Left;5 reps;Supine Short Arc Quad: AROM;Left;5 reps;Supine Heel Slides: AAROM;Left;5 reps;Supine Hip ABduction/ADduction: AROM;Left;5 reps;Supine Straight Leg Raises: AROM;Left;5 reps;Supine Long Arc Quad: AROM;Left;5 reps;Seated Knee Flexion: AAROM;Left;10 reps;Seated Goniometric ROM: 5-100* AAROM L knee   Assessment/Plan    PT Assessment All  further PT needs can be met in the next venue of care  PT Problem List Decreased range of motion;Decreased strength;Decreased mobility;Decreased activity tolerance;Pain       PT Treatment Interventions      PT Goals (Current goals can be found in the Care Plan section)  Acute Rehab PT Goals Patient Stated Goal: golf PT Goal Formulation: All assessment and education complete, DC therapy    Frequency     Barriers to discharge        Co-evaluation               AM-PAC PT "6 Clicks" Mobility  Outcome Measure Help needed turning from your back to your side while in a flat bed without using bedrails?: None Help needed moving from lying on your back to sitting on the side of a flat bed without using bedrails?: A Little Help needed moving to and from a bed to a chair (including a wheelchair)?: None Help needed standing up from a chair using your arms (e.g., wheelchair or bedside chair)?: None Help needed to walk in hospital room?: None Help needed climbing 3-5 steps with a railing? : A Little 6 Click Score: 22    End of Session Equipment Utilized During Treatment: Gait belt Activity Tolerance: Patient tolerated treatment well Patient left: in chair;with call bell/phone within reach;with family/visitor present Nurse Communication: Mobility status PT Visit Diagnosis: Difficulty in walking, not elsewhere classified (R26.2);Pain Pain - Right/Left: Left Pain - part of body: Knee    Time: 4600-2984 PT Time Calculation (min) (ACUTE ONLY): 56 min   Charges:   PT Evaluation $PT Eval Low Complexity: 1 Low PT Treatments $Gait Training: 8-22 mins $Therapeutic Exercise: 8-22 mins $Therapeutic Activity: 8-22 mins        William Pierce PT 02/22/2021  Acute Rehabilitation Services Pager [phone] Office [phone]

## 2021-02-22 NOTE — Discharge Summary (Addendum)
Patient ID: William Pierce MRN: [id number] DOB/AGE: 81/14/1942 81 y.o.  Admit date: 02/21/2021 Discharge date: 02/22/2021  Admission Diagnoses:  Principal Problem:   Osteoarthritis of left knee Active Problems:   Overweight (BMI 25.0-29.9)   Status post total left knee replacement   Discharge Diagnoses:  Same  Past Medical History:  Diagnosis Date  . Aortic insufficiency   . Arthritis    oa  . Benign prostatic hypertrophy   . Coronary artery disease    a. s/p 3V CABG 2005.  Marland Kitchen Erectile dysfunction   . GERD (gastroesophageal reflux disease)   . Gout   . History of kidney stones   . Hyperlipemia   . Hypertension   . Mitral regurgitation   . Mitral valve prolapse   . Plantar fasciitis     Surgeries: Procedure(s): LEFT TOTAL KNEE ARTHROPLASTY on 02/21/2021   Consultants: N/A  Discharged Condition: Improved  Hospital Course: William Pierce is an 80 y.o. male who was admitted 02/21/2021 for operative treatment ofOsteoarthritis of left knee. Patient has severe unremitting pain that affects sleep, daily activities, and work/hobbies. After pre-op clearance the patient was taken to the operating room on 02/21/2021 and underwent  Procedure(s): LEFT TOTAL KNEE ARTHROPLASTY.    Patient was given perioperative antibiotics:  Anti-infectives (From admission, onward)   Start     Dose/Rate Route Frequency Ordered Stop   02/21/21 2000  ceFAZolin (ANCEF) IVPB 2g/100 mL premix        2 g 200 mL/hr over 30 Minutes Intravenous Every 6 hours 02/21/21 1705 02/22/21 0239   02/21/21 1045  ceFAZolin (ANCEF) IVPB 2g/100 mL premix        2 g 200 mL/hr over 30 Minutes Intravenous On call to O.R. 02/21/21 1040 02/21/21 1359       Patient was given sequential compression devices, early ambulation, and chemoprophylaxis to prevent DVT.  Patient benefited maximally from hospital stay and there were no complications.    Recent vital signs:  Patient Vitals for the past 24 hrs:  BP Temp Temp src  Pulse Resp SpO2 Height Weight  02/22/21 0513 129/70 98.5 F (36.9 C) Oral (!) 55 16 95 % - -  02/22/21 0128 111/60 98 F (36.7 C) Oral (!) 55 16 96 % - -  02/21/21 2222 140/65 (!) 97.4 F (36.3 C) Oral 62 17 99 % - -  02/21/21 2128 - - - 60 - - - -  02/21/21 1813 (!) 146/70 97.6 F (36.4 C) Oral (!) 54 16 100 % - -  02/21/21 1645 122/65 - - (!) 57 13 99 % - -  02/21/21 1630 (!) 126/59 - - (!) 57 14 99 % - -  02/21/21 1615 116/70 - - 61 16 98 % - -  02/21/21 1600 111/65 - - 61 (!) 21 98 % - -  02/21/21 1545 113/62 - - 72 15 100 % - -  02/21/21 1537 104/61 97.8 F (36.6 C) - 79 17 99 % - -  02/21/21 1331 (!) 157/88 - - (!) 58 - 100 % - -  02/21/21 1330 (!) 157/88 - - 62 18 100 % - -  02/21/21 1329 - - - 62 16 98 % - -  02/21/21 1328 - - - 64 14 98 % - -  02/21/21 1327 - - - 65 16 100 % - -  02/21/21 1326 (!) 155/88 - - 66 17 100 % - -  02/21/21 1325 - - - 63 13 100 % - -  02/21/21 1205 - - - 62 (!) 21 100 % - -  02/21/21 1204 - - - 61 15 100 % - -  02/21/21 1203 - - - 64 14 100 % - -  02/21/21 1202 - - - 62 16 100 % - -  02/21/21 1201 (!) 155/80 - - 60 15 98 % - -  02/21/21 1102 - - - - - - 5\' 10"  (1.778 m) 79.8 kg  02/21/21 1057 (!) 162/65 98 F (36.7 C) Oral 70 17 100 % - 81 kg     Recent laboratory studies:  Recent Labs    02/22/21 0256  WBC 15.1*  HGB 13.6  HCT 40.8  PLT 164  NA 139  K 4.5  CL 106  CO2 22  BUN 17  CREATININE 0.89  GLUCOSE 160*  CALCIUM 9.3     Discharge Medications:   Allergies as of 02/22/2021   No Known Allergies     Medication List    STOP taking these medications   aspirin EC 81 MG tablet Replaced by: aspirin 81 MG chewable tablet   ibuprofen 200 MG tablet Commonly known as: ADVIL     TAKE these medications   allopurinol 300 MG tablet Commonly known as: ZYLOPRIM Take 300 mg by mouth daily.   aspirin 81 MG chewable tablet Commonly known as: Aspirin Childrens Chew 1 tablet (81 mg total) by mouth 2 (two) times daily. Take  for 4 weeks, then resume regular dose. Replaces: aspirin EC 81 MG tablet   celecoxib 200 MG capsule Commonly known as: CeleBREX Take 1 capsule (200 mg total) by mouth 2 (two) times daily.   diphenhydrAMINE 25 MG tablet Commonly known as: BENADRYL Take 25 mg by mouth daily as needed for allergies.   docusate sodium 100 MG capsule Commonly known as: Colace Take 1 capsule (100 mg total) by mouth 2 (two) times daily.   ezetimibe 10 MG tablet Commonly known as: ZETIA Take 10 mg by mouth every morning.   ferrous sulfate 325 (65 FE) MG tablet Commonly known as: FerrouSul Take 1 tablet (325 mg total) by mouth 3 (three) times daily with meals for 14 days.   Fish Oil 1200 MG Caps Take 1,200 mg by mouth daily.   HYDROcodone-acetaminophen 7.5-325 MG tablet Commonly known as: Norco Take 1-2 tablets by mouth every 4 (four) hours as needed for moderate pain.   levothyroxine 75 MCG tablet Commonly known as: SYNTHROID Take 75 mcg by mouth daily before breakfast.   methocarbamol 500 MG tablet Commonly known as: Robaxin Take 1 tablet (500 mg total) by mouth every 6 (six) hours as needed for muscle spasms.   metoprolol tartrate 50 MG tablet Commonly known as: LOPRESSOR Take 25 mg by mouth 2 (two) times daily.   nitroGLYCERIN 0.4 MG SL tablet Commonly known as: NITROSTAT Place 0.4 mg under the tongue every 5 (five) minutes as needed for chest pain.   omeprazole 20 MG capsule Commonly known as: PRILOSEC Take 20 mg by mouth every Monday, Wednesday, and Friday.   polyethylene glycol 17 g packet Commonly known as: MIRALAX / GLYCOLAX Take 17 g by mouth 2 (two) times daily.   rosuvastatin 10 MG tablet Commonly known as: CRESTOR Take 10 mg by mouth every morning.   sildenafil 100 MG tablet Commonly known as: VIAGRA Take 100 mg by mouth as needed for erectile dysfunction. 1 hr prior to sexual activity   tamsulosin 0.4 MG Caps capsule Commonly known as: FLOMAX Take 0.4 mg by  mouth  daily.   Vitamin D3 25 MCG (1000 UT) Caps Take 1,000 Units by mouth daily.            Discharge Care Instructions  (From admission, onward)         Start     Ordered   02/22/21 0000  Change dressing       Comments: Maintain surgical dressing until follow up in the clinic. If the edges start to pull up, may reinforce with tape. If the dressing is no longer working, may remove and cover with gauze and tape, but must keep the area dry and clean.  Call with any questions or concerns.   02/22/21 0751          Diagnostic Studies: No results found.  Disposition: HOME  Discharge Instructions    Call MD / Call 911   Complete by: As directed    If you experience chest pain or shortness of breath, CALL 911 and be transported to the hospital emergency room.  If you develope a fever above 101 F, pus (white drainage) or increased drainage or redness at the wound, or calf pain, call your surgeon's office.   Change dressing   Complete by: As directed    Maintain surgical dressing until follow up in the clinic. If the edges start to pull up, may reinforce with tape. If the dressing is no longer working, may remove and cover with gauze and tape, but must keep the area dry and clean.  Call with any questions or concerns.   Constipation Prevention   Complete by: As directed    Drink plenty of fluids.  Prune juice may be helpful.  You may use a stool softener, such as Colace (over the counter) 100 mg twice a day.  Use MiraLax (over the counter) for constipation as needed.   Diet - low sodium heart healthy   Complete by: As directed    Discharge instructions   Complete by: As directed    Maintain surgical dressing until follow up in the clinic. If the edges start to pull up, may reinforce with tape. If the dressing is no longer working, may remove and cover with gauze and tape, but must keep the area dry and clean.  Follow up in 2 weeks at [facility]. Call with any questions or concerns.    Increase activity slowly as tolerated   Complete by: As directed    Weight bearing as tolerated with assist device (walker, cane, etc) as directed, use it as long as suggested by your surgeon or therapist, typically at least 4-6 weeks.   TED hose   Complete by: As directed    Use stockings (TED hose) for 2 weeks on both leg(s).  You may remove them at night for sleeping.       Follow-up Information    Paralee Cancel, MD. Go on 03/08/2021.   Specialty: Orthopedic Surgery Why: You are scheduled for a post-operative appointment on 03-08-21 at 9:00 am.  Contact information: [address] [place] [address] [phone]        Rosilyn Mings.. Go on 02/24/2021.   Why: You are scheduled for a physical therapy appointment on 02-24-21 at 9:30 am.  Contact information: [place] [address] [phone]                Signed: Lucille Passy [facility] 02/22/2021, 7:57 AM

## 2021-02-22 NOTE — TOC Transition Note (Signed)
Transition of Care [facility]) - CM/SW Discharge Note   Patient Details  Name: William Pierce MRN: [id number] Date of Birth: January 05, 1941  [facility] CM/SW Contact:  Lennart Pall, LCSW Phone Number: 02/22/2021, 10:22 AM   Clinical Narrative:    Met briefly with pt and wife and confirming pt has all needed DME.  Plan for OPPT at [facility].  No TOC needs.   Final next level of care: OP Rehab Barriers to Discharge: No Barriers Identified   Patient Goals and CMS Choice Patient states their goals for this hospitalization and ongoing recovery are:: return home      Discharge Placement                       Discharge Plan and Services                DME Arranged: N/A DME Agency: NA       HH Arranged: NA HH Agency: NA        Social Determinants of Health (SDOH) Interventions     Readmission Risk Interventions No flowsheet data found.

## 2021-02-22 NOTE — Progress Notes (Signed)
     Subjective: 1 Day Post-Op Procedure(s) (LRB): TOTAL KNEE ARTHROPLASTY (Left)   Seen by Dr. Alvan Dame. Patient reports pain as mild, pain controlled.  No reported events throughout the night.  Dr. Alvan Dame discussed the procedure, findings and expectations moving forward.  Ready to be discharged home, if they do well with PT.  Follow up in the clinic in 2 weeks.  Knows to call with any questions or concerns.     Patient's anticipated LOS is less than 2 midnights, meeting these requirements: - Lives within 1 hour of care - Has a competent adult at home to recover with post-op recover - NO history of  - Chronic pain requiring opiods  - Diabetes  - Heart failure  - Heart attack  - Stroke  - DVT/VTE  - Cardiac arrhythmia  - Respiratory Failure/COPD  - Renal failure  - Anemia  - Advanced Liver disease       Objective:   VITALS:   Vitals:   02/22/21 0128 02/22/21 0513  BP: 111/60 129/70  Pulse: (!) 55 (!) 55  Resp: 16 16  Temp: 98 F (36.7 C) 98.5 F (36.9 C)  SpO2: 96% 95%    Dorsiflexion/Plantar flexion intact Incision: dressing C/D/I No cellulitis present Compartment soft  LABS Recent Labs    02/22/21 0256  HGB 13.6  HCT 40.8  WBC 15.1*  PLT 164    Recent Labs    02/22/21 0256  NA 139  K 4.5  BUN 17  CREATININE 0.89  GLUCOSE 160*     Assessment/Plan: 1 Day Post-Op Procedure(s) (LRB): TOTAL KNEE ARTHROPLASTY (Left) Foley cath d/c'ed Advance diet Up with therapy D/C IV fluids Discharge home Follow up in 2 weeks at [facility] Follow up with OLIN,Azara Gemme D in 2 weeks.  Contact information:  [facility] [address] [place] [address] [phone]    Overweight (BMI 25-29.9) Estimated body mass index is 25.25 kg/m as calculated from the following:   Height as of this encounter: 5\' 10"  (1.778 m).   Weight as of this encounter: 79.8 kg. Patient also counseled that weight may inhibit the healing  process Patient counseled that losing weight will help with future health issues        Danae Orleans PA-C  [facility]  [place] [address], [place], [place] [address] Phone: [phone] [email or web address] Facebook  [employer]

## 2021-02-24 DIAGNOSIS — M25562 Pain in left knee: Secondary | ICD-10-CM | POA: Diagnosis not present

## 2021-02-27 DIAGNOSIS — M25562 Pain in left knee: Secondary | ICD-10-CM | POA: Diagnosis not present

## 2021-03-01 DIAGNOSIS — M25562 Pain in left knee: Secondary | ICD-10-CM | POA: Diagnosis not present

## 2021-03-06 DIAGNOSIS — M25562 Pain in left knee: Secondary | ICD-10-CM | POA: Diagnosis not present

## 2021-03-08 DIAGNOSIS — M25562 Pain in left knee: Secondary | ICD-10-CM | POA: Diagnosis not present

## 2021-03-10 DIAGNOSIS — M25562 Pain in left knee: Secondary | ICD-10-CM | POA: Diagnosis not present

## 2021-03-13 DIAGNOSIS — M25562 Pain in left knee: Secondary | ICD-10-CM | POA: Diagnosis not present

## 2021-03-15 DIAGNOSIS — M25562 Pain in left knee: Secondary | ICD-10-CM | POA: Diagnosis not present

## 2021-03-20 DIAGNOSIS — M25562 Pain in left knee: Secondary | ICD-10-CM | POA: Diagnosis not present

## 2021-03-22 DIAGNOSIS — M25562 Pain in left knee: Secondary | ICD-10-CM | POA: Diagnosis not present

## 2021-03-27 DIAGNOSIS — M25562 Pain in left knee: Secondary | ICD-10-CM | POA: Diagnosis not present

## 2021-03-29 DIAGNOSIS — M25562 Pain in left knee: Secondary | ICD-10-CM | POA: Diagnosis not present

## 2021-04-03 DIAGNOSIS — M25562 Pain in left knee: Secondary | ICD-10-CM | POA: Diagnosis not present

## 2021-04-05 DIAGNOSIS — M25562 Pain in left knee: Secondary | ICD-10-CM | POA: Diagnosis not present

## 2021-04-05 DIAGNOSIS — Z96652 Presence of left artificial knee joint: Secondary | ICD-10-CM | POA: Diagnosis not present

## 2021-04-05 DIAGNOSIS — Z471 Aftercare following joint replacement surgery: Secondary | ICD-10-CM | POA: Diagnosis not present

## 2021-05-02 ENCOUNTER — Encounter: Payer: Self-pay | Admitting: Gastroenterology

## 2021-05-02 DIAGNOSIS — M1711 Unilateral primary osteoarthritis, right knee: Secondary | ICD-10-CM | POA: Diagnosis not present

## 2021-06-15 ENCOUNTER — Ambulatory Visit: Payer: Medicare HMO | Admitting: Gastroenterology

## 2021-06-15 ENCOUNTER — Encounter: Payer: Self-pay | Admitting: Gastroenterology

## 2021-06-15 VITALS — BP 120/60 | HR 62 | Ht 70.0 in | Wt 176.0 lb

## 2021-06-15 DIAGNOSIS — Z86010 Personal history of colonic polyps: Secondary | ICD-10-CM

## 2021-06-15 DIAGNOSIS — R195 Other fecal abnormalities: Secondary | ICD-10-CM

## 2021-06-15 MED ORDER — PLENVU 140 G PO SOLR: 1.0000 | ORAL | 0 refills | Status: DC

## 2021-06-15 NOTE — Patient Instructions (Signed)
You have been scheduled for a colonoscopy. Please follow written instructions given to you at your visit today.  °Please pick up your prep supplies at the pharmacy within the next 1-3 days. °If you use inhalers (even only as needed), please bring them with you on the day of your procedure. ° °Due to recent changes in healthcare laws, you may see the results of your imaging and laboratory studies on MyChart before your provider has had a chance to review them.  We understand that in some cases there may be results that are confusing or concerning to you. Not all laboratory results come back in the same time frame and the provider may be waiting for multiple results in order to interpret others.  Please give us 48 hours in order for your provider to thoroughly review all the results before contacting the office for clarification of your results.  ° °The [facility] providers would like to encourage you to use MYCHART to communicate with providers for non-urgent requests or questions.  Due to long hold times on the telephone, sending your provider a message by MYCHART may be a faster and more efficient way to get a response.  Please allow 48 business hours for a response.  Please remember that this is for non-urgent requests.  ° °Thank you for choosing me and [facility]. ° °Malcolm T. Stark, Jr., MD., FACG ° °

## 2021-06-15 NOTE — Progress Notes (Signed)
History of Present Illness: This is an 80 year old male referred by Avva, Ravisankar, MD for the evaluation of occult blood in stool. Hemoccult positive stool in Jan 2022.  He has no gastrointestinal complaints. Denies weight loss, abdominal pain, constipation, diarrhea, change in stool caliber, melena, hematochezia, nausea, vomiting, dysphagia, reflux symptoms, chest pain.   Colonoscopy 10/2017 - Two 6 to 7 mm polyps in the transverse colon and in the cecum, removed with a cold snare. Resected and retrieved. - Moderate diverticulosis in the left colon. There was evidence of diverticular spasm. - Internal hemorrhoids. - The examination was otherwise normal on direct and retroflexion views. Path:   tubular adenoma and benign lymphoid polyp    No Known Allergies Outpatient Medications Prior to Visit  Medication Sig Dispense Refill   allopurinol (ZYLOPRIM) 300 MG tablet Take 300 mg by mouth daily.     celecoxib (CELEBREX) 200 MG capsule Take 1 capsule (200 mg total) by mouth 2 (two) times daily. 60 capsule 0   Cholecalciferol (VITAMIN D3) 1000 units CAPS Take 1,000 Units by mouth daily.     diphenhydrAMINE (BENADRYL) 25 MG tablet Take 25 mg by mouth daily as needed for allergies.     docusate sodium (COLACE) 100 MG capsule Take 1 capsule (100 mg total) by mouth 2 (two) times daily. 28 capsule 0   ezetimibe (ZETIA) 10 MG tablet Take 10 mg by mouth every morning.     HYDROcodone-acetaminophen (NORCO) 7.5-325 MG tablet Take 1-2 tablets by mouth every 4 (four) hours as needed for moderate pain. 60 tablet 0   levothyroxine (SYNTHROID) 75 MCG tablet Take 75 mcg by mouth daily before breakfast.     methocarbamol (ROBAXIN) 500 MG tablet Take 1 tablet (500 mg total) by mouth every 6 (six) hours as needed for muscle spasms. 40 tablet 0   metoprolol (LOPRESSOR) 50 MG tablet Take 25 mg by mouth 2 (two) times daily.      nitroGLYCERIN (NITROSTAT) 0.4 MG SL tablet Place 0.4 mg under the tongue every 5  (five) minutes as needed for chest pain.     Omega-3 Fatty Acids (FISH OIL) 1200 MG CAPS Take 1,200 mg by mouth daily.     omeprazole (PRILOSEC) 20 MG capsule Take 20 mg by mouth every Monday, Wednesday, and Friday.     polyethylene glycol (MIRALAX / GLYCOLAX) 17 g packet Take 17 g by mouth 2 (two) times daily. 28 packet 0   rosuvastatin (CRESTOR) 10 MG tablet Take 10 mg by mouth every morning.     sildenafil (VIAGRA) 100 MG tablet Take 100 mg by mouth as needed for erectile dysfunction. 1 hr prior to sexual activity     tamsulosin (FLOMAX) 0.4 MG CAPS capsule Take 0.4 mg by mouth daily.     ferrous sulfate (FERROUSUL) 325 (65 FE) MG tablet Take 1 tablet (325 mg total) by mouth 3 (three) times daily with meals for 14 days. 42 tablet 0   No facility-administered medications prior to visit.   Past Medical History:  Diagnosis Date   Allergic rhinitis    Aortic insufficiency    Arthritis    oa   Benign prostatic hypertrophy    Chronic kidney disease, stage 3 ([place])    Coronary artery disease    a. s/p 3V CABG 2005.   Erectile dysfunction    GERD (gastroesophageal reflux disease)    Gout    History of kidney stones    Hyperlipemia    Hypertension  Hypothyroidism    Mitral regurgitation    Mitral valve prolapse    Plantar fasciitis    Tubular adenoma of colon    Past Surgical History:  Procedure Laterality Date   CARDIAC CATHETERIZATION N/A 01/10/2017   Procedure: Left Heart Cath and Cors/Grafts Angiography;  Surgeon: Burnell Blanks, MD;  Location: [facility];  Service: Cardiovascular;  Laterality: N/A;   CORONARY ARTERY BYPASS GRAFT  LIMA to LAD, SVG to Intermediate, SVG to PDA   2005 cabg x 3   Hernia surgery x 2     TOTAL HIP ARTHROPLASTY Left 01/27/2013   Procedure: TOTAL HIP ARTHROPLASTY ANTERIOR APPROACH;  Surgeon: Mauri Pole, MD;  Location: [facility];  Service: Orthopedics;  Laterality: Left;   TOTAL KNEE ARTHROPLASTY Left 02/21/2021   Procedure: TOTAL KNEE  ARTHROPLASTY;  Surgeon: Paralee Cancel, MD;  Location: [facility];  Service: Orthopedics;  Laterality: Left;  70 mins   Social History   Socioeconomic History   Marital status: Married    Spouse name: Not on file   Number of children: Not on file   Years of education: Not on file   Highest education level: Not on file  Occupational History   Occupation: Retired-banker  Tobacco Use   Smoking status: Never   Smokeless tobacco: Never  Vaping Use   Vaping Use: Never used  Substance and Sexual Activity   Alcohol use: Yes    Comment: social   Drug use: No   Sexual activity: Not on file  Other Topics Concern   Not on file  Social History Narrative    Positive for coronary disease in both his father as     well as a stroke in his mother.         Social Determinants of Health   Financial Resource Strain: Not on file  Food Insecurity: Not on file  Transportation Needs: Not on file  Physical Activity: Not on file  Stress: Not on file  Social Connections: Not on file   Family History  Problem Relation Age of Onset   Heart attack Father    Coronary artery disease Other        Review of Systems: Pertinent positive and negative review of systems were noted in the above HPI section. All other review of systems were otherwise negative.   Physical Exam: General: Well developed, well nourished, no acute distress Head: Normocephalic and atraumatic Eyes: Sclerae anicteric, EOMI Ears: Normal auditory acuity Mouth: Not examined, mask on during Covid-19 pandemic Neck: Supple, no masses or thyromegaly Lungs: Clear throughout to auscultation Heart: Regular rate and rhythm; no murmurs, rubs or bruits Abdomen: Soft, non tender and non distended. No masses, hepatosplenomegaly or hernias noted. Normal Bowel sounds Rectal: deferred to colonoscopy  Musculoskeletal: Symmetrical with no gross deformities  Skin: No lesions on visible extremities Pulses:  Normal pulses noted Extremities: No  clubbing, cyanosis, edema or deformities noted Neurological: Alert oriented x 4, grossly nonfocal Cervical Nodes:  No significant cervical adenopathy Inguinal Nodes: No significant inguinal adenopathy Psychological:  Alert and cooperative. Normal mood and affect   Assessment and Recommendations:   Hemoccult positive stool. Personal history of adenomatous colon polyps.  Rule out hemorrhoids or colorectal neoplasms as the source of occult blood in his stool.  Schedule colonoscopy. The risks (including bleeding, perforation, infection, missed lesions, medication reactions and possible hospitalization or surgery if complications occur), benefits, and alternatives to colonoscopy with possible biopsy and possible polypectomy were discussed with the patient and  they consent to proceed.     cc: Prince Solian, MD [address] [place],  [place] [address]

## 2021-06-16 ENCOUNTER — Telehealth: Payer: Self-pay | Admitting: Gastroenterology

## 2021-06-16 NOTE — Telephone Encounter (Signed)
Inbound call from patient requesting a call back regarding colonoscopy and age along with previous physical he had. [phone]

## 2021-06-19 NOTE — Telephone Encounter (Signed)
Patient notified.  He agrees to proceed with colonoscopy as scheduled

## 2021-06-19 NOTE — Telephone Encounter (Signed)
Patient is asking if he should have an occult blood repeated prior to the colonoscopy to confirm that he is still heme positive?  He states the heme positive that generated referral was performed in Dr. Lucy Antigua office in Dec of 2021.  At the time of testing, he was having trouble with his hemorrhoids.  Dr. Fuller Plan please advise

## 2021-06-19 NOTE — Telephone Encounter (Signed)
Left message for patient to call back  

## 2021-06-19 NOTE — Telephone Encounter (Signed)
We typically do not repeat if the Hemoccult is positive. Polyps and other lesions can bleed intermittently so one positive result is significant. A negative result following a positive result on a repeat Hemoccult does not reliably exclude polyps and other lesions. My recommendation is not to repeat and to proceed with colonoscopy as planned.

## 2021-07-20 ENCOUNTER — Ambulatory Visit (AMBULATORY_SURGERY_CENTER): Payer: Medicare HMO | Admitting: Gastroenterology

## 2021-07-20 ENCOUNTER — Encounter: Payer: Self-pay | Admitting: Gastroenterology

## 2021-07-20 ENCOUNTER — Other Ambulatory Visit: Payer: Self-pay

## 2021-07-20 VITALS — BP 106/54 | HR 47 | Temp 98.4°F | Resp 17 | Ht 70.0 in | Wt 176.0 lb

## 2021-07-20 DIAGNOSIS — R195 Other fecal abnormalities: Secondary | ICD-10-CM

## 2021-07-20 DIAGNOSIS — K5730 Diverticulosis of large intestine without perforation or abscess without bleeding: Secondary | ICD-10-CM

## 2021-07-20 DIAGNOSIS — K625 Hemorrhage of anus and rectum: Secondary | ICD-10-CM | POA: Diagnosis not present

## 2021-07-20 DIAGNOSIS — D123 Benign neoplasm of transverse colon: Secondary | ICD-10-CM

## 2021-07-20 DIAGNOSIS — D122 Benign neoplasm of ascending colon: Secondary | ICD-10-CM

## 2021-07-20 DIAGNOSIS — D124 Benign neoplasm of descending colon: Secondary | ICD-10-CM

## 2021-07-20 DIAGNOSIS — K640 First degree hemorrhoids: Secondary | ICD-10-CM | POA: Diagnosis not present

## 2021-07-20 DIAGNOSIS — Z86010 Personal history of colonic polyps: Secondary | ICD-10-CM

## 2021-07-20 MED ORDER — SODIUM CHLORIDE 0.9 % IV SOLN: 500.0000 mL | Freq: Once | INTRAVENOUS | Status: DC

## 2021-07-20 NOTE — Progress Notes (Signed)
A/ox3, pleased with MAC, report to RN 

## 2021-07-20 NOTE — Progress Notes (Signed)
Pt's states no medical or surgical changes since previsit or office visit. VS assessed by C.W 

## 2021-07-20 NOTE — Op Note (Addendum)
[facility] Patient Name: William Pierce Procedure Date: 07/20/2021 3:31 PM MRN: [id number] Endoscopist: Ladene Artist , MD Age: 80 Referring MD:  Date of Birth: [birth year] Gender: Male Account #: [id number] Procedure:                Colonoscopy Indications:              Heme positive stool, Personal history of                            adenomatous colon polyps. Medicines:                Monitored Anesthesia Care Procedure:                Pre-Anesthesia Assessment:                           - Prior to the procedure, a History and Physical                            was performed, and patient medications and                            allergies were reviewed. The patient's tolerance of                            previous anesthesia was also reviewed. The risks                            and benefits of the procedure and the sedation                            options and risks were discussed with the patient.                            All questions were answered, and informed consent                            was obtained. Prior Anticoagulants: The patient has                            taken no previous anticoagulant or antiplatelet                            agents. ASA Grade Assessment: III - A patient with                            severe systemic disease. After reviewing the risks                            and benefits, the patient was deemed in                            satisfactory condition to undergo the procedure.  After obtaining informed consent, the colonoscope                            was passed under direct vision. Throughout the                            procedure, the patient's blood pressure, pulse, and                            oxygen saturations were monitored continuously. The                            PCF-HQ190L Colonoscope was introduced through the                            anus and advanced to the the cecum,  identified by                            appendiceal orifice and ileocecal valve. The                            ileocecal valve, appendiceal orifice, and rectum                            were photographed. The quality of the bowel                            preparation was good. The colonoscopy was performed                            without difficulty. The patient tolerated the                            procedure well. Scope In: 3:42:47 PM Scope Out: 4:02:41 PM Scope Withdrawal Time: 0 hours 17 minutes 15 seconds  Total Procedure Duration: 0 hours 19 minutes 54 seconds  Findings:                 The perianal and digital rectal examinations were                            normal.                           Three sessile polyps were found in the descending                            colon, hepatic flexure and ascending colon. The                            polyps were 5 to 7 mm in size. These polyps were                            removed with a cold snare. Resection and retrieval  were complete.                           A few small-mouthed diverticula were found in the                            left colon. There was no evidence of diverticular                            bleeding.                           Internal hemorrhoids were found during                            retroflexion. The hemorrhoids were moderate and                            Grade I (internal hemorrhoids that do not prolapse).                           The exam was otherwise without abnormality on                            direct and retroflexion views. Complications:            No immediate complications. Estimated blood loss:                            None. Estimated Blood Loss:     Estimated blood loss: none. Impression:               - Three 5 to 7 mm polyps in the descending colon,                            at the hepatic flexure and in the ascending colon,                             removed with a cold snare. Resected and retrieved.                           - Mild diverticulosis in the left colon.                           - Internal hemorrhoids.                           - The examination was otherwise normal on direct                            and retroflexion views. Recommendation:           - Patient has a contact number available for                            emergencies. The signs and symptoms of potential  delayed complications were discussed with the                            patient. Return to normal activities tomorrow.                            Written discharge instructions were provided to the                            patient.                           - High fiber diet.                           - Continue present medications.                           - Await pathology results.                           - No repeat colonoscopy due to age. Ladene Artist, MD 07/20/2021 4:06:48 PM This report has been signed electronically.

## 2021-07-20 NOTE — Progress Notes (Signed)
History & Physical  Primary Care Physician:  Prince Solian, MD Primary Gastroenterologist: Jerilynn Mages. Fuller Plan, MD  CHIEF COMPLAINT:  Occult blood in stool, Personal history of adenomatous colon polyps   HPI: William Pierce is a 80 y.o. male who presents for colonoscopy for evaluation of above. Please refer to June 15, 2021 office note for additional information.    Past Medical History:  Diagnosis Date   Allergic rhinitis    Aortic insufficiency    Arthritis    oa   Benign prostatic hypertrophy    Chronic kidney disease, stage 3 ([place])    Coronary artery disease    a. s/p 3V CABG 2005.   Erectile dysfunction    GERD (gastroesophageal reflux disease)    Gout    History of kidney stones    Hyperlipemia    Hypertension    Hypothyroidism    Mitral regurgitation    Mitral valve prolapse    Plantar fasciitis    Tubular adenoma of colon     Past Surgical History:  Procedure Laterality Date   CARDIAC CATHETERIZATION N/A 01/10/2017   Procedure: Left Heart Cath and Cors/Grafts Angiography;  Surgeon: Burnell Blanks, MD;  Location: [facility];  Service: Cardiovascular;  Laterality: N/A;   CORONARY ARTERY BYPASS GRAFT  LIMA to LAD, SVG to Intermediate, SVG to PDA   2005 cabg x 3   Hernia surgery x 2     TOTAL HIP ARTHROPLASTY Left 01/27/2013   Procedure: TOTAL HIP ARTHROPLASTY ANTERIOR APPROACH;  Surgeon: Mauri Pole, MD;  Location: [facility];  Service: Orthopedics;  Laterality: Left;   TOTAL KNEE ARTHROPLASTY Left 02/21/2021   Procedure: TOTAL KNEE ARTHROPLASTY;  Surgeon: Paralee Cancel, MD;  Location: [facility];  Service: Orthopedics;  Laterality: Left;  70 mins    Prior to Admission medications   Medication Sig Start Date End Date Taking? Authorizing Provider  allopurinol (ZYLOPRIM) 300 MG tablet Take 300 mg by mouth daily.   Yes [provider]  celecoxib (CELEBREX) 200 MG capsule Take 1 capsule (200 mg total) by mouth 2 (two) times daily. 02/22/21  Yes Danae Orleans, PA-C  Cholecalciferol (VITAMIN D3) 1000 units CAPS Take 1,000 Units by mouth daily.   Yes [provider]  ezetimibe (ZETIA) 10 MG tablet Take 10 mg by mouth every morning.   Yes [provider]  levothyroxine (SYNTHROID) 75 MCG tablet Take 75 mcg by mouth daily before breakfast. 09/19/19  Yes [provider]  metoprolol (LOPRESSOR) 50 MG tablet Take 25 mg by mouth 2 (two) times daily.  05/08/11  Yes [provider]  Omega-3 Fatty Acids (FISH OIL) 1200 MG CAPS Take 1,200 mg by mouth daily.   Yes [provider]  omeprazole (PRILOSEC) 20 MG capsule Take 20 mg by mouth every Monday, Wednesday, and Friday.   Yes [provider]  rosuvastatin (CRESTOR) 10 MG tablet Take 10 mg by mouth every morning.   Yes [provider]  tamsulosin (FLOMAX) 0.4 MG CAPS capsule Take 0.4 mg by mouth daily. 12/14/16  Yes [provider]  diphenhydrAMINE (BENADRYL) 25 MG tablet Take 25 mg by mouth daily as needed for allergies. Patient not taking: Reported on 07/20/2021    [provider]  docusate sodium (COLACE) 100 MG capsule Take 1 capsule (100 mg total) by mouth 2 (two) times daily. Patient not taking: Reported on 07/20/2021 02/22/21   Danae Orleans, PA-C  ferrous sulfate (FERROUSUL) 325 (65 FE) MG tablet Take 1  tablet (325 mg total) by mouth 3 (three) times daily with meals for 14 days. 02/22/21 03/08/21  Danae Orleans, PA-C  HYDROcodone-acetaminophen (NORCO) 7.5-325 MG tablet Take 1-2 tablets by mouth every 4 (four) hours as needed for moderate pain. Patient not taking: Reported on 07/20/2021 02/22/21   Danae Orleans, PA-C  methocarbamol (ROBAXIN) 500 MG tablet Take 1 tablet (500 mg total) by mouth every 6 (six) hours as needed for muscle spasms. Patient not taking: Reported on 07/20/2021 02/22/21   Danae Orleans, PA-C  nitroGLYCERIN (NITROSTAT) 0.4 MG SL tablet Place 0.4 mg under the tongue every 5 (five) minutes as needed for  chest pain. Patient not taking: Reported on 07/20/2021    [provider]  polyethylene glycol (MIRALAX / GLYCOLAX) 17 g packet Take 17 g by mouth 2 (two) times daily. Patient not taking: Reported on 07/20/2021 02/22/21   Danae Orleans, PA-C  sildenafil (VIAGRA) 100 MG tablet Take 100 mg by mouth as needed for erectile dysfunction. 1 hr prior to sexual activity Patient not taking: Reported on 07/20/2021 05/08/19   [provider]    Current Outpatient Medications  Medication Sig Dispense Refill   allopurinol (ZYLOPRIM) 300 MG tablet Take 300 mg by mouth daily.     celecoxib (CELEBREX) 200 MG capsule Take 1 capsule (200 mg total) by mouth 2 (two) times daily. 60 capsule 0   Cholecalciferol (VITAMIN D3) 1000 units CAPS Take 1,000 Units by mouth daily.     ezetimibe (ZETIA) 10 MG tablet Take 10 mg by mouth every morning.     levothyroxine (SYNTHROID) 75 MCG tablet Take 75 mcg by mouth daily before breakfast.     metoprolol (LOPRESSOR) 50 MG tablet Take 25 mg by mouth 2 (two) times daily.      Omega-3 Fatty Acids (FISH OIL) 1200 MG CAPS Take 1,200 mg by mouth daily.     omeprazole (PRILOSEC) 20 MG capsule Take 20 mg by mouth every Monday, Wednesday, and Friday.     rosuvastatin (CRESTOR) 10 MG tablet Take 10 mg by mouth every morning.     tamsulosin (FLOMAX) 0.4 MG CAPS capsule Take 0.4 mg by mouth daily.     diphenhydrAMINE (BENADRYL) 25 MG tablet Take 25 mg by mouth daily as needed for allergies. (Patient not taking: Reported on 07/20/2021)     docusate sodium (COLACE) 100 MG capsule Take 1 capsule (100 mg total) by mouth 2 (two) times daily. (Patient not taking: Reported on 07/20/2021) 28 capsule 0   ferrous sulfate (FERROUSUL) 325 (65 FE) MG tablet Take 1 tablet (325 mg total) by mouth 3 (three) times daily with meals for 14 days. 42 tablet 0   HYDROcodone-acetaminophen (NORCO) 7.5-325 MG tablet Take 1-2 tablets by mouth every 4 (four) hours as needed for moderate pain. (Patient  not taking: Reported on 07/20/2021) 60 tablet 0   methocarbamol (ROBAXIN) 500 MG tablet Take 1 tablet (500 mg total) by mouth every 6 (six) hours as needed for muscle spasms. (Patient not taking: Reported on 07/20/2021) 40 tablet 0   nitroGLYCERIN (NITROSTAT) 0.4 MG SL tablet Place 0.4 mg under the tongue every 5 (five) minutes as needed for chest pain. (Patient not taking: Reported on 07/20/2021)     polyethylene glycol (MIRALAX / GLYCOLAX) 17 g packet Take 17 g by mouth 2 (two) times daily. (Patient not taking: Reported on 07/20/2021) 28 packet 0   sildenafil (VIAGRA) 100 MG tablet Take 100 mg by mouth as needed for erectile dysfunction. 1 hr prior to  sexual activity (Patient not taking: Reported on 07/20/2021)     Current Facility-Administered Medications  Medication Dose Route Frequency Provider Last Rate Last Admin   0.9 %  sodium chloride infusion  500 mL Intravenous Once Ladene Artist, MD        Allergies as of 07/20/2021   (No Known Allergies)    Family History  Problem Relation Age of Onset   Heart attack Father    Coronary artery disease Other     Social History   Socioeconomic History   Marital status: Married    Spouse name: Not on file   Number of children: Not on file   Years of education: Not on file   Highest education level: Not on file  Occupational History   Occupation: Retired-banker  Tobacco Use   Smoking status: Never   Smokeless tobacco: Never  Vaping Use   Vaping Use: Never used  Substance and Sexual Activity   Alcohol use: Yes    Comment: social   Drug use: No   Sexual activity: Not on file  Other Topics Concern   Not on file  Social History Narrative    Positive for coronary disease in both his father as     well as a stroke in his mother.         Social Determinants of Health   Financial Resource Strain: Not on file  Food Insecurity: Not on file  Transportation Needs: Not on file  Physical Activity: Not on file  Stress: Not on file   Social Connections: Not on file  Intimate Partner Violence: Not on file    Review of Systems:  All systems reviewed an negative except where noted in HPI.  Gen: Denies any fever, chills, sweats, anorexia, fatigue, weakness, malaise, weight loss, and sleep disorder CV: Denies chest pain, angina, palpitations, syncope, orthopnea, PND, peripheral edema, and claudication. Resp: Denies dyspnea at rest, dyspnea with exercise, cough, sputum, wheezing, coughing up blood, and pleurisy. GI: Denies vomiting blood, jaundice, and fecal incontinence.   Denies dysphagia or odynophagia. GU : Denies urinary burning, blood in urine, urinary frequency, urinary hesitancy, nocturnal urination, and urinary incontinence. MS: Denies joint pain, limitation of movement, and swelling, stiffness, low back pain, extremity pain. Denies muscle weakness, cramps, atrophy.  Derm: Denies rash, itching, dry skin, hives, moles, warts, or unhealing ulcers.  Psych: Denies depression, anxiety, memory loss, suicidal ideation, hallucinations, paranoia, and confusion. Heme: Denies bruising, bleeding, and enlarged lymph nodes. Neuro:  Denies any headaches, dizziness, paresthesias. Endo:  Denies any problems with DM, thyroid, adrenal function.   Physical Exam: Vital signs in last 24 hours: General:  Alert, well-developed, in NAD Head:  Normocephalic and atraumatic. Eyes:  Sclera clear, no icterus.   Conjunctiva pink. Ears:  Normal auditory acuity. Mouth:  No deformity or lesions.  Neck:  Supple; no masses . Lungs:  Clear throughout to auscultation.   No wheezes, crackles, or rhonchi. No acute distress. Heart:  Regular rate and rhythm; no murmurs. Abdomen:  Soft, nondistended, nontender. No masses, hepatomegaly. No obvious masses.  Normal bowel .    Rectal:  Deferred   Msk:  Symmetrical without gross deformities.. Pulses:  Normal pulses noted. Extremities:  Without edema. Neurologic:  Alert and  oriented x4;  grossly normal  neurologically. Skin:  Intact without significant lesions or rashes. Cervical Nodes:  No significant cervical adenopathy. Psych:  Alert and cooperative. Normal mood and affect.   Impression / Plan:   Hemoccult positive stool. Personal history  of adenomatous colon polyps.  Rule out hemorrhoids or colorectal neoplasms as the source of occult blood in his stool.  Schedule colonoscopy.   This patient is appropriate for endoscopic procedures in the ambulatory setting.    Pricilla Riffle. Fuller Plan  07/20/2021, 3:32 PM

## 2021-07-20 NOTE — Patient Instructions (Signed)
Handouts given;  polyps, high fiber diet Start high fiber diet Continue current medications Await pathology results No repeat colonoscopy  for surveillance !!!!!  YOU HAD AN ENDOSCOPIC PROCEDURE TODAY AT [facility]:   Refer to the procedure report that was given to you for any specific questions about what was found during the examination.  If the procedure report does not answer your questions, please call your gastroenterologist to clarify.  If you requested that your care partner not be given the details of your procedure findings, then the procedure report has been included in a sealed envelope for you to review at your convenience later.  YOU SHOULD EXPECT: Some feelings of bloating in the abdomen. Passage of more gas than usual.  Walking can help get rid of the air that was put into your GI tract during the procedure and reduce the bloating. If you had a lower endoscopy (such as a colonoscopy or flexible sigmoidoscopy) you may notice spotting of blood in your stool or on the toilet paper. If you underwent a bowel prep for your procedure, you may not have a normal bowel movement for a few days.  Please Note:  You might notice some irritation and congestion in your nose or some drainage.  This is from the oxygen used during your procedure.  There is no need for concern and it should clear up in a day or so.  SYMPTOMS TO REPORT IMMEDIATELY:  Following lower endoscopy (colonoscopy or flexible sigmoidoscopy):  Excessive amounts of blood in the stool  Significant tenderness or worsening of abdominal pains  Swelling of the abdomen that is new, acute  Fever of 100F or higher  For urgent or emergent issues, a gastroenterologist can be reached at any hour by calling [phone]. Do not use MyChart messaging for urgent concerns.   DIET:  We do recommend a small meal at first, but then you may proceed to your regular diet.  Drink plenty of fluids but you should avoid alcoholic  beverages for 24 hours.  ACTIVITY:  You should plan to take it easy for the rest of today and you should NOT DRIVE or use heavy machinery until tomorrow (because of the sedation medicines used during the test).    FOLLOW UP: Our staff will call the number listed on your records 48-72 hours following your procedure to check on you and address any questions or concerns that you may have regarding the information given to you following your procedure. If we do not reach you, we will leave a message.  We will attempt to reach you two times.  During this call, we will ask if you have developed any symptoms of COVID 19. If you develop any symptoms (ie: fever, flu-like symptoms, shortness of breath, cough etc.) before then, please call [phone].  If you test positive for Covid 19 in the 2 weeks post procedure, please call and report this information to [place].    If any biopsies were taken you will be contacted by phone or by letter within the next 1-3 weeks.  Please call us at [phone] if you have not heard about the biopsies in 3 weeks.   SIGNATURES/CONFIDENTIALITY: You and/or your care partner have signed paperwork which will be entered into your electronic medical record.  These signatures attest to the fact that that the information above on your After Visit Summary has been reviewed and is understood.  Full responsibility of the confidentiality of this discharge information lies with you  and/or your care-partner.

## 2021-07-24 ENCOUNTER — Telehealth: Payer: Self-pay | Admitting: *Deleted

## 2021-07-24 NOTE — Telephone Encounter (Signed)
  Follow up Call-  Call back number 07/20/2021  Post procedure Call Back phone  # [phone]  Permission to leave phone message Yes  Some recent data might be hidden     Patient questions:  Do you have a fever, pain , or abdominal swelling? No. Pain Score  0 *  Have you tolerated food without any problems? Yes.    Have you been able to return to your normal activities? Yes.    Do you have any questions about your discharge instructions: Diet   No. Medications  No. Follow up visit  No.  Do you have questions or concerns about your Care? No.  Actions: * If pain score is 4 or above: No action needed, pain <4.  Have you developed a fever since your procedure? no  2.   Have you had an respiratory symptoms (SOB or cough) since your procedure? no  3.   Have you tested positive for COVID 19 since your procedure no  4.   Have you had any family members/close contacts diagnosed with the COVID 19 since your procedure?  no   If yes to any of these questions please route to Joylene John, RN and Joella Prince, RN

## 2021-08-03 ENCOUNTER — Encounter: Payer: Self-pay | Admitting: Gastroenterology

## 2021-09-28 DIAGNOSIS — M1711 Unilateral primary osteoarthritis, right knee: Secondary | ICD-10-CM | POA: Diagnosis not present

## 2021-09-28 DIAGNOSIS — M25561 Pain in right knee: Secondary | ICD-10-CM | POA: Diagnosis not present

## 2021-10-18 ENCOUNTER — Other Ambulatory Visit: Payer: Self-pay

## 2021-10-18 ENCOUNTER — Ambulatory Visit (HOSPITAL_COMMUNITY): Payer: Medicare HMO | Attending: Cardiology

## 2021-10-18 DIAGNOSIS — I10 Essential (primary) hypertension: Secondary | ICD-10-CM | POA: Diagnosis not present

## 2021-10-18 DIAGNOSIS — I340 Nonrheumatic mitral (valve) insufficiency: Secondary | ICD-10-CM | POA: Diagnosis not present

## 2021-10-18 DIAGNOSIS — I2510 Atherosclerotic heart disease of native coronary artery without angina pectoris: Secondary | ICD-10-CM

## 2021-10-18 LAB — ECHOCARDIOGRAM COMPLETE
Area-P 1/2: 1.88 cm2
MV M vel: 4.96 m/s
MV Peak grad: 98.4 mmHg
P 1/2 time: 622 msec
Radius: 0.6 cm
S' Lateral: 3.4 cm

## 2021-10-23 ENCOUNTER — Other Ambulatory Visit: Payer: Self-pay | Admitting: *Deleted

## 2021-10-23 DIAGNOSIS — I340 Nonrheumatic mitral (valve) insufficiency: Secondary | ICD-10-CM

## 2021-10-23 DIAGNOSIS — I2510 Atherosclerotic heart disease of native coronary artery without angina pectoris: Secondary | ICD-10-CM

## 2021-11-22 DIAGNOSIS — H02834 Dermatochalasis of left upper eyelid: Secondary | ICD-10-CM | POA: Diagnosis not present

## 2021-11-22 DIAGNOSIS — H43813 Vitreous degeneration, bilateral: Secondary | ICD-10-CM | POA: Diagnosis not present

## 2021-11-22 DIAGNOSIS — H0102B Squamous blepharitis left eye, upper and lower eyelids: Secondary | ICD-10-CM | POA: Diagnosis not present

## 2021-11-22 DIAGNOSIS — H02831 Dermatochalasis of right upper eyelid: Secondary | ICD-10-CM | POA: Diagnosis not present

## 2021-11-22 DIAGNOSIS — H0102A Squamous blepharitis right eye, upper and lower eyelids: Secondary | ICD-10-CM | POA: Diagnosis not present

## 2021-11-22 DIAGNOSIS — H2513 Age-related nuclear cataract, bilateral: Secondary | ICD-10-CM | POA: Diagnosis not present

## 2021-12-27 DIAGNOSIS — E039 Hypothyroidism, unspecified: Secondary | ICD-10-CM | POA: Diagnosis not present

## 2021-12-27 DIAGNOSIS — E785 Hyperlipidemia, unspecified: Secondary | ICD-10-CM | POA: Diagnosis not present

## 2021-12-27 DIAGNOSIS — I2510 Atherosclerotic heart disease of native coronary artery without angina pectoris: Secondary | ICD-10-CM | POA: Diagnosis not present

## 2021-12-27 DIAGNOSIS — Z125 Encounter for screening for malignant neoplasm of prostate: Secondary | ICD-10-CM | POA: Diagnosis not present

## 2021-12-27 DIAGNOSIS — M109 Gout, unspecified: Secondary | ICD-10-CM | POA: Diagnosis not present

## 2022-01-03 DIAGNOSIS — Z1339 Encounter for screening examination for other mental health and behavioral disorders: Secondary | ICD-10-CM | POA: Diagnosis not present

## 2022-01-03 DIAGNOSIS — K219 Gastro-esophageal reflux disease without esophagitis: Secondary | ICD-10-CM | POA: Diagnosis not present

## 2022-01-03 DIAGNOSIS — Z23 Encounter for immunization: Secondary | ICD-10-CM | POA: Diagnosis not present

## 2022-01-03 DIAGNOSIS — M109 Gout, unspecified: Secondary | ICD-10-CM | POA: Diagnosis not present

## 2022-01-03 DIAGNOSIS — Z1331 Encounter for screening for depression: Secondary | ICD-10-CM | POA: Diagnosis not present

## 2022-01-03 DIAGNOSIS — Z1212 Encounter for screening for malignant neoplasm of rectum: Secondary | ICD-10-CM | POA: Diagnosis not present

## 2022-01-03 DIAGNOSIS — I10 Essential (primary) hypertension: Secondary | ICD-10-CM | POA: Diagnosis not present

## 2022-01-03 DIAGNOSIS — E785 Hyperlipidemia, unspecified: Secondary | ICD-10-CM | POA: Diagnosis not present

## 2022-01-03 DIAGNOSIS — Z0000 Encounter for general adult medical examination without abnormal findings: Secondary | ICD-10-CM | POA: Diagnosis not present

## 2022-01-03 DIAGNOSIS — I2510 Atherosclerotic heart disease of native coronary artery without angina pectoris: Secondary | ICD-10-CM | POA: Diagnosis not present

## 2022-01-03 DIAGNOSIS — N183 Chronic kidney disease, stage 3 unspecified: Secondary | ICD-10-CM | POA: Diagnosis not present

## 2022-01-03 DIAGNOSIS — I129 Hypertensive chronic kidney disease with stage 1 through stage 4 chronic kidney disease, or unspecified chronic kidney disease: Secondary | ICD-10-CM | POA: Diagnosis not present

## 2022-01-03 DIAGNOSIS — R82998 Other abnormal findings in urine: Secondary | ICD-10-CM | POA: Diagnosis not present

## 2022-01-03 DIAGNOSIS — M1990 Unspecified osteoarthritis, unspecified site: Secondary | ICD-10-CM | POA: Diagnosis not present

## 2022-01-03 DIAGNOSIS — N401 Enlarged prostate with lower urinary tract symptoms: Secondary | ICD-10-CM | POA: Diagnosis not present

## 2022-01-03 DIAGNOSIS — E039 Hypothyroidism, unspecified: Secondary | ICD-10-CM | POA: Diagnosis not present

## 2022-01-10 ENCOUNTER — Encounter: Payer: Self-pay | Admitting: Cardiovascular Disease

## 2022-01-10 ENCOUNTER — Other Ambulatory Visit: Payer: Self-pay

## 2022-01-10 ENCOUNTER — Ambulatory Visit: Payer: Medicare HMO | Admitting: Cardiovascular Disease

## 2022-01-10 VITALS — BP 118/60 | HR 55 | Ht 70.0 in | Wt 180.8 lb

## 2022-01-10 DIAGNOSIS — I340 Nonrheumatic mitral (valve) insufficiency: Secondary | ICD-10-CM | POA: Diagnosis not present

## 2022-01-10 DIAGNOSIS — I10 Essential (primary) hypertension: Secondary | ICD-10-CM

## 2022-01-10 DIAGNOSIS — I2510 Atherosclerotic heart disease of native coronary artery without angina pectoris: Secondary | ICD-10-CM | POA: Diagnosis not present

## 2022-01-10 NOTE — Progress Notes (Signed)
Chief Complaint  Patient presents with   Follow-up    CAD   History of Present Illness: 81 yo male with history of CAD s/p 3V CABG 2005, HLD, mitral regurgitation, aortic regurgitation, GERD and ED here today cardiac follow up. His CABG was in September 2005. Echo 12/20/16 with normal LV systolic function, mild AI, moderate MR. Nuclear stress test January 2018 with possible inferior wall ischemia, EKG changes with exercise. This was arranged due to dyspnea. Cardiac cath February 2018 with three vessel CAD with 3/3 patent bypass grafts. Echo November 2022 with LVEF=55-60%. Mild RV dysfunction. Mitral valve prolapse with mild to moderate MR. Trivial aortic valve insufficiency.   He is here today for follow up. The patient denies any chest pain, dyspnea, palpitations, lower extremity edema, orthopnea, PND, dizziness, near syncope or syncope.     Primary Care Physician: Prince Solian, MD  Past Medical History:  Diagnosis Date   Allergic rhinitis    Aortic insufficiency    Arthritis    oa   Benign prostatic hypertrophy    Chronic kidney disease, stage 3 ([facility])    Coronary artery disease    a. s/p 3V CABG 2005.   Erectile dysfunction    GERD (gastroesophageal reflux disease)    Gout    History of kidney stones    Hyperlipemia    Hypertension    Hypothyroidism    Mitral regurgitation    Mitral valve prolapse    Plantar fasciitis    Tubular adenoma of colon     Past Surgical History:  Procedure Laterality Date   CARDIAC CATHETERIZATION N/A 01/10/2017   Procedure: Left Heart Cath and Cors/Grafts Angiography;  Surgeon: Burnell Blanks, MD;  Location: [facility];  Service: Cardiovascular;  Laterality: N/A;   CORONARY ARTERY BYPASS GRAFT  LIMA to LAD, SVG to Intermediate, SVG to PDA   2005 cabg x 3   Hernia surgery x 2     TOTAL HIP ARTHROPLASTY Left 01/27/2013   Procedure: TOTAL HIP ARTHROPLASTY ANTERIOR APPROACH;  Surgeon: Mauri Pole, MD;  Location: [facility];   Service: Orthopedics;  Laterality: Left;   TOTAL KNEE ARTHROPLASTY Left 02/21/2021   Procedure: TOTAL KNEE ARTHROPLASTY;  Surgeon: Paralee Cancel, MD;  Location: [facility];  Service: Orthopedics;  Laterality: Left;  70 mins    Current Outpatient Medications  Medication Sig Dispense Refill   allopurinol (ZYLOPRIM) 300 MG tablet Take 300 mg by mouth daily.     aspirin EC 81 MG tablet Take 81 mg by mouth daily.     celecoxib (CELEBREX) 200 MG capsule Take 1 capsule (200 mg total) by mouth 2 (two) times daily. 60 capsule 0   Cholecalciferol (VITAMIN D3) 1000 units CAPS Take 1,000 Units by mouth daily.     docusate sodium (COLACE) 100 MG capsule Take 1 capsule (100 mg total) by mouth 2 (two) times daily. 28 capsule 0   ezetimibe (ZETIA) 10 MG tablet Take 10 mg by mouth every morning.     HYDROcodone-acetaminophen (NORCO) 7.5-325 MG tablet Take 1-2 tablets by mouth every 4 (four) hours as needed for moderate pain. 60 tablet 0   levothyroxine (SYNTHROID) 75 MCG tablet Take 75 mcg by mouth daily before breakfast.     metoprolol (LOPRESSOR) 50 MG tablet Take 25 mg by mouth 2 (two) times daily.      nitroGLYCERIN (NITROSTAT) 0.4 MG SL tablet Place 0.4 mg under the tongue every 5 (five) minutes as needed for chest pain.  Omega-3 Fatty Acids (FISH OIL) 1200 MG CAPS Take 1,200 mg by mouth daily.     omeprazole (PRILOSEC) 20 MG capsule Take 20 mg by mouth every Monday, Wednesday, and Friday.     rosuvastatin (CRESTOR) 10 MG tablet Take 10 mg by mouth every morning.     tamsulosin (FLOMAX) 0.4 MG CAPS capsule Take 0.4 mg by mouth daily.     diphenhydrAMINE (BENADRYL) 25 MG tablet Take 25 mg by mouth daily as needed for allergies. (Patient not taking: Reported on 01/10/2022)     ferrous sulfate (FERROUSUL) 325 (65 FE) MG tablet Take 1 tablet (325 mg total) by mouth 3 (three) times daily with meals for 14 days. 42 tablet 0   methocarbamol (ROBAXIN) 500 MG tablet Take 1 tablet (500 mg total) by mouth every 6 (six)  hours as needed for muscle spasms. (Patient not taking: Reported on 01/10/2022) 40 tablet 0   polyethylene glycol (MIRALAX / GLYCOLAX) 17 g packet Take 17 g by mouth 2 (two) times daily. (Patient not taking: Reported on 01/10/2022) 28 packet 0   sildenafil (VIAGRA) 100 MG tablet Take 100 mg by mouth as needed for erectile dysfunction. 1 hr prior to sexual activity (Patient not taking: Reported on 01/10/2022)     No current facility-administered medications for this visit.    No Known Allergies  Social History   Socioeconomic History   Marital status: Married    Spouse name: Not on file   Number of children: Not on file   Years of education: Not on file   Highest education level: Not on file  Occupational History   Occupation: Retired-banker  Tobacco Use   Smoking status: Never   Smokeless tobacco: Never  Vaping Use   Vaping Use: Never used  Substance and Sexual Activity   Alcohol use: Yes    Comment: social   Drug use: No   Sexual activity: Not on file  Other Topics Concern   Not on file  Social History Narrative    Positive for coronary disease in both his father as     well as a stroke in his mother.         Social Determinants of Health   Financial Resource Strain: Not on file  Food Insecurity: Not on file  Transportation Needs: Not on file  Physical Activity: Not on file  Stress: Not on file  Social Connections: Not on file  Intimate Partner Violence: Not on file    Family History  Problem Relation Age of Onset   Heart attack Father    Coronary artery disease Other     Review of Systems:  As stated in the HPI and otherwise negative.   BP 118/60    Pulse (!) 55    Ht 5\' 10"  (1.778 m)    Wt 180 lb 12.8 oz (82 kg)    SpO2 96%    BMI 25.94 kg/m   Physical Examination: General: Well developed, well nourished, NAD  HEENT: OP clear, mucus membranes moist  SKIN: warm, dry. No rashes. Neuro: No focal deficits  Musculoskeletal: Muscle strength 5/5 all ext   Psychiatric: Mood and affect normal  Neck: No JVD, no carotid bruits, no thyromegaly, no lymphadenopathy.  Lungs:Clear bilaterally, no wheezes, rhonci, crackles Cardiovascular: Regular rate and rhythm. Soft systolic murmur.  Abdomen:Soft. Bowel sounds present. Non-tender.  Extremities: No lower extremity edema. Pulses are 2 + in the bilateral DP/PT.  Echo November 2022:  1. Left ventricular ejection fraction, by visual estimation,  is 55 to  60%. The left ventricle has normal function. There is mildly increased  left ventricular hypertrophy.   2. Left ventricular diastolic parameters are consistent with Grade I  diastolic dysfunction (impaired relaxation).   3. Global right ventricle has mildly reduced systolic function.The right  ventricular size is normal. No increase in right ventricular wall  thickness.   4. Left atrial size was moderately dilated.   5. Right atrial size was normal.   6. Moderate mitral valve prolapse.   7. The mitral valve is abnormal. Mild to moderate mitral valve  regurgitation.   8. Posterior mitral valve leaflet prolapse.   9. The tricuspid valve is grossly normal. Tricuspid valve regurgitation  is mild.  10. The aortic valve is tricuspid. Aortic valve regurgitation is trivial.  Mild aortic valve sclerosis without stenosis.  11. The pulmonic valve was grossly normal. Pulmonic valve regurgitation is  not visualized.  12. Normal pulmonary artery systolic pressure.  13. The inferior vena cava is normal in size with greater than 50%  respiratory variability, suggesting right atrial pressure of 3 mmHg.   Cardiac cath February 2018:  Left Main  Ost LM lesion, 70% stenosed.  Left Anterior Descending  Vessel is large.  Mid LAD lesion, 50% stenosed.  First Septal Branch  Vessel is small in size.  Second Diagonal Branch  Vessel is moderate in size.  Second Septal Branch  Vessel is small in size.  Ramus Intermedius  Vessel is large.  Ost Ramus to Ramus  lesion, 100% stenosed.  Left Circumflex  Vessel is moderate in size.  First Obtuse Marginal Branch  Vessel is small in size.  Second Obtuse Marginal Branch  Vessel is small in size.  Third Obtuse Marginal Branch  Vessel is small in size.  Right Coronary Artery  Mid RCA lesion, 70% stenosed.  Graft Angiography  saphenous Graft to RPDA  SVG graft was visualized by angiography and is normal in caliber and anatomically normal.  saphenous Graft to Ramus  SVG due to known occlusion and is normal in caliber and anatomically normal.  Free LIMA Graft to Dist LAD  LIMA graft was visualized by angiography and is normal in caliber and anatomically normal.  Coronary Diagrams   Diagnostic Diagram          EKG:  EKG is ordered today.  The ekg ordered today demonstrates Sinus brady, rate 55 bpm  Recent Labs: 02/22/2021: BUN 17; Creatinine, Ser 0.89; Hemoglobin 13.6; Platelets 164; Potassium 4.5; Sodium 139   Lipid Panel No results found for: CHOL, TRIG, HDL, CHOLHDL, VLDL, LDLCALC, LDLDIRECT   Wt Readings from Last 3 Encounters:  01/10/22 180 lb 12.8 oz (82 kg)  07/20/21 176 lb (79.8 kg)  06/15/21 176 lb (79.8 kg)     Other studies Reviewed: Additional studies/ records that were reviewed today include: . Review of the above records demonstrates:    Assessment and Plan:   1. CAD s/p CABG without angina: No chest pain. Cardiac cath February 2018 with three vessel CAD with 3/3 patent bypass grafts. Continue statin and beta blocker. Restart ASA 81 mg daily.   2. Hyperlipidemia: Lipids followed in primary care. LDL 67 in January . Continue statin.   3. Mitral regurgitation: Echo November 2022 with mild to moderate MR. Repeat echo November 2024  Current medicines are reviewed at length with the patient today.  The patient does not have concerns regarding medicines.  The following changes have been made:  no change  Labs/ tests  ordered today include:   Orders Placed This  Encounter  Procedures   EKG 12-Lead    Disposition:   FU with me in 12 months   Signed, Lauree Chandler, MD 01/10/2022 11:15 AM    [facility] [place], [place], [place]  [address] Phone: [phone]; Fax: [phone]

## 2022-01-10 NOTE — Patient Instructions (Signed)
Medication Instructions:  Start Aspirin 81 mg daily   *If you need a refill on your cardiac medications before your next appointment, please call your pharmacy*   Lab Work: None ordered   If you have labs (blood work) drawn today and your tests are completely normal, you will receive your results only by: [place] (if you have MyChart) OR A paper copy in the mail If you have any lab test that is abnormal or we need to change your treatment, we will call you to review the results.   Testing/Procedures: None ordered    Follow-Up: At [facility], you and your health needs are our priority.  As part of our continuing mission to provide you with exceptional heart care, we have created designated Provider Care Teams.  These Care Teams include your primary Cardiologist (physician) and Advanced Practice Providers (APPs -  Physician Assistants and Nurse Practitioners) who all work together to provide you with the care you need, when you need it.  We recommend signing up for the patient portal called "MyChart".  Sign up information is provided on this After Visit Summary.  MyChart is used to connect with patients for Virtual Visits (Telemedicine).  Patients are able to view lab/test results, encounter notes, upcoming appointments, etc.  Non-urgent messages can be sent to your provider as well.   To learn more about what you can do with MyChart, go to [email or web address].    Your next appointment:   12 month(s)  The format for your next appointment:   In Person  Provider:   Lauree Chandler, MD     Other Instructions None

## 2022-03-07 DIAGNOSIS — M1711 Unilateral primary osteoarthritis, right knee: Secondary | ICD-10-CM | POA: Diagnosis not present

## 2022-03-07 DIAGNOSIS — M25551 Pain in right hip: Secondary | ICD-10-CM | POA: Diagnosis not present

## 2022-03-07 DIAGNOSIS — M25561 Pain in right knee: Secondary | ICD-10-CM | POA: Diagnosis not present

## 2022-03-07 DIAGNOSIS — M1611 Unilateral primary osteoarthritis, right hip: Secondary | ICD-10-CM | POA: Diagnosis not present

## 2022-03-28 DIAGNOSIS — M25551 Pain in right hip: Secondary | ICD-10-CM | POA: Diagnosis not present

## 2022-04-10 DIAGNOSIS — M109 Gout, unspecified: Secondary | ICD-10-CM | POA: Diagnosis not present

## 2022-04-10 DIAGNOSIS — E785 Hyperlipidemia, unspecified: Secondary | ICD-10-CM | POA: Diagnosis not present

## 2022-04-10 DIAGNOSIS — M25551 Pain in right hip: Secondary | ICD-10-CM | POA: Diagnosis not present

## 2022-04-10 DIAGNOSIS — E039 Hypothyroidism, unspecified: Secondary | ICD-10-CM | POA: Diagnosis not present

## 2022-04-10 DIAGNOSIS — M1990 Unspecified osteoarthritis, unspecified site: Secondary | ICD-10-CM | POA: Diagnosis not present

## 2022-04-10 DIAGNOSIS — I129 Hypertensive chronic kidney disease with stage 1 through stage 4 chronic kidney disease, or unspecified chronic kidney disease: Secondary | ICD-10-CM | POA: Diagnosis not present

## 2022-04-10 DIAGNOSIS — I059 Rheumatic mitral valve disease, unspecified: Secondary | ICD-10-CM | POA: Diagnosis not present

## 2022-04-10 DIAGNOSIS — N183 Chronic kidney disease, stage 3 unspecified: Secondary | ICD-10-CM | POA: Diagnosis not present

## 2022-04-10 DIAGNOSIS — I2510 Atherosclerotic heart disease of native coronary artery without angina pectoris: Secondary | ICD-10-CM | POA: Diagnosis not present

## 2022-04-10 DIAGNOSIS — N200 Calculus of kidney: Secondary | ICD-10-CM | POA: Diagnosis not present

## 2022-04-11 DIAGNOSIS — M1611 Unilateral primary osteoarthritis, right hip: Secondary | ICD-10-CM | POA: Diagnosis not present

## 2022-04-13 DIAGNOSIS — M25551 Pain in right hip: Secondary | ICD-10-CM | POA: Diagnosis not present

## 2022-04-17 DIAGNOSIS — N210 Calculus in bladder: Secondary | ICD-10-CM | POA: Diagnosis not present

## 2022-04-17 DIAGNOSIS — N400 Enlarged prostate without lower urinary tract symptoms: Secondary | ICD-10-CM | POA: Diagnosis not present

## 2022-04-17 DIAGNOSIS — R310 Gross hematuria: Secondary | ICD-10-CM | POA: Diagnosis not present

## 2022-04-17 DIAGNOSIS — N200 Calculus of kidney: Secondary | ICD-10-CM | POA: Diagnosis not present

## 2022-04-17 DIAGNOSIS — R350 Frequency of micturition: Secondary | ICD-10-CM | POA: Diagnosis not present

## 2022-04-17 DIAGNOSIS — N401 Enlarged prostate with lower urinary tract symptoms: Secondary | ICD-10-CM | POA: Diagnosis not present

## 2022-04-18 ENCOUNTER — Encounter (HOSPITAL_COMMUNITY): Payer: Self-pay | Admitting: Orthopedic Surgery

## 2022-04-18 ENCOUNTER — Other Ambulatory Visit: Payer: Self-pay

## 2022-04-18 NOTE — Progress Notes (Addendum)
PCP - Prince Solian , MD ?Cardiologist - Lauree Chandler, MD LOV 01-10-22 epic ? ?PPM/ICD -  ?Device Orders -  ?Rep Notified -  ? ?Chest x-ray -  ?EKG - 01-10-22 epic ?Stress Test -  ?ECHO - 10-18-21 epic ?Cardiac Cath -  ? ?Sleep Study -  ?CPAP -  ? ?Fasting Blood Sugar -  ?Checks Blood Sugar _____ times a day ? ?Blood Thinner Instructions: ?Aspirin Instructions: 81 mg asa stopped 04-15-22 ? ?ERAS Protcol - ?PRE-SURGERY Ensure -  ? ? ?COVID vaccine - ? ?Activity--Works in yard without SOB. Or CP ? ?Anesthesia review: CKDIII, HTN,CABG 2005, mitral reg, MVP, CAD ? ?Patient denies shortness of breath, fever, cough and chest pain at PAT appointment ? ? ?All instructions explained to the patient, with a verbal understanding of the material. Patient agrees to go over the instructions while at home for a better understanding. Patient also instructed to self quarantine after being tested for COVID-19. The opportunity to ask questions was provided. ?  ?

## 2022-04-18 NOTE — Progress Notes (Signed)
Orders requested via in basket ?

## 2022-04-19 NOTE — Progress Notes (Signed)
Anesthesia Chart Review ? ? Case: [id number] Date/Time: 04/24/22 1530  ? Procedure: TOTAL HIP ARTHROPLASTY ANTERIOR APPROACH (Right: Hip)  ? Anesthesia type: Spinal  ? Pre-op diagnosis: Right femoral neck fracture, osteoarthritis  ? [facility]: [facility] 09 / [facility]  ? Surgeons: Paralee Cancel, MD  ? ?  ? ? ?DISCUSSION:81 y.o. never smoker with h/o GERD, HTN, CAD s/p 3V CABG 2005, mild to moderate MR, CKD Stage III, right femoral neck fracture scheduled for above procedure 04/24/2022 with Dr. Paralee Cancel.  ? ?Pt last seen by cardiology 01/10/22. Stable at this visit.  ? ?Labs DOS, same day workup.  ?VS: There were no vitals taken for this visit. ? ?PROVIDERS: ?Prince Solian, MD is PCP  ? ?Burnell Blanks, MD is Cardiologist  ?LABS:  labs DOS, same day workup ?(all labs ordered are listed, but only abnormal results are displayed) ? ?Labs Reviewed - No data to display ? ? ?IMAGES: ? ? ?EKG: ?01/10/22 ?Rate 55 bpm  ?Sinus bradycardia  ? ?CV: ?Echo 10/18/2021 ?1. Left ventricular ejection fraction, by estimation, is 60 to 65%. The  ?left ventricle has normal function. The left ventricle has no regional  ?wall motion abnormalities. Left ventricular diastolic parameters are  ?consistent with Grade I diastolic  ?dysfunction (impaired relaxation).  ? 2. Right ventricular systolic function is normal. The right ventricular  ?size is normal. There is normal pulmonary artery systolic pressure.  ? 3. The mitral valve is degenerative. Moderate mitral valve regurgitation.  ?No evidence of mitral stenosis. There is moderate holosystolic prolapse of  ?the middle scallop of the posterior leaflet of the mitral valve.  ? 4. The aortic valve is normal in structure. There is mild calcification  ?of the aortic valve. There is mild thickening of the aortic valve. Aortic  ?valve regurgitation is moderate. Mild aortic valve sclerosis is present,  ?with no evidence of aortic valve  ?stenosis. Aortic regurgitation PHT measures 622 msec.  ? 5.  The inferior vena cava is normal in size with greater than 50%  ?respiratory variability, suggesting right atrial pressure of 3 mmHg. ? ?Cardiac Cath 01/10/2017 ?Mid RCA lesion, 70 %stenosed. ?SVG graft was visualized by angiography and is normal in caliber and anatomically normal. ?Ost Ramus to Ramus lesion, 100 %stenosed. ?SVG due to known occlusion and is normal in caliber and anatomically normal. ?Mid LAD lesion, 50 %stenosed. ?LIMA graft was visualized by angiography and is normal in caliber and anatomically normal. ?Ost LM lesion, 70 %stenosed. ?  ?1. Triple vessel CAD with 3/3 patent bypass grafts ?  ?  ?Recommendations: Continue medical management of CAD.  ? ?Myocardial Perfusion 12/20/16 ?Nuclear stress EF: 56%. ?Horizontal ST segment depression ST segment depression of 2 mm was noted during stress in the aVF, III, II, V5 and V6 leads. ?Blood pressure demonstrated a hypertensive response to exercise. ?Defect 1: There is a small defect of mild severity present in the basal inferior location. ?Findings consistent with ischemia. ?This is a low risk study. ?  ?Low risk stress nuclear study with mild inferior wall ischemia and normal left ventricular regional and global systolic function, but with abnormal ECG response. ?The area of ischemia is new from 2015, but is very mild and small. ?The ECG changes during exercise are quite striking, disproportionate to the small area of ischemia, and may have alternative explanations (e.g. LVH, "false positive" in a patient with previous CABG).  ?Depending on the clinical situation, further evaluation with invasive angiography may be justified. ?Past Medical History:  ?  Diagnosis Date  ? Allergic rhinitis   ? Aortic insufficiency   ? Arthritis   ? oa  ? Benign prostatic hypertrophy   ? Bladder stone   ? hematuria 04-17-22  saw urology bladder stone found will remove post hip surgery  ? Chronic kidney disease, stage 3 ([facility])   ? Coronary artery disease   ? a. s/p 3V CABG 2005.  ?  Erectile dysfunction   ? GERD (gastroesophageal reflux disease)   ? Gout   ? History of kidney stones   ? Hyperlipemia   ? Hypertension   ? Hypothyroidism   ? Mitral regurgitation   ? Mitral valve prolapse   ? Neuromuscular disorder ([place])   ? mild neuropathy  ? Plantar fasciitis   ? Tubular adenoma of colon   ? ? ?Past Surgical History:  ?Procedure Laterality Date  ? CARDIAC CATHETERIZATION N/A 01/10/2017  ? Procedure: Left Heart Cath and Cors/Grafts Angiography;  Surgeon: Burnell Blanks, MD;  Location: [facility];  Service: Cardiovascular;  Laterality: N/A;  ? CORONARY ARTERY BYPASS GRAFT  LIMA to LAD, SVG to Intermediate, SVG to PDA  ? 2005 cabg x 3  ? Hernia surgery x 2    ? TOTAL HIP ARTHROPLASTY Left 01/27/2013  ? Procedure: TOTAL HIP ARTHROPLASTY ANTERIOR APPROACH;  Surgeon: Mauri Pole, MD;  [facility];  Service: Orthopedics;  Laterality: Left;  ? TOTAL KNEE ARTHROPLASTY Left 02/21/2021  ? Procedure: TOTAL KNEE ARTHROPLASTY;  Surgeon: Paralee Cancel, MD;  [facility];  Service: Orthopedics;  Laterality: Left;  70 mins  ? ? ?MEDICATIONS: ?No current facility-administered medications for this encounter.  ? ? allopurinol (ZYLOPRIM) 300 MG tablet  ? aspirin EC 81 MG tablet  ? celecoxib (CELEBREX) 200 MG capsule  ? Cholecalciferol (VITAMIN D3) 1000 units CAPS  ? diphenhydrAMINE (BENADRYL) 25 MG tablet  ? docusate sodium (COLACE) 100 MG capsule  ? ezetimibe (ZETIA) 10 MG tablet  ? ferrous sulfate (FERROUSUL) 325 (65 FE) MG tablet  ? HYDROcodone-acetaminophen (NORCO) 7.5-325 MG tablet  ? levothyroxine (SYNTHROID) 75 MCG tablet  ? methocarbamol (ROBAXIN) 500 MG tablet  ? metoprolol (LOPRESSOR) 50 MG tablet  ? nitroGLYCERIN (NITROSTAT) 0.4 MG SL tablet  ? Omega-3 Fatty Acids (FISH OIL) 1200 MG CAPS  ? omeprazole (PRILOSEC) 20 MG capsule  ? polyethylene glycol (MIRALAX / GLYCOLAX) 17 g packet  ? rosuvastatin (CRESTOR) 10 MG tablet  ? sildenafil (VIAGRA) 100 MG tablet  ? tamsulosin (FLOMAX) 0.4  MG CAPS capsule  ? ? ? ?Konrad Felix Ward, PA-C ?[facility] ?[phone] ? ? ? ? ? ?

## 2022-04-24 ENCOUNTER — Encounter (HOSPITAL_COMMUNITY)
Admission: RE | Disposition: A | Discharge status: Home / Self Care | Payer: Self-pay | Source: Home / Self Care | Attending: Orthopedic Surgery

## 2022-04-24 ENCOUNTER — Other Ambulatory Visit: Payer: Self-pay

## 2022-04-24 ENCOUNTER — Ambulatory Visit (HOSPITAL_COMMUNITY): Payer: Medicare HMO

## 2022-04-24 ENCOUNTER — Encounter (HOSPITAL_COMMUNITY): Payer: Self-pay | Admitting: Orthopedic Surgery

## 2022-04-24 ENCOUNTER — Observation Stay (HOSPITAL_COMMUNITY)
Admission: RE | Admit: 2022-04-24 | Discharge: 2022-04-25 | Disposition: A | Discharge status: Home / Self Care | Payer: Medicare HMO | Attending: Orthopedic Surgery | Admitting: Orthopedic Surgery

## 2022-04-24 ENCOUNTER — Observation Stay (HOSPITAL_COMMUNITY): Payer: Medicare HMO

## 2022-04-24 ENCOUNTER — Ambulatory Visit (HOSPITAL_COMMUNITY): Payer: Medicare HMO | Admitting: Physician Assistant

## 2022-04-24 ENCOUNTER — Ambulatory Visit (HOSPITAL_BASED_OUTPATIENT_CLINIC_OR_DEPARTMENT_OTHER): Payer: Medicare HMO | Admitting: Physician Assistant

## 2022-04-24 DIAGNOSIS — Z471 Aftercare following joint replacement surgery: Secondary | ICD-10-CM | POA: Diagnosis not present

## 2022-04-24 DIAGNOSIS — E039 Hypothyroidism, unspecified: Secondary | ICD-10-CM | POA: Insufficient documentation

## 2022-04-24 DIAGNOSIS — Z96642 Presence of left artificial hip joint: Secondary | ICD-10-CM | POA: Diagnosis not present

## 2022-04-24 DIAGNOSIS — M25751 Osteophyte, right hip: Secondary | ICD-10-CM | POA: Diagnosis not present

## 2022-04-24 DIAGNOSIS — I129 Hypertensive chronic kidney disease with stage 1 through stage 4 chronic kidney disease, or unspecified chronic kidney disease: Secondary | ICD-10-CM | POA: Insufficient documentation

## 2022-04-24 DIAGNOSIS — M1611 Unilateral primary osteoarthritis, right hip: Principal | ICD-10-CM | POA: Insufficient documentation

## 2022-04-24 DIAGNOSIS — I10 Essential (primary) hypertension: Secondary | ICD-10-CM

## 2022-04-24 DIAGNOSIS — Z96652 Presence of left artificial knee joint: Secondary | ICD-10-CM | POA: Insufficient documentation

## 2022-04-24 DIAGNOSIS — N189 Chronic kidney disease, unspecified: Secondary | ICD-10-CM | POA: Diagnosis not present

## 2022-04-24 DIAGNOSIS — N183 Chronic kidney disease, stage 3 unspecified: Secondary | ICD-10-CM | POA: Insufficient documentation

## 2022-04-24 DIAGNOSIS — Z951 Presence of aortocoronary bypass graft: Secondary | ICD-10-CM | POA: Diagnosis not present

## 2022-04-24 DIAGNOSIS — I2510 Atherosclerotic heart disease of native coronary artery without angina pectoris: Secondary | ICD-10-CM | POA: Insufficient documentation

## 2022-04-24 DIAGNOSIS — S72001A Fracture of unspecified part of neck of right femur, initial encounter for closed fracture: Secondary | ICD-10-CM | POA: Diagnosis not present

## 2022-04-24 DIAGNOSIS — Z01818 Encounter for other preprocedural examination: Secondary | ICD-10-CM

## 2022-04-24 DIAGNOSIS — Z79899 Other long term (current) drug therapy: Secondary | ICD-10-CM | POA: Insufficient documentation

## 2022-04-24 DIAGNOSIS — Z96641 Presence of right artificial hip joint: Secondary | ICD-10-CM

## 2022-04-24 HISTORY — PX: TOTAL HIP ARTHROPLASTY: SHX124

## 2022-04-24 HISTORY — DX: Calculus in bladder: N21.0

## 2022-04-24 HISTORY — DX: Myoneural disorder, unspecified: G70.9

## 2022-04-24 LAB — CBC
HCT: 47.7 % (ref 39.0–52.0)
Hemoglobin: 16.4 g/dL (ref 13.0–17.0)
MCH: 31.3 pg (ref 26.0–34.0)
MCHC: 34.4 g/dL (ref 30.0–36.0)
MCV: 91 fL (ref 80.0–100.0)
Platelets: 478 10*3/uL — ABNORMAL HIGH (ref 150–400)
RBC: 5.24 MIL/uL (ref 4.22–5.81)
RDW: 12.9 % (ref 11.5–15.5)
WBC: 14.8 10*3/uL — ABNORMAL HIGH (ref 4.0–10.5)
nRBC: 0 % (ref 0.0–0.2)

## 2022-04-24 LAB — BASIC METABOLIC PANEL
Anion gap: 8 (ref 5–15)
BUN: 20 mg/dL (ref 8–23)
CO2: 26 mmol/L (ref 22–32)
Calcium: 9.9 mg/dL (ref 8.9–10.3)
Chloride: 99 mmol/L (ref 98–111)
Creatinine, Ser: 1.05 mg/dL (ref 0.61–1.24)
GFR, Estimated: >60 mL/min (ref >60)
Glucose, Bld: 104 mg/dL — ABNORMAL HIGH (ref 70–99)
Potassium: 4.3 mmol/L (ref 3.5–5.1)
Sodium: 133 mmol/L — ABNORMAL LOW (ref 135–145)

## 2022-04-24 LAB — TYPE AND SCREEN
ABO/RH(D): O POS
Antibody Screen: NEGATIVE

## 2022-04-24 LAB — SURGICAL PCR SCREEN
MRSA, PCR: NEGATIVE
Staphylococcus aureus: POSITIVE — AB

## 2022-04-24 SURGERY — ARTHROPLASTY, HIP, TOTAL, ANTERIOR APPROACH
Anesthesia: Spinal | Site: Hip | Laterality: Right

## 2022-04-24 MED ORDER — DIPHENHYDRAMINE HCL 12.5 MG/5ML PO ELIX: 12.5000 mg | ORAL_SOLUTION | ORAL | Status: DC | PRN

## 2022-04-24 MED ORDER — TRANEXAMIC ACID-NACL 1000-0.7 MG/100ML-% IV SOLN
1000.0000 mg | INTRAVENOUS | Status: AC
  Administered 2022-04-24: 1000 mg via INTRAVENOUS

## 2022-04-24 MED ORDER — DOCUSATE SODIUM 100 MG PO CAPS
100.0000 mg | ORAL_CAPSULE | Freq: Two times a day (BID) | ORAL | Status: DC
  Administered 2022-04-24 – 2022-04-25 (×2): 100 mg via ORAL
  Filled 2022-04-24 (×2): qty 1

## 2022-04-24 MED ORDER — PROPOFOL 500 MG/50ML IV EMUL
INTRAVENOUS | Status: DC | PRN
Start: 2022-04-24 — End: 2022-04-24
  Administered 2022-04-24: 50 ug/kg/min via INTRAVENOUS

## 2022-04-24 MED ORDER — ACETAMINOPHEN 325 MG PO TABS: 325.0000 mg | ORAL_TABLET | Freq: Four times a day (QID) | ORAL | Status: DC | PRN

## 2022-04-24 MED ORDER — POLYETHYLENE GLYCOL 3350 17 G PO PACK: 17.0000 g | PACK | Freq: Every day | ORAL | Status: DC | PRN

## 2022-04-24 MED ORDER — MENTHOL 3 MG MT LOZG: 1.0000 | LOZENGE | OROMUCOSAL | Status: DC | PRN

## 2022-04-24 MED ORDER — DEXAMETHASONE SODIUM PHOSPHATE 10 MG/ML IJ SOLN
10.0000 mg | Freq: Once | INTRAMUSCULAR | Status: AC
  Administered 2022-04-25: 10 mg via INTRAVENOUS
  Filled 2022-04-24: qty 1

## 2022-04-24 MED ORDER — AMISULPRIDE (ANTIEMETIC) 5 MG/2ML IV SOLN: 10.0000 mg | Freq: Once | INTRAVENOUS | Status: DC | PRN

## 2022-04-24 MED ORDER — FINASTERIDE 5 MG PO TABS
5.0000 mg | ORAL_TABLET | Freq: Every day | ORAL | Status: DC
  Administered 2022-04-25: 5 mg via ORAL
  Filled 2022-04-24: qty 1

## 2022-04-24 MED ORDER — TAMSULOSIN HCL 0.4 MG PO CAPS
0.4000 mg | ORAL_CAPSULE | Freq: Every day | ORAL | Status: DC
  Administered 2022-04-24 – 2022-04-25 (×2): 0.4 mg via ORAL
  Filled 2022-04-24 (×2): qty 1

## 2022-04-24 MED ORDER — LEVOTHYROXINE SODIUM 75 MCG PO TABS
75.0000 ug | ORAL_TABLET | Freq: Every day | ORAL | Status: DC
  Administered 2022-04-25: 75 ug via ORAL
  Filled 2022-04-24: qty 1

## 2022-04-24 MED ORDER — ONDANSETRON HCL 4 MG/2ML IJ SOLN: 4.0000 mg | Freq: Once | INTRAMUSCULAR | Status: DC | PRN

## 2022-04-24 MED ORDER — PROPOFOL 10 MG/ML IV BOLUS
INTRAVENOUS | Status: DC | PRN
  Administered 2022-04-24: 10 mg via INTRAVENOUS
  Administered 2022-04-24 (×3): 20 mg via INTRAVENOUS
  Administered 2022-04-24: 10 mg via INTRAVENOUS

## 2022-04-24 MED ORDER — ORAL CARE MOUTH RINSE: 15.0000 mL | Freq: Once | OROMUCOSAL | Status: AC

## 2022-04-24 MED ORDER — ONDANSETRON HCL 4 MG PO TABS: 4.0000 mg | ORAL_TABLET | Freq: Four times a day (QID) | ORAL | Status: DC | PRN

## 2022-04-24 MED ORDER — PHENOL 1.4 % MT LIQD: 1.0000 | OROMUCOSAL | Status: DC | PRN

## 2022-04-24 MED ORDER — SODIUM CHLORIDE 0.9 % IV SOLN: INTRAVENOUS | Status: DC

## 2022-04-24 MED ORDER — METOCLOPRAMIDE HCL 5 MG PO TABS: 5.0000 mg | ORAL_TABLET | Freq: Three times a day (TID) | ORAL | Status: DC | PRN

## 2022-04-24 MED ORDER — BUPIVACAINE IN DEXTROSE 0.75-8.25 % IT SOLN
INTRATHECAL | Status: DC | PRN
  Administered 2022-04-24: 1.8 mL via INTRATHECAL

## 2022-04-24 MED ORDER — DEXAMETHASONE SODIUM PHOSPHATE 10 MG/ML IJ SOLN: 8.0000 mg | Freq: Once | INTRAMUSCULAR | Status: DC

## 2022-04-24 MED ORDER — TRANEXAMIC ACID-NACL 1000-0.7 MG/100ML-% IV SOLN
1000.0000 mg | Freq: Once | INTRAVENOUS | Status: AC
  Administered 2022-04-24: 1000 mg via INTRAVENOUS
  Filled 2022-04-24: qty 100

## 2022-04-24 MED ORDER — LACTATED RINGERS IV SOLN: INTRAVENOUS | Status: DC

## 2022-04-24 MED ORDER — CEFAZOLIN SODIUM-DEXTROSE 2-4 GM/100ML-% IV SOLN
2.0000 g | Freq: Four times a day (QID) | INTRAVENOUS | Status: AC
  Administered 2022-04-24 – 2022-04-25 (×2): 2 g via INTRAVENOUS
  Filled 2022-04-24 (×2): qty 100

## 2022-04-24 MED ORDER — CEFAZOLIN SODIUM-DEXTROSE 2-4 GM/100ML-% IV SOLN
2.0000 g | INTRAVENOUS | Status: AC
  Administered 2022-04-24: 2 g via INTRAVENOUS

## 2022-04-24 MED ORDER — CHLORHEXIDINE GLUCONATE CLOTH 2 % EX PADS: 6.0000 | MEDICATED_PAD | Freq: Every day | CUTANEOUS | Status: DC

## 2022-04-24 MED ORDER — LACTATED RINGERS IV SOLN
INTRAVENOUS | Status: DC
Start: 2022-04-24 — End: 2022-04-24

## 2022-04-24 MED ORDER — CEFAZOLIN SODIUM-DEXTROSE 2-4 GM/100ML-% IV SOLN
INTRAVENOUS | Status: AC
  Filled 2022-04-24: qty 100

## 2022-04-24 MED ORDER — ONDANSETRON HCL 4 MG/2ML IJ SOLN
INTRAMUSCULAR | Status: DC | PRN
  Administered 2022-04-24: 4 mg via INTRAVENOUS

## 2022-04-24 MED ORDER — METHOCARBAMOL 1000 MG/10ML IJ SOLN: 500.0000 mg | Freq: Four times a day (QID) | INTRAVENOUS | Status: DC | PRN

## 2022-04-24 MED ORDER — TRANEXAMIC ACID-NACL 1000-0.7 MG/100ML-% IV SOLN
INTRAVENOUS | Status: AC
  Filled 2022-04-24: qty 100

## 2022-04-24 MED ORDER — PHENYLEPHRINE 80 MCG/ML (10ML) SYRINGE FOR IV PUSH (FOR BLOOD PRESSURE SUPPORT)
PREFILLED_SYRINGE | INTRAVENOUS | Status: DC | PRN
  Administered 2022-04-24: 80 ug via INTRAVENOUS

## 2022-04-24 MED ORDER — SODIUM CHLORIDE 0.9 % IR SOLN
Status: DC | PRN
Start: 2022-04-24 — End: 2022-04-24
  Administered 2022-04-24: 1000 mL

## 2022-04-24 MED ORDER — METHOCARBAMOL 500 MG PO TABS
500.0000 mg | ORAL_TABLET | Freq: Four times a day (QID) | ORAL | Status: DC | PRN
  Administered 2022-04-24: 500 mg via ORAL
  Filled 2022-04-24: qty 1

## 2022-04-24 MED ORDER — HYDROCODONE-ACETAMINOPHEN 7.5-325 MG PO TABS: 1.0000 | ORAL_TABLET | ORAL | Status: DC | PRN

## 2022-04-24 MED ORDER — CHLORHEXIDINE GLUCONATE 0.12 % MT SOLN
15.0000 mL | Freq: Once | OROMUCOSAL | Status: AC
  Administered 2022-04-24: 15 mL via OROMUCOSAL

## 2022-04-24 MED ORDER — BISACODYL 10 MG RE SUPP: 10.0000 mg | Freq: Every day | RECTAL | Status: DC | PRN

## 2022-04-24 MED ORDER — PANTOPRAZOLE SODIUM 40 MG PO TBEC
40.0000 mg | DELAYED_RELEASE_TABLET | Freq: Every day | ORAL | Status: DC
Start: 2022-04-24 — End: 2022-04-25
  Administered 2022-04-24 – 2022-04-25 (×2): 40 mg via ORAL
  Filled 2022-04-24 (×2): qty 1

## 2022-04-24 MED ORDER — ONDANSETRON HCL 4 MG/2ML IJ SOLN: 4.0000 mg | Freq: Four times a day (QID) | INTRAMUSCULAR | Status: DC | PRN

## 2022-04-24 MED ORDER — MUPIROCIN 2 % EX OINT
1.0000 "application " | TOPICAL_OINTMENT | Freq: Two times a day (BID) | CUTANEOUS | Status: DC
  Administered 2022-04-24 – 2022-04-25 (×2): 1 via NASAL
  Filled 2022-04-24: qty 22

## 2022-04-24 MED ORDER — ALLOPURINOL 300 MG PO TABS
300.0000 mg | ORAL_TABLET | Freq: Every day | ORAL | Status: DC
  Administered 2022-04-25: 300 mg via ORAL
  Filled 2022-04-24: qty 1

## 2022-04-24 MED ORDER — NITROGLYCERIN 0.4 MG SL SUBL: 0.4000 mg | SUBLINGUAL_TABLET | SUBLINGUAL | Status: DC | PRN

## 2022-04-24 MED ORDER — HYDROCODONE-ACETAMINOPHEN 5-325 MG PO TABS
1.0000 | ORAL_TABLET | ORAL | Status: DC | PRN
  Administered 2022-04-24 (×2): 1 via ORAL
  Administered 2022-04-25 (×3): 2 via ORAL
  Filled 2022-04-24: qty 1
  Filled 2022-04-24: qty 2
  Filled 2022-04-24 (×2): qty 1
  Filled 2022-04-24 (×2): qty 2

## 2022-04-24 MED ORDER — METOCLOPRAMIDE HCL 5 MG/ML IJ SOLN: 5.0000 mg | Freq: Three times a day (TID) | INTRAMUSCULAR | Status: DC | PRN

## 2022-04-24 MED ORDER — POVIDONE-IODINE 10 % EX SWAB: 2.0000 "application " | Freq: Once | CUTANEOUS | Status: DC

## 2022-04-24 MED ORDER — FENTANYL CITRATE PF 50 MCG/ML IJ SOSY: 25.0000 ug | PREFILLED_SYRINGE | INTRAMUSCULAR | Status: DC | PRN

## 2022-04-24 MED ORDER — ASPIRIN 81 MG PO CHEW
81.0000 mg | CHEWABLE_TABLET | Freq: Two times a day (BID) | ORAL | Status: DC
  Administered 2022-04-24 – 2022-04-25 (×2): 81 mg via ORAL
  Filled 2022-04-24 (×2): qty 1

## 2022-04-24 MED ORDER — METOPROLOL TARTRATE 25 MG PO TABS
25.0000 mg | ORAL_TABLET | Freq: Two times a day (BID) | ORAL | Status: DC
  Administered 2022-04-24 – 2022-04-25 (×2): 25 mg via ORAL
  Filled 2022-04-24 (×2): qty 1

## 2022-04-24 MED ORDER — EZETIMIBE 10 MG PO TABS
10.0000 mg | ORAL_TABLET | Freq: Every morning | ORAL | Status: DC
  Administered 2022-04-25: 10 mg via ORAL
  Filled 2022-04-24: qty 1

## 2022-04-24 MED ORDER — PHENYLEPHRINE HCL-NACL 20-0.9 MG/250ML-% IV SOLN
INTRAVENOUS | Status: DC | PRN
Start: 2022-04-24 — End: 2022-04-24
  Administered 2022-04-24: 40 ug/min via INTRAVENOUS

## 2022-04-24 MED ORDER — FERROUS SULFATE 325 (65 FE) MG PO TABS
325.0000 mg | ORAL_TABLET | Freq: Three times a day (TID) | ORAL | Status: DC
  Administered 2022-04-25 (×2): 325 mg via ORAL
  Filled 2022-04-24 (×2): qty 1

## 2022-04-24 MED ORDER — ROSUVASTATIN CALCIUM 10 MG PO TABS
10.0000 mg | ORAL_TABLET | Freq: Every morning | ORAL | Status: DC
  Administered 2022-04-25: 10 mg via ORAL
  Filled 2022-04-24: qty 1

## 2022-04-24 MED ORDER — OXYCODONE HCL 5 MG PO TABS: 5.0000 mg | ORAL_TABLET | Freq: Once | ORAL | Status: DC | PRN

## 2022-04-24 MED ORDER — LIDOCAINE HCL (CARDIAC) PF 100 MG/5ML IV SOSY
PREFILLED_SYRINGE | INTRAVENOUS | Status: DC | PRN
  Administered 2022-04-24: 60 mg via INTRAVENOUS

## 2022-04-24 MED ORDER — ACETAMINOPHEN 500 MG PO TABS
1000.0000 mg | ORAL_TABLET | Freq: Once | ORAL | Status: AC
  Administered 2022-04-24: 1000 mg via ORAL
  Filled 2022-04-24: qty 2

## 2022-04-24 MED ORDER — OXYCODONE HCL 5 MG/5ML PO SOLN: 5.0000 mg | Freq: Once | ORAL | Status: DC | PRN

## 2022-04-24 MED ORDER — MORPHINE SULFATE (PF) 2 MG/ML IV SOLN
0.5000 mg | INTRAVENOUS | Status: DC | PRN
  Administered 2022-04-25: 1 mg via INTRAVENOUS
  Filled 2022-04-24: qty 1

## 2022-04-24 MED ORDER — LACTATED RINGERS IV BOLUS
500.0000 mL | Freq: Once | INTRAVENOUS | Status: AC
  Administered 2022-04-24: 500 mL via INTRAVENOUS

## 2022-04-24 SURGICAL SUPPLY — 48 items
ADH SKN CLS APL DERMABOND .7 (GAUZE/BANDAGES/DRESSINGS) ×1
ARTICULEZE HEAD (Hips) ×2 IMPLANT
BAG COUNTER SPONGE SURGICOUNT (BAG) ×1 IMPLANT
BAG DECANTER FOR FLEXI CONT (MISCELLANEOUS) IMPLANT
BAG SPEC THK2 15X12 ZIP CLS (MISCELLANEOUS) ×1
BAG SPNG CNTER NS LX DISP (BAG) ×1
BAG ZIPLOCK 12X15 (MISCELLANEOUS) ×1 IMPLANT
BLADE SAG 18X100X1.27 (BLADE) ×2 IMPLANT
COVER PERINEAL POST (MISCELLANEOUS) ×2 IMPLANT
COVER SURGICAL LIGHT HANDLE (MISCELLANEOUS) ×2 IMPLANT
CUP ACET PINNACLE SECTR 58MM (Hips) IMPLANT
DERMABOND ADVANCED (GAUZE/BANDAGES/DRESSINGS) ×1
DERMABOND ADVANCED .7 DNX12 (GAUZE/BANDAGES/DRESSINGS) ×1 IMPLANT
DRAPE FOOT SWITCH (DRAPES) ×2 IMPLANT
DRAPE STERI IOBAN 125X83 (DRAPES) ×2 IMPLANT
DRAPE U-SHAPE 47X51 STRL (DRAPES) ×4 IMPLANT
DRESSING AQUACEL AG SP 3.5X10 (GAUZE/BANDAGES/DRESSINGS) ×1 IMPLANT
DRSG AQUACEL AG SP 3.5X10 (GAUZE/BANDAGES/DRESSINGS) ×2
DURAPREP 26ML APPLICATOR (WOUND CARE) ×2 IMPLANT
ELECT BLADE TIP CTD 4 INCH (ELECTRODE) ×1 IMPLANT
ELECT REM PT RETURN 15FT ADLT (MISCELLANEOUS) ×2 IMPLANT
ELIMINATOR HOLE APEX DEPUY (Hips) ×1 IMPLANT
GLOVE BIO SURGEON STRL SZ 6 (GLOVE) ×2 IMPLANT
GLOVE BIOGEL PI IND STRL 6.5 (GLOVE) ×1 IMPLANT
GLOVE BIOGEL PI IND STRL 7.5 (GLOVE) ×1 IMPLANT
GLOVE BIOGEL PI INDICATOR 6.5 (GLOVE) ×1
GLOVE BIOGEL PI INDICATOR 7.5 (GLOVE) ×1
GLOVE ORTHO TXT STRL SZ7.5 (GLOVE) ×4 IMPLANT
GOWN STRL REUS W/ TWL LRG LVL3 (GOWN DISPOSABLE) ×2 IMPLANT
GOWN STRL REUS W/TWL LRG LVL3 (GOWN DISPOSABLE) ×4
HEAD ARTICULEZE (Hips) IMPLANT
HOLDER FOLEY CATH W/STRAP (MISCELLANEOUS) ×2 IMPLANT
KIT TURNOVER KIT A (KITS) IMPLANT
LINER NEUTRAL 36X58 PLUS4 ×1 IMPLANT
PACK ANTERIOR HIP CUSTOM (KITS) ×2 IMPLANT
PINNACLE SECTOR CUP 58MM (Hips) ×2 IMPLANT
SCREW 6.5MMX30MM (Screw) ×1 IMPLANT
SPONGE T-LAP 18X18 ~~LOC~~+RFID (SPONGE) ×2 IMPLANT
STEM FEM ACTIS HIGH SZ7 (Stem) ×1 IMPLANT
SUT MNCRL AB 4-0 PS2 18 (SUTURE) ×2 IMPLANT
SUT STRATAFIX 0 PDS 27 VIOLET (SUTURE) ×2
SUT VIC AB 1 CT1 36 (SUTURE) ×6 IMPLANT
SUT VIC AB 2-0 CT1 27 (SUTURE) ×4
SUT VIC AB 2-0 CT1 TAPERPNT 27 (SUTURE) ×2 IMPLANT
SUTURE STRATFX 0 PDS 27 VIOLET (SUTURE) ×1 IMPLANT
TRAY FOLEY MTR SLVR 16FR STAT (SET/KITS/TRAYS/PACK) ×1 IMPLANT
TUBE SUCTION HIGH CAP CLEAR NV (SUCTIONS) ×2 IMPLANT
WATER STERILE IRR 1000ML POUR (IV SOLUTION) ×3 IMPLANT

## 2022-04-24 NOTE — H&P (Signed)
TOTAL HIP ADMISSION H&P ? ?Patient is admitted for right total hip arthroplasty. ? ?Subjective: ? ?Chief Complaint: right hip pain ? ?HPI: William Pierce, 81 y.o. male, has a history of pain and functional disability in the right hip(s) due to arthritis and patient has failed non-surgical conservative treatments for greater than 12 weeks to include NSAID's and/or analgesics and activity modification.  Onset of symptoms was gradual starting 2 years ago with rapidlly worsening course since that time.The patient noted no past surgery on the right hip(s).  Patient currently rates pain in the right hip at 10 out of 10 with activity. Patient has worsening of pain with activity and weight bearing, pain that interfers with activities of daily living, and pain with passive range of motion. Patient has evidence of joint space narrowing and occult femoral neck fracture  by imaging studies. This condition presents safety issues increasing the risk of falls. There is no current active infection. ? ?Patient Active Problem List  ? Diagnosis Date Noted  ? Osteoarthritis of left knee 02/22/2021  ? Status post total left knee replacement 02/21/2021  ? Hyperlipidemia 02/25/2017  ? Abnormal stress test   ? Dyspnea on exertion   ? Expected blood loss anemia 01/28/2013  ? Overweight (BMI 25.0-29.9) 01/28/2013  ? S/P left THA, AA 01/27/2013  ? CAD (coronary artery disease) 07/05/2011  ? HTN (hypertension) 07/05/2011  ? ?Past Medical History:  ?Diagnosis Date  ? Allergic rhinitis   ? Aortic insufficiency   ? Arthritis   ? oa  ? Benign prostatic hypertrophy   ? Bladder stone   ? hematuria 04-17-22  saw urology bladder stone found will remove post hip surgery  ? Chronic kidney disease, stage 3 (HCC)   ? Coronary artery disease   ? a. s/p 3V CABG 2005.  ? Erectile dysfunction   ? GERD (gastroesophageal reflux disease)   ? Gout   ? History of kidney stones   ? Hyperlipemia   ? Hypertension   ? Hypothyroidism   ? Mitral regurgitation   ? Mitral  valve prolapse   ? Neuromuscular disorder ([place])   ? mild neuropathy  ? Plantar fasciitis   ? Tubular adenoma of colon   ?  ?Past Surgical History:  ?Procedure Laterality Date  ? CARDIAC CATHETERIZATION N/A 01/10/2017  ? Procedure: Left Heart Cath and Cors/Grafts Angiography;  Surgeon: Burnell Blanks, MD;  Location: [facility];  Service: Cardiovascular;  Laterality: N/A;  ? CORONARY ARTERY BYPASS GRAFT  LIMA to LAD, SVG to Intermediate, SVG to PDA  ? 2005 cabg x 3  ? Hernia surgery x 2    ? TOTAL HIP ARTHROPLASTY Left 01/27/2013  ? Procedure: TOTAL HIP ARTHROPLASTY ANTERIOR APPROACH;  Surgeon: Mauri Pole, MD;  Location: [facility];  Service: Orthopedics;  Laterality: Left;  ? TOTAL KNEE ARTHROPLASTY Left 02/21/2021  ? Procedure: TOTAL KNEE ARTHROPLASTY;  Surgeon: Paralee Cancel, MD;  Location: [facility];  Service: Orthopedics;  Laterality: Left;  70 mins  ?  ?Current Facility-Administered Medications  ?Medication Dose Route Frequency Provider Last Rate Last Admin  ? ceFAZolin (ANCEF) 2-4 GM/100ML-% IVPB           ? lactated ringers infusion   Intravenous Continuous Roderic Palau, MD      ? tranexamic acid (CYKLOKAPRON) '1000MG'$ /165m IVPB           ? ?No Known Allergies  ?Social History  ? ?Tobacco Use  ? Smoking status: Never  ? Smokeless tobacco: Never  ?  Substance Use Topics  ? Alcohol use: Yes  ?  Comment: social  ?  ?Family History  ?Problem Relation Age of Onset  ? Heart attack Father   ? Coronary artery disease Other   ?  ? ?Review of Systems  ?Constitutional:  Negative for chills and fever.  ?Respiratory:  Negative for cough and shortness of breath.   ?Cardiovascular:  Negative for chest pain.  ?Gastrointestinal:  Negative for nausea and vomiting.  ?Musculoskeletal:  Positive for arthralgias.  ? ? ?Objective: ? ?Physical Exam ?Constitutional:   ?   Appearance: Normal appearance. He is normal weight.  ?HENT:  ?   Head: Normocephalic and atraumatic.  ?Pulmonary:  ?   Effort: Pulmonary effort is  normal.  ?Neurological:  ?   Mental Status: He is alert.  ? ?Musculoskeletal: ?Right hip exam:  ?Otherwise active and passive range of motion of the right hip produces a lot of pain in his right hip whereas his left hip is otherwise normal. ?Lower extremity reveals no edema or erythema ? ?Vital signs in last 24 hours: ?Temp:  [97.7 ?F (36.5 ?C)] 97.7 ?F (36.5 ?C) (05/16 1315) ?Pulse Rate:  [59] 59 (05/16 1315) ?Resp:  [18] 18 (05/16 1315) ?BP: (140)/(84) 140/84 (05/16 1315) ?SpO2:  [100 %] 100 % (05/16 1315) ?Weight:  [74.8 kg] 74.8 kg (05/16 1330) ? ?Labs: ? ? ?Estimated body mass index is 23.68 kg/m? as calculated from the following: ?  Height as of this encounter: '5\' 10"'$  (1.778 m). ?  Weight as of this encounter: 74.8 kg. ? ? ?Imaging Review ?Plain radiographs demonstrate severe degenerative joint disease of the right hip(s). MRI also revealed likely nondisplaced femoral neck fracture.The bone quality appears to be adequate for age and reported activity level. ? ? ? ? ? ?Assessment/Plan: ? 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 ?The patient history, physical examination, clinical judgement of the provider and imaging studies are consistent with end stage degenerative joint disease of the right hip(s) and total hip arthroplasty is deemed medically necessary. The treatment options including medical management, injection therapy, arthroscopy and arthroplasty were discussed at length. The risks and benefits of total hip arthroplasty were presented and reviewed. The risks due to aseptic loosening, infection, stiffness, dislocation/subluxation,  thromboembolic complications and other imponderables were discussed.  The patient acknowledged the explanation, agreed to proceed with the plan and consent was signed. Patient is being admitted for inpatient treatment for surgery, pain control, PT, OT, prophylactic antibiotics, VTE prophylaxis, progressive ambulation and ADL's and  discharge planning.The patient is planning to be discharged  home. ? ? ?Costella Hatcher, PA-C ?Orthopedic Surgery ?[facility] ?[phone] ? ? ?

## 2022-04-24 NOTE — Transfer of Care (Signed)
Immediate Anesthesia Transfer of Care Note ? ?Patient: William Pierce ? ?Procedure(s) Performed: TOTAL HIP ARTHROPLASTY ANTERIOR APPROACH (Right: Hip) ? ?Patient Location: PACU ? ?Anesthesia Type:Spinal ? ?Level of Consciousness: awake, alert , oriented and patient cooperative ? ?Airway & Oxygen Therapy: Patient Spontanous Breathing and Patient connected to face mask oxygen ? ?Post-op Assessment: Report given to RN, Post -op Vital signs reviewed and stable and Post -op Vital signs reviewed and unstable, Anesthesiologist notified--MDA called to make aware of PACU BP; patient mentating appropriately; per MDA give crystalloid in PACU and let propofol wear off; report to PACU RN and she will give crystalloid and let propofol wear off per MDA ? ?Post vital signs: Reviewed, stable and unstable ? ?Last Vitals:  ?Vitals Value Taken Time  ?BP 85/59 04/24/22 1648  ?Temp    ?Pulse 55 04/24/22 1650  ?Resp 16 04/24/22 1650  ?SpO2 98 % 04/24/22 1650  ?Vitals shown include unvalidated device data. ? ?Last Pain:  ?Vitals:  ? 04/24/22 1330  ?TempSrc:   ?PainSc: 0-No pain  ?   ? ?Patients Stated Pain Goal: 4 (04/24/22 1330) ? ?Complications: No notable events documented. ?

## 2022-04-24 NOTE — Interval H&P Note (Signed)
History and Physical Interval Note: ? ?04/24/2022 ?2:31 PM ? ?William Pierce  has presented today for surgery, with the diagnosis of Right femoral neck fracture, osteoarthritis.  The various methods of treatment have been discussed with the patient and family. After consideration of risks, benefits and other options for treatment, the patient has consented to  Procedure(s): ?TOTAL HIP ARTHROPLASTY ANTERIOR APPROACH (Right) as a surgical intervention.  The patient's history has been reviewed, patient examined, no change in status, stable for surgery.  I have reviewed the patient's chart and labs.  Questions were answered to the patient's satisfaction.   ? ? ?Mauri Pole ? ? ?

## 2022-04-24 NOTE — Anesthesia Procedure Notes (Signed)
Spinal ? ?Patient location during procedure: OR ?Start time: 04/24/2022 3:13 PM ?End time: 04/24/2022 3:18 PM ?Reason for block: surgical anesthesia ?Staffing ?Performed: anesthesiologist  ?Anesthesiologist: [name], MD ?Preanesthetic Checklist ?Completed: patient identified, IV checked, risks and benefits discussed, surgical consent, monitors and equipment checked, pre-op evaluation and timeout performed ?Spinal Block ?Patient position: sitting ?Prep: DuraPrep ?Patient monitoring: cardiac monitor, continuous pulse ox and blood pressure ?Approach: midline ?Location: L3-4 ?Injection technique: single-shot ?Needle ?Needle type: Pencan  ?Needle gauge: 24 G ?Needle length: 9 cm ?Assessment ?Events: CSF return ?Additional Notes ?Functioning IV was confirmed and monitors were applied. Sterile prep and drape, including hand hygiene and sterile gloves were used. The patient was positioned and the spine was prepped. The skin was anesthetized with lidocaine.  Free flow of clear CSF was obtained prior to injecting local anesthetic into the CSF.  The spinal needle aspirated freely following injection.  The needle was carefully withdrawn.  The patient tolerated the procedure well.  ? ? ? ?

## 2022-04-24 NOTE — Anesthesia Preprocedure Evaluation (Addendum)
Anesthesia Evaluation  ?Patient identified by MRN, date of birth, ID band ?Patient awake ? ? ? ?Reviewed: ?Allergy & Precautions, NPO status , Patient's Chart, lab work & pertinent test results, reviewed documented beta blocker date and time  ? ?History of Anesthesia Complications ?Negative for: history of anesthetic complications ? ?Airway ?Mallampati: I ? ?TM Distance: >3 FB ?Neck ROM: Full ? ? ? Dental ?no notable dental hx. ? ?  ?Pulmonary ?neg pulmonary ROS,  ?  ?Pulmonary exam normal ? ? ? ? ? ? ? Cardiovascular ?hypertension, Pt. on home beta blockers and Pt. on medications ?+ CAD and + CABG (2005)  ?Normal cardiovascular exam ? ?TTE 2020: EF 55-60%, mild LVH, grade I DD, mildly reduced RV systolic function, moderate LAE, moderate MVP/MR, mild TR ?  ?Neuro/Psych ?negative neurological ROS ? negative psych ROS  ? GI/Hepatic ?Neg liver ROS, GERD  Medicated and Controlled,  ?Endo/Other  ?Hypothyroidism  ? Renal/GU ?Renal Insufficiency and CRFRenal disease  ?negative genitourinary ?  ?Musculoskeletal ? ?(+) Arthritis , Osteoarthritis,   ? Abdominal ?  ?Peds ? Hematology ?negative hematology ROS ?(+)   ?Anesthesia Other Findings ? ? Reproductive/Obstetrics ?negative OB ROS ? ?  ? ? ? ? ? ? ? ? ? ? ? ? ? ?  ?  ? ? ? ? ? ? ? ?Anesthesia Physical ?Anesthesia Plan ? ?ASA: 3 ? ?Anesthesia Plan: Spinal and MAC  ? ?Post-op Pain Management: Tylenol PO (pre-op)*  ? ?Induction: Intravenous ? ?PONV Risk Score and Plan: 1 and Treatment may vary due to age or medical condition, Propofol infusion and Ondansetron ? ?Airway Management Planned: Natural Airway and Simple Face Mask ? ?Additional Equipment: None ? ?Intra-op Plan:  ? ?Post-operative Plan: Extubation in OR ? ?Informed Consent: I have reviewed the patients History and Physical, chart, labs and discussed the procedure including the risks, benefits and alternatives for the proposed anesthesia with the patient or authorized representative  who has indicated his/her understanding and acceptance.  ? ? ? ?Dental advisory given ? ?Plan Discussed with: CRNA, Anesthesiologist and Surgeon ? ?Anesthesia Plan Comments: (Spinal. GA/LMA as backup plan. Norton Blizzard, MD  ?)  ? ? ? ? ? ?Anesthesia Quick Evaluation ? ?

## 2022-04-24 NOTE — Anesthesia Postprocedure Evaluation (Signed)
Anesthesia Post Note ? ?Patient: William Pierce ? ?Procedure(s) Performed: TOTAL HIP ARTHROPLASTY ANTERIOR APPROACH (Right: Hip) ? ?  ? ?Patient location during evaluation: Nursing Unit ?Anesthesia Type: Spinal ?Level of consciousness: oriented and awake and alert ?Pain management: pain level controlled ?Vital Signs Assessment: post-procedure vital signs reviewed and stable ?Respiratory status: spontaneous breathing and respiratory function stable ?Cardiovascular status: blood pressure returned to baseline and stable ?Postop Assessment: no headache, no backache, no apparent nausea or vomiting and patient able to bend at knees ?Anesthetic complications: no ? ? ?No notable events documented. ? ?Last Vitals:  ?Vitals:  ? 04/24/22 1745 04/24/22 1800  ?BP: 97/62 103/64  ?Pulse: (!) 48 (!) 46  ?Resp: 18 17  ?Temp:    ?SpO2: 99% 100%  ?  ?Last Pain:  ?Vitals:  ? 04/24/22 1800  ?TempSrc:   ?PainSc: 0-No pain  ? ? ?LLE Motor Response: No movement due to regional block (04/24/22 1802) ?LLE Sensation: No sensation (absent) (04/24/22 1802) ?RLE Motor Response: No movement due to regional block (04/24/22 1802) ?RLE Sensation: No sensation (absent) (04/24/22 1802) ?  ?  ? ?[name] ? ? ? ? ?

## 2022-04-24 NOTE — Discharge Instructions (Signed)

## 2022-04-24 NOTE — Op Note (Signed)
? NAME:  William Pierce                ACCOUNT NO.: [id number]  ?   ? MEDICAL RECORD NO.: [id number]  ?   ? FACILITY:  [facility]  ?   ? PHYSICIAN:  Mauri Pole  DATE OF BIRTH:  1941-08-05 ?   ? DATE OF PROCEDURE:  04/24/2022 ?   ?                             OPERATIVE REPORT  ?   ?   ? PREOPERATIVE DIAGNOSIS: Right  hip osteoarthritis.  ?   ? POSTOPERATIVE DIAGNOSIS:  Right hip osteoarthritis.  ?   ? PROCEDURE:  Right total hip replacement through an anterior approach  ? utilizing DePuy THR system, component size 58 mm pinnacle cup, a size 36+4 neutral  ? Altrex liner, a size 7 Hi Actis stem with a 36+5 Articuleze metal head ball.  ?   ? SURGEON:  Pietro Cassis. Alvan Dame, M.D.  ?   ? ASSISTANT:  Costella Hatcher, PA-C ?   ? ANESTHESIA:  Spinal.  ?   ? SPECIMENS:  None.  ?   ? COMPLICATIONS:  None.  ?   ? BLOOD LOSS:  250 cc ?   ? DRAINS:  None.  ?   ? INDICATION OF THE PROCEDURE:  William Pierce is a 81 y.o. male who had  ? presented to office for evaluation of right hip pain.  Radiographs revealed  ? progressive degenerative changes with bone-on-bone  ? articulation of the  hip joint, including subchondral cystic changes and osteophytes.  The patient had painful limited range of  ? motion significantly affecting their overall quality of life and function.  The patient was failing to   ? respond to conservative measures including medications and/or injections and activity modification and at this point was ready  ? to proceed with more definitive measures.  Consent was obtained for  ? benefit of pain relief.  Specific risks of infection, DVT, component  ? failure, dislocation, neurovascular injury, and need for revision surgery were reviewed in the office. ?   ? PROCEDURE IN DETAIL:  The patient was brought to operative theater.  ? Once adequate anesthesia, preoperative antibiotics, 2 gm of Ancef, 1 gm of Tranexamic Acid, and 10 mg of Decadron were administered, the patient was positioned supine on the  [employer] table.  Once the patient was safely positioned with adequate padding of boney prominences we predraped out the hip, and used fluoroscopy to confirm orientation of the pelvis.  ?   ? The right hip was then prepped and draped from proximal iliac crest to  ? mid thigh with a shower curtain technique.  ?   ? Time-out was performed identifying the patient, planned procedure, and the appropriate extremity.   ? ? An incision was then made 2 cm lateral to the  ? anterior superior iliac spine extending over the orientation of the  ? tensor fascia lata muscle and sharp dissection was carried down to the  ? fascia of the muscle.  ?   ? The fascia was then incised.  The muscle belly was identified and swept  ? laterally and retractor placed along the superior neck.  Following  ? cauterization of the circumflex vessels and removing some pericapsular  ? fat, a second cobra retractor was placed on the inferior neck.  A T-capsulotomy was made along the line of the  ? superior neck to the trochanteric fossa, then extended proximally and  ? distally.  Tag sutures were placed and the retractors were then placed  ? intracapsular.  We then identified the trochanteric fossa and  ? orientation of my neck cut and then made a neck osteotomy with the femur on traction.  The femoral  ? head was removed without difficulty or complication.  Traction was let  ? off and retractors were placed posterior and anterior around the  ? acetabulum.  ?   ? The labrum and foveal tissue were debrided.  I began reaming with a 52 mm  ? reamer and reamed up to 57 mm reamer with good bony bed preparation and a 58 mm ? cup was chosen.  The final 58 mm Pinnacle cup was then impacted under fluoroscopy to confirm the depth of penetration and orientation with respect to  ? Abduction and forward flexion.  A screw was placed into the ilium followed by the hole eliminator.  The final  ? 36+4 neutral Altrex liner was impacted with good visualized rim fit.  The  cup was positioned anatomically within the acetabular portion of the pelvis.  ?   ? At this point, the femur was rolled to 100 degrees.  Further capsule was  ? released off the inferior aspect of the femoral neck.  I then  ? released the superior capsule proximally.  With the leg in a neutral position the hook was placed laterally  ? along the femur under the vastus lateralis origin and elevated manually and then held in position using the hook attachment on the bed.  The leg was then extended and adducted with the leg rolled to 100  ? degrees of external rotation.  Retractors were placed along the medial calcar and posteriorly over the greater trochanter.  Once the proximal femur was fully  ? exposed, I used a box osteotome to set orientation.  I then began  ? broaching with the starting chili pepper broach and passed this by hand and then broached up to 7.  With the 7 broach in place I chose a high offset neck and did several trial reductions.  The offset was appropriate, leg lengths  ? appeared to be equal best matched with the +5 head ball trial confirmed radiographically.  ? Given these findings, I went ahead and dislocated the hip, repositioned all  ? retractors and positioned the right hip in the extended and abducted position.  ?The final 7 Hi Actis stem was  ? chosen and it was impacted down to the level of neck cut.  Based on this  ? and the trial reductions, a final 36+5 Articuleze metal head ball was chosen and  ? impacted onto a clean and dry trunnion, and the hip was reduced.  The  ? hip had been irrigated throughout the case again at this point.  I did  ? reapproximate the superior capsular leaflet to the anterior leaflet  ? using #1 Vicryl.  The fascia of the  ? tensor fascia lata muscle was then reapproximated using #1 Vicryl and #0 Stratafix sutures.  The  ? remaining wound was closed with 2-0 Vicryl and running 4-0 Monocryl.  ? The hip was cleaned, dried, and dressed sterilely using Dermabond and   ? Aquacel dressing.  The patient was then brought  ? to recovery room in stable condition tolerating the procedure well.  ? ? Costella Hatcher,  PA-C was present for the entirety of the case involved from  ? preoperative positioning, perioperative retractor management, general  ? facilitation of the case, as well as primary wound closure as assistant.  ?   ?   ?   ? Pietro Cassis Alvan Dame, M.D.  ?   ?   ?04/24/2022 3:23 PM  ?   ?   ?

## 2022-04-25 DIAGNOSIS — Z79899 Other long term (current) drug therapy: Secondary | ICD-10-CM | POA: Diagnosis not present

## 2022-04-25 DIAGNOSIS — N183 Chronic kidney disease, stage 3 unspecified: Secondary | ICD-10-CM | POA: Diagnosis not present

## 2022-04-25 DIAGNOSIS — E039 Hypothyroidism, unspecified: Secondary | ICD-10-CM | POA: Diagnosis not present

## 2022-04-25 DIAGNOSIS — Z951 Presence of aortocoronary bypass graft: Secondary | ICD-10-CM | POA: Diagnosis not present

## 2022-04-25 DIAGNOSIS — I2510 Atherosclerotic heart disease of native coronary artery without angina pectoris: Secondary | ICD-10-CM | POA: Diagnosis not present

## 2022-04-25 DIAGNOSIS — M1611 Unilateral primary osteoarthritis, right hip: Secondary | ICD-10-CM | POA: Diagnosis not present

## 2022-04-25 DIAGNOSIS — Z96652 Presence of left artificial knee joint: Secondary | ICD-10-CM | POA: Diagnosis not present

## 2022-04-25 DIAGNOSIS — I129 Hypertensive chronic kidney disease with stage 1 through stage 4 chronic kidney disease, or unspecified chronic kidney disease: Secondary | ICD-10-CM | POA: Diagnosis not present

## 2022-04-25 DIAGNOSIS — Z96642 Presence of left artificial hip joint: Secondary | ICD-10-CM | POA: Diagnosis not present

## 2022-04-25 LAB — BASIC METABOLIC PANEL
Anion gap: 6 (ref 5–15)
BUN: 21 mg/dL (ref 8–23)
CO2: 25 mmol/L (ref 22–32)
Calcium: 8.8 mg/dL — ABNORMAL LOW (ref 8.9–10.3)
Chloride: 103 mmol/L (ref 98–111)
Creatinine, Ser: 0.86 mg/dL (ref 0.61–1.24)
GFR, Estimated: >60 mL/min (ref >60)
Glucose, Bld: 141 mg/dL — ABNORMAL HIGH (ref 70–99)
Potassium: 4.4 mmol/L (ref 3.5–5.1)
Sodium: 134 mmol/L — ABNORMAL LOW (ref 135–145)

## 2022-04-25 LAB — CBC
HCT: 39 % (ref 39.0–52.0)
Hemoglobin: 12.9 g/dL — ABNORMAL LOW (ref 13.0–17.0)
MCH: 30.6 pg (ref 26.0–34.0)
MCHC: 33.1 g/dL (ref 30.0–36.0)
MCV: 92.6 fL (ref 80.0–100.0)
Platelets: 351 10*3/uL (ref 150–400)
RBC: 4.21 MIL/uL — ABNORMAL LOW (ref 4.22–5.81)
RDW: 12.7 % (ref 11.5–15.5)
WBC: 16.2 10*3/uL — ABNORMAL HIGH (ref 4.0–10.5)
nRBC: 0 % (ref 0.0–0.2)

## 2022-04-25 MED ORDER — DOCUSATE SODIUM 100 MG PO CAPS: 100.0000 mg | ORAL_CAPSULE | Freq: Two times a day (BID) | ORAL | 0 refills | Status: DC

## 2022-04-25 MED ORDER — HYDROCODONE-ACETAMINOPHEN 5-325 MG PO TABS: 1.0000 | ORAL_TABLET | ORAL | 0 refills | Status: DC | PRN

## 2022-04-25 MED ORDER — METHOCARBAMOL 500 MG PO TABS: 500.0000 mg | ORAL_TABLET | Freq: Four times a day (QID) | ORAL | 0 refills | Status: DC | PRN

## 2022-04-25 MED ORDER — ASPIRIN 81 MG PO CHEW: 81.0000 mg | CHEWABLE_TABLET | Freq: Two times a day (BID) | ORAL | 0 refills | Status: AC

## 2022-04-25 MED ORDER — CEFADROXIL 500 MG PO CAPS: 500.0000 mg | ORAL_CAPSULE | Freq: Two times a day (BID) | ORAL | 0 refills | Status: AC

## 2022-04-25 NOTE — Evaluation (Signed)
Physical Therapy Evaluation ?Patient Details ?Name: William Pierce ?MRN: [id number] ?DOB: Apr 26, 1941 ?Today's Date: 04/25/2022 ? ?History of Present Illness ? 81 yo male s/p R THA-DA 5/16. Hx of L TKA 3/22, gout, plantar fasciitis, L THA 2014  ?Clinical Impression ? On eval, pt was Min guard assist for mobility. He walked ~200 feet with a RW. Moderate pain with activity. Will plan to have a 2nd session prior to possible d/c home later today.    ?   ? ?Recommendations for follow up therapy are one component of a multi-disciplinary discharge planning process, led by the attending physician.  Recommendations may be updated based on patient status, additional functional criteria and insurance authorization. ? ?Follow Up Recommendations Follow physician's recommendations for discharge plan and follow up therapies ? ?  ?Assistance Recommended at Discharge PRN  ?Patient can return home with the following ? Help with stairs or ramp for entrance;Assist for transportation;Assistance with cooking/housework ? ?  ?Equipment Recommendations None recommended by PT  ?Recommendations for Other Services ?    ?  ?Functional Status Assessment Patient has had a recent decline in their functional status and demonstrates the ability to make significant improvements in function in a reasonable and predictable amount of time.  ? ?  ?Precautions / Restrictions Precautions ?Precautions: Fall ?Restrictions ?Weight Bearing Restrictions: No ?Other Position/Activity Restrictions: WBAT  ? ?  ? ?Mobility ? Bed Mobility ?Overal bed mobility: Needs Assistance ?Bed Mobility: Supine to Sit ?  ?  ?Supine to sit: Supervision, HOB elevated ?  ?  ?  ?  ? ?Transfers ?Overall transfer level: Needs assistance ?Equipment used: Rolling walker (2 wheels) ?Transfers: Sit to/from Stand ?Sit to Stand: Min guard ?  ?  ?  ?  ?  ?General transfer comment: Min guard for safety. Cues for safety, hand placement. ?  ? ?Ambulation/Gait ?Ambulation/Gait assistance: Min  guard ?Gait Distance (Feet): 200 Feet ?Assistive device: Rolling walker (2 wheels) ?Gait Pattern/deviations: Step-through pattern, Decreased stride length ?  ?  ?  ?General Gait Details: Min guard for safety. Tolerated distance well. No dizziness reported ? ?Stairs ?  ?  ?  ?  ?  ? ?Wheelchair Mobility ?  ? ?Modified Rankin (Stroke Patients Only) ?  ? ?  ? ?Balance Overall balance assessment: Mild deficits observed, not formally tested ?  ?  ?  ?  ?  ?  ?  ?  ?  ?  ?  ?  ?  ?  ?  ?  ?  ?  ?   ? ? ? ?Pertinent Vitals/Pain Pain Assessment ?Pain Assessment: 0-10 ?Pain Score: 4  ?Pain Location: R hip/thigh ?Pain Descriptors / Indicators: Discomfort, Sore ?Pain Intervention(s): Limited activity within patient's tolerance, Monitored during session, Repositioned  ? ? ?Home Living Family/patient expects to be discharged to:: Private residence ?Living Arrangements: Spouse/significant other ?Available Help at Discharge: Family ?Type of Home: House ?Home Access: Stairs to enter ?Entrance Stairs-Rails: Left ?Entrance Stairs-Number of Steps: 3 ?  ?Home Layout: One level ?Home Equipment: Conservation officer, nature (2 wheels) ?   ?  ?Prior Function Prior Level of Function : Independent/Modified Independent ?  ?  ?  ?  ?  ?  ?Mobility Comments: was using the RW prior to surgery ?  ?  ? ? ?Hand Dominance  ?   ? ?  ?Extremity/Trunk Assessment  ? Upper Extremity Assessment ?Upper Extremity Assessment: Overall WFL for tasks assessed ?  ? ?Lower Extremity Assessment ?Lower Extremity Assessment: Generalized weakness ?  ? ?  Cervical / Trunk Assessment ?Cervical / Trunk Assessment: Normal  ?Communication  ? Communication: No difficulties  ?Cognition Arousal/Alertness: Awake/alert ?Behavior During Therapy: [facility] for tasks assessed/performed ?Overall Cognitive Status: Within Functional Limits for tasks assessed ?  ?  ?  ?  ?  ?  ?  ?  ?  ?  ?  ?  ?  ?  ?  ?  ?  ?  ?  ? ?  ?General Comments   ? ?  ?Exercises Total Joint Exercises ?Ankle Circles/Pumps: AROM,  Both, 10 reps ?Quad Sets: AROM, Both, 10 reps ?Heel Slides: AROM, Right, 10 reps ?Hip ABduction/ADduction: AROM, Right, 10 reps, AAROM  ? ?Assessment/Plan  ?  ?PT Assessment Patient needs continued PT services  ?PT Problem List Decreased strength;Decreased mobility;Decreased activity tolerance;Decreased balance;Decreased knowledge of use of DME;Pain ? ?   ?  ?PT Treatment Interventions DME instruction;Therapeutic activities;Gait training;Therapeutic exercise;Patient/family education;Balance training;Functional mobility training   ? ?PT Goals (Current goals can be found in the Care Plan section)  ?Acute Rehab PT Goals ?Patient Stated Goal: regain independence ?PT Goal Formulation: With patient ?Time For Goal Achievement: 05/09/22 ?Potential to Achieve Goals: Good ? ?  ?Frequency 7X/week ?  ? ? ?Co-evaluation   ?  ?  ?  ?  ? ? ?  ?AM-PAC PT "6 Clicks" Mobility  ?Outcome Measure Help needed turning from your back to your side while in a flat bed without using bedrails?: A Little ?Help needed moving from lying on your back to sitting on the side of a flat bed without using bedrails?: A Little ?Help needed moving to and from a bed to a chair (including a wheelchair)?: A Little ?Help needed standing up from a chair using your arms (e.g., wheelchair or bedside chair)?: A Little ?Help needed to walk in hospital room?: A Little ?Help needed climbing 3-5 steps with a railing? : A Little ?6 Click Score: 18 ? ?  ?End of Session Equipment Utilized During Treatment: Gait belt ?Activity Tolerance: Patient tolerated treatment well ?Patient left: in chair;with call bell/phone within reach;with family/visitor present ?  ?PT Visit Diagnosis: Pain;Other abnormalities of gait and mobility (R26.89) ?Pain - Right/Left: Right ?Pain - part of body: Hip ?  ? ?Time: 0349-1791 ?PT Time Calculation (min) (ACUTE ONLY): 20 min ? ? ?Charges:   PT Evaluation ?$PT Eval Low Complexity: 1 Low ?  ?  ?   ? ? ? ?Kailoni Vahle P, PT ?Acute Rehabilitation   ?Office: [phone] ?Pager: [phone] ? ?  ? ?

## 2022-04-25 NOTE — TOC Transition Note (Signed)
Transition of Care (TOC) - CM/SW Discharge Note ? ?Patient Details  ?Name: William Pierce ?MRN: [id number] ?Date of Birth: 02/05/1941 ? ?Transition of Care (TOC) CM/SW Contact:  ?Megan S Glenn, LCSW ?Phone Number: ?04/25/2022, 10:58 AM ? ?Clinical Narrative: Patient is expected to discharge home after working with PT. CSW met with patient to confirm discharge plan. Patient will go home with a home exercise program (HEP). Patient has a rolling walker at home, so there are no DME needs at this time. TOC signing off. ? ?Final next level of care: OP Rehab ?Barriers to Discharge: No Barriers Identified ? ?Patient Goals and CMS Choice ?Patient states their goals for this hospitalization and ongoing recovery are:: Discharge home with HEP ?Choice offered to / list presented to : NA ? ?Discharge Plan and Services         ?DME Arranged: N/A ?DME Agency: NA ? ?Readmission Risk Interventions ?   ? View : No data to display.  ?  ?  ?  ? ?

## 2022-04-25 NOTE — Progress Notes (Signed)
? ?  Subjective: ?1 Day Post-Op Procedure(s) (LRB): ?TOTAL HIP ARTHROPLASTY ANTERIOR APPROACH (Right) ?Patient reports pain as mild.   ?Patient seen in rounds with Dr. Alvan Dame. ?Patient is resting in bed with his wife at the bedside. No acute events overnight. He has not been up with PT yet.  ?We will start therapy today.  ? ?Objective: ?Vital signs in last 24 hours: ?Temp:  [97.5 ?F (36.4 ?C)-98.6 ?F (37 ?C)] 97.9 ?F (36.6 ?C) (05/17 0515) ?Pulse Rate:  [44-59] 58 (05/17 0515) ?Resp:  [14-23] 17 (05/17 0515) ?BP: (85-150)/(59-84) 113/65 (05/17 0515) ?SpO2:  [94 %-100 %] 95 % (05/17 0515) ?Weight:  [74.8 kg] 74.8 kg (05/16 1330) ? ?Intake/Output from previous day: ? ?Intake/Output Summary (Last 24 hours) at 04/25/2022 0728 ?Last data filed at 04/25/2022 0459 ?Gross per 24 hour  ?Intake 3335.3 ml  ?Output 2395 ml  ?Net 940.3 ml  ?  ? ?Intake/Output this shift: ?No intake/output data recorded. ? ?Labs: ?Recent Labs  ?  04/24/22 ?1330 04/25/22 ?0337  ?HGB 16.4 12.9*  ? ?Recent Labs  ?  04/24/22 ?1330 04/25/22 ?0337  ?WBC 14.8* 16.2*  ?RBC 5.24 4.21*  ?HCT 47.7 39.0  ?PLT 478* 351  ? ?Recent Labs  ?  04/24/22 ?1330 04/25/22 ?0337  ?NA 133* 134*  ?K 4.3 4.4  ?CL 99 103  ?CO2 26 25  ?BUN 20 21  ?CREATININE 1.05 0.86  ?GLUCOSE 104* 141*  ?CALCIUM 9.9 8.8*  ? ?No results for input(s): LABPT, INR in the last 72 hours. ? ?Exam: ?General - Patient is Alert and Oriented ?Extremity - Neurologically intact ?Sensation intact distally ?Intact pulses distally ?Dorsiflexion/Plantar flexion intact ?Dressing - dressing C/D/I ?Motor Function - intact, moving foot and toes well on exam.  ? ?Past Medical History:  ?Diagnosis Date  ? Allergic rhinitis   ? Aortic insufficiency   ? Arthritis   ? oa  ? Benign prostatic hypertrophy   ? Bladder stone   ? hematuria 04-17-22  saw urology bladder stone found will remove post hip surgery  ? Chronic kidney disease, stage 3 (HCC)   ? Coronary artery disease   ? a. s/p 3V CABG 2005.  ? Erectile dysfunction    ? GERD (gastroesophageal reflux disease)   ? Gout   ? History of kidney stones   ? Hyperlipemia   ? Hypertension   ? Hypothyroidism   ? Mitral regurgitation   ? Mitral valve prolapse   ? Neuromuscular disorder ([place])   ? mild neuropathy  ? Plantar fasciitis   ? Tubular adenoma of colon   ? ? ?Assessment/Plan: ?1 Day Post-Op Procedure(s) (LRB): ?TOTAL HIP ARTHROPLASTY ANTERIOR APPROACH (Right) ?Principal Problem: ?  S/P total right hip arthroplasty ? ?Estimated body mass index is 23.68 kg/m? as calculated from the following: ?  Height as of this encounter: '5\' 10"'$  (1.778 m). ?  Weight as of this encounter: 74.8 kg. ?Advance diet ?Up with therapy ?D/C IV fluids ? ?DVT Prophylaxis - Aspirin ?Weight bearing as tolerated. ? ?Hgb stable at 12.9 this AM.  ? ?Plan is to go Home after hospital stay. Plan for discharge today after meeting goals with therapy. Follow up in the office in 2 weeks.  ? ?Griffith Citron, PA-C ?Orthopedic Surgery ?[phone] ?04/25/2022, 7:28 AM  ?

## 2022-04-25 NOTE — Progress Notes (Signed)
Physical Therapy Treatment ?Patient Details ?Name: William Pierce ?MRN: [id number] ?DOB: 1941/01/11 ?Today's Date: 04/25/2022 ? ? ?History of Present Illness 81 yo male s/p R THA-DA 5/16. Hx of L TKA 3/22, gout, plantar fasciitis, L THA 2014 ? ?  ?PT Comments  ? ? 2nd session to continue education on gait training, exercises, and stair training. Issued HEP for pt to perform 2x/day until follows up with surgeon. All education completed.    ?Recommendations for follow up therapy are one component of a multi-disciplinary discharge planning process, led by the attending physician.  Recommendations may be updated based on patient status, additional functional criteria and insurance authorization. ? ?Follow Up Recommendations ? Follow physician's recommendations for discharge plan and follow up therapies ?  ?  ?Assistance Recommended at Discharge PRN  ?Patient can return home with the following Help with stairs or ramp for entrance;Assist for transportation;Assistance with cooking/housework ?  ?Equipment Recommendations ? None recommended by PT  ?  ?Recommendations for Other Services   ? ? ?  ?Precautions / Restrictions Precautions ?Precautions: Fall ?Restrictions ?Weight Bearing Restrictions: No ?Other Position/Activity Restrictions: WBAT  ?  ? ?Mobility ? Bed Mobility ?Overal bed mobility: Needs Assistance ?Bed Mobility: Supine to Sit ?  ?  ?Supine to sit: Supervision, HOB elevated ?  ?  ?General bed mobility comments: oob in recliner ?  ? ?Transfers ?Overall transfer level: Needs assistance ?Equipment used: Rolling walker (2 wheels) ?Transfers: Sit to/from Stand ?Sit to Stand: Min guard ?  ?  ?  ?  ?  ?General transfer comment: Supv. Cues for safety, hand placement. ?  ? ?Ambulation/Gait ?Ambulation/Gait assistance: Supervision ?Gait Distance (Feet): 100 Feet ?Assistive device: Rolling walker (2 wheels) ?Gait Pattern/deviations: Step-through pattern, Decreased stride length ?  ?  ?  ?General Gait Details: Supv for  safety. Cues for posture, RW proximity. ? ? ?Stairs ?Stairs: Yes ?Stairs assistance: Min guard ?Stair Management: Step to pattern, One rail Left ?Number of Stairs: 2 ?General stair comments: Cues for safety, sequence. ? ? ?Wheelchair Mobility ?  ? ?Modified Rankin (Stroke Patients Only) ?  ? ? ?  ?Balance Overall balance assessment: Mild deficits observed, not formally tested ?  ?  ?  ?  ?  ?  ?  ?  ?  ?  ?  ?  ?  ?  ?  ?  ?  ?  ?  ? ?  ?Cognition Arousal/Alertness: Awake/alert ?Behavior During Therapy: [facility] for tasks assessed/performed ?Overall Cognitive Status: Within Functional Limits for tasks assessed ?  ?  ?  ?  ?  ?  ?  ?  ?  ?  ?  ?  ?  ?  ?  ?  ?  ?  ?  ? ?  ?Exercises Total Joint Exercises ?Ankle Circles/Pumps: AROM, Both, 10 reps ?Quad Sets: AROM, Both, 10 reps ?Heel Slides: AROM, Right, 10 reps ?Hip ABduction/ADduction: AROM, Right, 10 reps, Standing ?Long Arc Quad: AROM, Right, 10 reps, Seated ?Knee Flexion: AROM, Right, 10 reps, Standing ?Marching in Standing: AROM, Both, 10 reps, Standing ?General Exercises - Lower Extremity ?Heel Raises: AROM, Both, 10 reps, Standing ? ?  ?General Comments   ?  ?  ? ?Pertinent Vitals/Pain Pain Assessment ?Pain Assessment: 0-10 ?Pain Score: 5  ?Pain Location: R hip/thigh ?Pain Descriptors / Indicators: Discomfort, Sore ?Pain Intervention(s): Monitored during session, Repositioned, Ice applied  ? ? ?Home Living Family/patient expects to be discharged to:: Private residence ?Living Arrangements: Spouse/significant other ?Available Help at Discharge:  Family ?Type of Home: House ?Home Access: Stairs to enter ?Entrance Stairs-Rails: Left ?Entrance Stairs-Number of Steps: 3 ?  ?Home Layout: One level ?Home Equipment: Conservation officer, nature (2 wheels) ?   ?  ?Prior Function    ?  ?  ?   ? ?PT Goals (current goals can now be found in the care plan section) Acute Rehab PT Goals ?Patient Stated Goal: regain independence ?PT Goal Formulation: With patient ?Time For Goal Achievement:  05/09/22 ?Potential to Achieve Goals: Good ?Progress towards PT goals: Progressing toward goals ? ?  ?Frequency ? ? ? 7X/week ? ? ? ?  ?PT Plan Current plan remains appropriate  ? ? ?Co-evaluation   ?  ?  ?  ?  ? ?  ?AM-PAC PT "6 Clicks" Mobility   ?Outcome Measure ? Help needed turning from your back to your side while in a flat bed without using bedrails?: A Little ?Help needed moving from lying on your back to sitting on the side of a flat bed without using bedrails?: A Little ?Help needed moving to and from a bed to a chair (including a wheelchair)?: A Little ?Help needed standing up from a chair using your arms (e.g., wheelchair or bedside chair)?: A Little ?Help needed to walk in hospital room?: A Little ?Help needed climbing 3-5 steps with a railing? : A Little ?6 Click Score: 18 ? ?  ?End of Session Equipment Utilized During Treatment: Gait belt ?Activity Tolerance: Patient tolerated treatment well ?Patient left: in chair;with call bell/phone within reach;with family/visitor present ?  ?PT Visit Diagnosis: Pain;Other abnormalities of gait and mobility (R26.89) ?Pain - Right/Left: Right ?Pain - part of body: Hip ?  ? ? ?Time: 6333-5456 ?PT Time Calculation (min) (ACUTE ONLY): 14 min ? ?Charges:  $Gait Training: 8-22 mins          ?          ? ? ? ? ? ?Doreatha Massed, PT ?Acute Rehabilitation  ?Office: [phone] ?Pager: [phone] ? ?  ? ?

## 2022-04-25 NOTE — Plan of Care (Signed)
  Problem: Education: Goal: Knowledge of General Education information will improve Description: Including pain rating scale, medication(s)/side effects and non-pharmacologic comfort measures Outcome: Progressing   Problem: Activity: Goal: Risk for activity intolerance will decrease Outcome: Progressing   Problem: Pain Managment: Goal: General experience of comfort will improve Outcome: Progressing   

## 2022-04-27 ENCOUNTER — Encounter (HOSPITAL_COMMUNITY): Payer: Self-pay | Admitting: Orthopedic Surgery

## 2022-05-03 NOTE — Discharge Summary (Signed)
Patient ID: William Pierce MRN: [id number] DOB/AGE: 82-Dec-1942 82 y.o.  Admit date: 04/24/2022 Discharge date: 04/25/2022  Admission Diagnoses:  Right  hip osteoarthritis.   Discharge Diagnoses:  Principal Problem:   S/P total right hip arthroplasty   Past Medical History:  Diagnosis Date   Allergic rhinitis    Aortic insufficiency    Arthritis    oa   Benign prostatic hypertrophy    Bladder stone    hematuria 04-17-22  saw urology bladder stone found will remove post hip surgery   Chronic kidney disease, stage 3 ([place])    Coronary artery disease    a. s/p 3V CABG 2005.   Erectile dysfunction    GERD (gastroesophageal reflux disease)    Gout    History of kidney stones    Hyperlipemia    Hypertension    Hypothyroidism    Mitral regurgitation    Mitral valve prolapse    Neuromuscular disorder (HCC)    mild neuropathy   Plantar fasciitis    Tubular adenoma of colon     Surgeries: Procedure(s): TOTAL HIP ARTHROPLASTY ANTERIOR APPROACH on 04/24/2022   Consultants:   Discharged Condition: Improved  Hospital Course: William Pierce is an 81 y.o. male who was admitted 04/24/2022 for operative treatment ofS/P total right hip arthroplasty. Patient has severe unremitting pain that affects sleep, daily activities, and work/hobbies. After pre-op clearance the patient was taken to the operating room on 04/24/2022 and underwent  Procedure(s): TOTAL HIP ARTHROPLASTY ANTERIOR APPROACH.    Patient was given perioperative antibiotics:  Anti-infectives (From admission, onward)    Start     Dose/Rate Route Frequency Ordered Stop   04/25/22 0000  cefadroxil (DURICEF) 500 MG capsule        500 mg Oral 2 times daily 04/25/22 1123 05/02/22 2359   04/24/22 2100  ceFAZolin (ANCEF) IVPB 2g/100 mL premix        2 g 200 mL/hr over 30 Minutes Intravenous Every 6 hours 04/24/22 1859 04/25/22 0404   04/24/22 1430  ceFAZolin (ANCEF) IVPB 2g/100 mL premix        2 g 200 mL/hr over 30 Minutes  Intravenous On call to O.R. 04/24/22 1420 04/24/22 1544   04/24/22 1320  ceFAZolin (ANCEF) 2-4 GM/100ML-% IVPB       Note to Pharmacy: Randa Evens D: cabinet override      04/24/22 1320 04/24/22 1514        Patient was given sequential compression devices, early ambulation, and chemoprophylaxis to prevent DVT. Patient worked with PT and was meeting their goals regarding safe ambulation and transfers.  Patient benefited maximally from hospital stay and there were no complications.    Recent vital signs: No data found.   Recent laboratory studies: No results for input(s): WBC, HGB, HCT, PLT, NA, K, CL, CO2, BUN, CREATININE, GLUCOSE, INR, CALCIUM in the last 72 hours.  Invalid input(s): PT, 2   Discharge Medications:   Allergies as of 04/25/2022   No Known Allergies      Medication List     STOP taking these medications    aspirin EC 81 MG tablet Replaced by: aspirin 81 MG chewable tablet   predniSONE 5 MG (48) Tbpk tablet Commonly known as: STERAPRED UNI-PAK 48 TAB   traMADol 50 MG tablet Commonly known as: ULTRAM       TAKE these medications    allopurinol 300 MG tablet Commonly known as: ZYLOPRIM Take 300 mg by mouth daily.   aspirin 81 MG  chewable tablet Chew 1 tablet (81 mg total) by mouth 2 (two) times daily for 28 days. Replaces: aspirin EC 81 MG tablet   docusate sodium 100 MG capsule Commonly known as: COLACE Take 1 capsule (100 mg total) by mouth 2 (two) times daily.   ezetimibe 10 MG tablet Commonly known as: ZETIA Take 10 mg by mouth every morning.   finasteride 5 MG tablet Commonly known as: PROSCAR Take 5 mg by mouth daily.   Fish Oil 1200 MG Caps Take 1,200 mg by mouth daily.   HYDROcodone-acetaminophen 5-325 MG tablet Commonly known as: NORCO/VICODIN Take 1 tablet by mouth every 4 (four) hours as needed for severe pain.   levothyroxine 75 MCG tablet Commonly known as: SYNTHROID Take 75 mcg by mouth daily before breakfast.    methocarbamol 500 MG tablet Commonly known as: ROBAXIN Take 1 tablet (500 mg total) by mouth every 6 (six) hours as needed for muscle spasms.   metoprolol tartrate 50 MG tablet Commonly known as: LOPRESSOR Take 25 mg by mouth 2 (two) times daily.   nitroGLYCERIN 0.4 MG SL tablet Commonly known as: NITROSTAT Place 0.4 mg under the tongue every 5 (five) minutes as needed for chest pain.   omeprazole 20 MG capsule Commonly known as: PRILOSEC Take 20 mg by mouth every Monday, Wednesday, and Friday.   polyethylene glycol 17 g packet Commonly known as: MIRALAX / GLYCOLAX Take 17 g by mouth 2 (two) times daily as needed for moderate constipation.   rosuvastatin 10 MG tablet Commonly known as: CRESTOR Take 10 mg by mouth every morning.   sildenafil 100 MG tablet Commonly known as: VIAGRA Take 100 mg by mouth as needed for erectile dysfunction. 1 hr prior to sexual activity   tamsulosin 0.4 MG Caps capsule Commonly known as: FLOMAX Take 0.4 mg by mouth daily.   Vitamin D3 25 MCG (1000 UT) Caps Take 1,000 Units by mouth daily.       ASK your doctor about these medications    cefadroxil 500 MG capsule Commonly known as: DURICEF Take 1 capsule (500 mg total) by mouth 2 (two) times daily for 7 days. Ask about: Should I take this medication?               Discharge Care Instructions  (From admission, onward)           Start     Ordered   04/25/22 0000  Change dressing       Comments: Maintain surgical dressing until follow up in the clinic. If the edges start to pull up, may reinforce with tape. If the dressing is no longer working, may remove and cover with gauze and tape, but must keep the area dry and clean.  Call with any questions or concerns.   04/25/22 0745            Diagnostic Studies: DG Pelvis Portable  Result Date: 04/24/2022 CLINICAL DATA:  Right hip replacement. EXAM: PORTABLE PELVIS 1-2 VIEWS COMPARISON:  Right hip x-ray 04/24/2022.  FINDINGS: There is a right hip arthroplasty in anatomic alignment. There is surrounding soft tissue swelling and air compatible with recent surgery. No acute fractures are seen. Left hip arthroplasty is also in anatomic alignment. IMPRESSION: 1. New right hip arthroplasty in anatomic alignment. Electronically Signed   By: Ronney Asters M.D.   On: 04/24/2022 17:46   DG C-Arm 1-60 Min-No Report  Result Date: 04/24/2022 Fluoroscopy was utilized by the requesting physician.  No radiographic interpretation.  DG HIP UNILAT WITH PELVIS 2-3 VIEWS RIGHT  Result Date: 04/24/2022 CLINICAL DATA:  Fluoroscopic assistance for right hip arthroplasty EXAM: DG HIP (WITH OR WITHOUT PELVIS) 2-3V RIGHT COMPARISON:  None Available. FINDINGS: Fluoroscopic images show recent right hip arthroplasty. There is evidence of previous left hip arthroplasty. Fluoroscopic time was 9 seconds. Estimated radiation dose was 1.3 mGy. IMPRESSION: Fluoroscopic assistance was provided for right hip arthroplasty. Electronically Signed   By: Elmer Picker M.D.   On: 04/24/2022 16:36    Disposition: Discharge disposition: 01-Home or Self Care       Discharge Instructions     Call MD / Call 911   Complete by: As directed    If you experience chest pain or shortness of breath, CALL 911 and be transported to the hospital emergency room.  If you develope a fever above 101 F, pus (white drainage) or increased drainage or redness at the wound, or calf pain, call your surgeon's office.   Change dressing   Complete by: As directed    Maintain surgical dressing until follow up in the clinic. If the edges start to pull up, may reinforce with tape. If the dressing is no longer working, may remove and cover with gauze and tape, but must keep the area dry and clean.  Call with any questions or concerns.   Constipation Prevention   Complete by: As directed    Drink plenty of fluids.  Prune juice may be helpful.  You may use a stool  softener, such as Colace (over the counter) 100 mg twice a day.  Use MiraLax (over the counter) for constipation as needed.   Diet - low sodium heart healthy   Complete by: As directed    Increase activity slowly as tolerated   Complete by: As directed    Weight bearing as tolerated with assist device (walker, cane, etc) as directed, use it as long as suggested by your surgeon or therapist, typically at least 4-6 weeks.   Post-operative opioid taper instructions:   Complete by: As directed    POST-OPERATIVE OPIOID TAPER INSTRUCTIONS: It is important to wean off of your opioid medication as soon as possible. If you do not need pain medication after your surgery it is ok to stop day one. Opioids include: Codeine, Hydrocodone(Norco, Vicodin), Oxycodone(Percocet, oxycontin) and hydromorphone amongst others.  Long term and even short term use of opiods can cause: Increased pain response Dependence Constipation Depression Respiratory depression And more.  Withdrawal symptoms can include Flu like symptoms Nausea, vomiting And more Techniques to manage these symptoms Hydrate well Eat regular healthy meals Stay active Use relaxation techniques(deep breathing, meditating, yoga) Do Not substitute Alcohol to help with tapering If you have been on opioids for less than two weeks and do not have pain than it is ok to stop all together.  Plan to wean off of opioids This plan should start within one week post op of your joint replacement. Maintain the same interval or time between taking each dose and first decrease the dose.  Cut the total daily intake of opioids by one tablet each day Next start to increase the time between doses. The last dose that should be eliminated is the evening dose.      TED hose   Complete by: As directed    Use stockings (TED hose) for 2 weeks on both leg(s).  You may remove them at night for sleeping.        Follow-up Information  Paralee Cancel, MD.  Schedule an appointment as soon as possible for a visit in 2 week(s).   Specialty: Orthopedic Surgery Contact information: [address] [place] [address] [phone]                  Signed: Irving Copas 05/03/2022, 4:19 PM

## 2022-05-23 DIAGNOSIS — R634 Abnormal weight loss: Secondary | ICD-10-CM | POA: Diagnosis not present

## 2022-05-23 DIAGNOSIS — N200 Calculus of kidney: Secondary | ICD-10-CM | POA: Diagnosis not present

## 2022-05-23 DIAGNOSIS — R404 Transient alteration of awareness: Secondary | ICD-10-CM | POA: Diagnosis not present

## 2022-05-23 DIAGNOSIS — I129 Hypertensive chronic kidney disease with stage 1 through stage 4 chronic kidney disease, or unspecified chronic kidney disease: Secondary | ICD-10-CM | POA: Diagnosis not present

## 2022-05-23 DIAGNOSIS — E039 Hypothyroidism, unspecified: Secondary | ICD-10-CM | POA: Diagnosis not present

## 2022-05-24 ENCOUNTER — Other Ambulatory Visit (HOSPITAL_COMMUNITY): Payer: Self-pay | Admitting: Internal Medicine

## 2022-05-24 ENCOUNTER — Ambulatory Visit (HOSPITAL_COMMUNITY)
Admission: RE | Admit: 2022-05-24 | Discharge: 2022-05-24 | Disposition: A | Discharge status: Home / Self Care | Payer: Medicare HMO | Source: Ambulatory Visit | Attending: Internal Medicine | Admitting: Internal Medicine

## 2022-05-24 ENCOUNTER — Other Ambulatory Visit: Payer: Self-pay | Admitting: Internal Medicine

## 2022-05-24 DIAGNOSIS — R404 Transient alteration of awareness: Secondary | ICD-10-CM | POA: Diagnosis not present

## 2022-05-24 MED ORDER — GADOBUTROL 1 MMOL/ML IV SOLN
7.0000 mL | Freq: Once | INTRAVENOUS | Status: AC | PRN
  Administered 2022-05-24: 7 mL via INTRAVENOUS

## 2022-05-29 DIAGNOSIS — R404 Transient alteration of awareness: Secondary | ICD-10-CM | POA: Diagnosis not present

## 2022-05-29 DIAGNOSIS — R4689 Other symptoms and signs involving appearance and behavior: Secondary | ICD-10-CM | POA: Diagnosis not present

## 2022-05-31 ENCOUNTER — Ambulatory Visit: Payer: Self-pay | Admitting: Neurology

## 2022-06-11 ENCOUNTER — Other Ambulatory Visit: Payer: Self-pay | Admitting: Urology

## 2022-06-11 DIAGNOSIS — R351 Nocturia: Secondary | ICD-10-CM | POA: Diagnosis not present

## 2022-06-11 DIAGNOSIS — N401 Enlarged prostate with lower urinary tract symptoms: Secondary | ICD-10-CM | POA: Diagnosis not present

## 2022-06-11 DIAGNOSIS — R310 Gross hematuria: Secondary | ICD-10-CM | POA: Diagnosis not present

## 2022-06-11 DIAGNOSIS — R350 Frequency of micturition: Secondary | ICD-10-CM | POA: Diagnosis not present

## 2022-06-14 NOTE — Patient Instructions (Addendum)
DUE TO COVID-19 ONLY TWO VISITORS  (aged 82 and older)  IS ALLOWED TO COME WITH YOU AND STAY IN THE WAITING ROOM ONLY DURING PRE OP AND PROCEDURE.   **NO VISITORS ARE ALLOWED IN THE SHORT STAY AREA OR RECOVERY ROOM!!**  IF YOU WILL BE ADMITTED INTO THE HOSPITAL YOU ARE ALLOWED ONLY FOUR SUPPORT PEOPLE DURING VISITATION HOURS ONLY (7 AM -8PM)   The support person(s) must pass our screening, gel in and out Visitors GUEST BADGE MUST BE WORN VISIBLY  One adult visitor may remain with you overnight and MUST be in the room by 8 P.M.   You are not required to [employer] often Do NOT share personal items Notify your provider if you are in close contact with someone who has COVID or you develop fever 100.4 or greater, new onset of sneezing, cough, sore throat, shortness of breath or body aches.        Your procedure is scheduled on:  07-06-22   Report to [facility] Main Entrance    Report to admitting at 5:15 AM   Call this number if you have problems the morning of surgery [phone]   Do not eat food :After Midnight the night before surgery   After Midnight you may have the following liquids until 4:30 AM DAY OF SURGERY  Clear Liquid Diet Water Black Coffee (sugar ok, NO MILK/CREAM OR CREAMERS)  Tea (sugar ok, NO MILK/CREAM OR CREAMERS) regular and decaf                             Plain Jell-O (NO RED)                                           Fruit ices (not with fruit pulp, NO RED)                                     Popsicles (NO RED)                                                                  Juice: apple, WHITE grape, WHITE cranberry Sports drinks like Gatorade (NO RED) Clear broth(vegetable,chicken,beef)             If you have questions, please contact your surgeon's office.   FOLLOW ANY ADDITIONAL PRE OP INSTRUCTIONS YOU RECEIVED FROM YOUR SURGEON'S OFFICE!!!     Oral Hygiene is also important to reduce your risk of infection.                                     Remember - BRUSH YOUR TEETH THE MORNING OF SURGERY WITH YOUR REGULAR TOOTHPASTE   Do NOT smoke after Midnight  Take these medicines the morning of surgery with A SIP OF WATER: Allopurinol, Ezetimibe, Finasteride, Levothyroxine, Metoprolol, Omeprazole, Rosuvastatin, Tamsulosin   DO NOT [place]. PHARMACY WILL DISPENSE MEDICATIONS LISTED ON YOUR MEDICATION LIST  TO YOU DURING YOUR ADMISSION [place]!             You may not have any metal on your body including jewelry, and body piercing             Do not wear  lotions, powders, cologne, or deodorant              Men may shave face and neck.    Contacts, dentures or bridgework may not be worn into surgery.   Bring small overnight bag day of surgery.  Do not bring valuables to the hospital. [place].   Special Instructions: Bring a copy of your healthcare power of attorney and living will documents the day of surgery if you haven't scanned them before.  Please read over the following fact sheets you were given: IF YOU HAVE QUESTIONS ABOUT YOUR PRE-OP INSTRUCTIONS PLEASE CALL [facility] - Preparing for Surgery Before surgery, you can play an important role.  Because skin is not sterile, your skin needs to be as free of germs as possible.  You can reduce the number of germs on your skin by washing with CHG (chlorahexidine gluconate) soap before surgery.  CHG is an antiseptic cleaner which kills germs and bonds with the skin to continue killing germs even after washing. Please DO NOT use if you have an allergy to CHG or antibacterial soaps.  If your skin becomes reddened/irritated stop using the CHG and inform your nurse when you arrive at Short Stay. Do not shave (including legs and underarms) for at least 48 hours prior to the first CHG shower.  You may shave your face/neck.  Please follow these instructions  carefully:  1.  Shower with CHG Soap the night before surgery and the  morning of surgery.  2.  If you choose to wash your hair, wash your hair first as usual with your normal  shampoo.  3.  After you shampoo, rinse your hair and body thoroughly to remove the shampoo.                             4.  Use CHG as you would any other liquid soap.  You can apply chg directly to the skin and wash.  Gently with a scrungie or clean washcloth.  5.  Apply the CHG Soap to your body ONLY FROM THE NECK DOWN.   Do   not use on face/ open                           Wound or open sores. Avoid contact with eyes, ears mouth and   genitals (private parts).                       Wash face,  Genitals (private parts) with your normal soap.             6.  Wash thoroughly, paying special attention to the area where your    surgery  will be performed.  7.  Thoroughly rinse your body with warm water from the neck down.  8.  DO NOT shower/wash with your normal soap after using and rinsing off the CHG Soap.                9.  Pat yourself dry with a clean towel.  10.  Wear clean pajamas.            11.  Place clean sheets on your bed the night of your first shower and do not  sleep with pets. Day of Surgery : Do not apply any lotions/deodorants the morning of surgery.  Please wear clean clothes to the hospital/surgery center.  FAILURE TO FOLLOW THESE INSTRUCTIONS MAY RESULT IN THE CANCELLATION OF YOUR SURGERY  PATIENT SIGNATURE_________________________________  NURSE SIGNATURE__________________________________  ________________________________________________________________________

## 2022-06-14 NOTE — Progress Notes (Addendum)
COVID Vaccine Completed:  Yes   Date of COVID positive in last 90 days:  No  PCP - Prince Solian, MD Cardiologist - Darlina Guys, MD  Chest x-ray - N/A EKG - 01-10-22 Epic Stress Test - greater than 2 years Epic ECHO - 10-18-21 Epic Cardiac Cath - greater than 2 years Epic Pacemaker/ICD device last checked: Spinal Cord Stimulator:  N/A  Bowel Prep - N/A  Sleep Study - N/A CPAP -   Fasting Blood Sugar - N/A Checks Blood Sugar _____ times a day  Blood Thinner Instructions: Aspirin Instructions:  ASA 81.  To stop one week prior per patient Last Dose:  Activity level:   Can go up a flight of stairs and perform activities of daily living without stopping and without symptoms of chest pain or shortness of breath.  Able to exercise without symptoms  Anesthesia review: CAD, MVP, Hx of CABG, mitral regurg, aortic regurg,  HTN, CKD  Patient denies shortness of breath, fever, cough and chest pain at PAT appointment  Patient verbalized understanding of instructions that were given to them at the PAT appointment. Patient was also instructed that they will need to review over the PAT instructions again at home before surgery.

## 2022-06-20 DIAGNOSIS — Z96641 Presence of right artificial hip joint: Secondary | ICD-10-CM | POA: Diagnosis not present

## 2022-06-20 DIAGNOSIS — M7062 Trochanteric bursitis, left hip: Secondary | ICD-10-CM | POA: Diagnosis not present

## 2022-06-20 DIAGNOSIS — Z471 Aftercare following joint replacement surgery: Secondary | ICD-10-CM | POA: Diagnosis not present

## 2022-06-20 DIAGNOSIS — M25552 Pain in left hip: Secondary | ICD-10-CM | POA: Diagnosis not present

## 2022-06-26 ENCOUNTER — Encounter (HOSPITAL_COMMUNITY)
Admission: RE | Admit: 2022-06-26 | Discharge: 2022-06-26 | Disposition: A | Discharge status: Home / Self Care | Payer: Medicare HMO | Source: Ambulatory Visit | Attending: Urology | Admitting: Urology

## 2022-06-26 ENCOUNTER — Other Ambulatory Visit: Payer: Self-pay

## 2022-06-26 ENCOUNTER — Encounter (HOSPITAL_COMMUNITY): Payer: Self-pay

## 2022-06-26 VITALS — BP 138/75 | HR 59 | Temp 98.0°F | Resp 20 | Ht 69.5 in | Wt 167.4 lb

## 2022-06-26 DIAGNOSIS — I2510 Atherosclerotic heart disease of native coronary artery without angina pectoris: Secondary | ICD-10-CM | POA: Diagnosis not present

## 2022-06-26 DIAGNOSIS — Z01812 Encounter for preprocedural laboratory examination: Secondary | ICD-10-CM | POA: Insufficient documentation

## 2022-06-26 LAB — BASIC METABOLIC PANEL
Anion gap: 6 (ref 5–15)
BUN: 20 mg/dL (ref 8–23)
CO2: 24 mmol/L (ref 22–32)
Calcium: 9.7 mg/dL (ref 8.9–10.3)
Chloride: 109 mmol/L (ref 98–111)
Creatinine, Ser: 0.92 mg/dL (ref 0.61–1.24)
GFR, Estimated: >60 mL/min (ref >60)
Glucose, Bld: 125 mg/dL — ABNORMAL HIGH (ref 70–99)
Potassium: 3.7 mmol/L (ref 3.5–5.1)
Sodium: 139 mmol/L (ref 135–145)

## 2022-07-05 NOTE — H&P (Signed)
I have blood in my urine.     06/11/22: William Pierce returns today in f/u. He thinks he has seen more hematuria. He had a CT that showed a possible 1cm bladder stone and a mass at the base of the prostate that was felt to be most consistent with a middle lobe. He has come off of the finasteride because of some recent cognitive issues that lasted for 3 weeks and then resolved.   04/17/22: William Pierce is a former patient who I last saw in 2006. He is sent back for the evaluation of gross hematuria and a history of stones. His last Cr was normal in January. He had the onset of painless gross hematuria 2 weeks ago. Over the last 4 days he has seen blood every day but has no clots. He has some increased urgency and frequency. His IPSS is 22. He is on tamsulosin. He has a history of stones in the past. He has been dealing with right hip pain over the last 3 weeks. he has some right groin pain initially. He had a steroid injection on 4/19 but his symptoms have worsened. He needs a right hip replacement.    CC: AUA Questions Scoring.  HPI: William Pierce is a 81 year-old male established patient who is here for evaluation.      AUA Symptom Score: Less than 20% of the time he has the sensation of not emptying his bladder completely when finished urinating. Less than 20% of the time he has to urinate again fewer than two hours after he has finished urinating. He does not have to stop and start again several times when he urinates. Less than 50% of the time he finds it difficult to postpone urination. Less than 20% of the time he has a weak urinary stream. He never has to push or strain to begin urination. He has to get up to urinate 3 times from the time he goes to bed until the time he gets up in the morning.   Calculated AUA Symptom Score: 8    ALLERGIES: None   MEDICATIONS: Allopurinol  Aspirin  Metoprolol Succinate  Tamsulosin Hcl  Ezetimibe  Fish Oil  Fish Oil  Levothyroxine  Rosuvastatin Calcium   Vitamin D2     GU PSH: Locm 300-'399Mg'$ /Ml Iodine,1Ml - 04/17/2022       PSH Notes: Open heart surgery, left hip replacement, left knee replacement   NON-GU PSH: None   GU PMH: Bladder Stone - 04/17/2022 BPH w/LUTS, His Prostate is about 192m with a middle lobe. He could be managed with a TURP or a simple prostatectomy. - 04/17/2022 Gross hematuria, The gross hematuria appears to be secondary to a bladder stone and very large prostate with a middle lobe. I discussed options for treatment including stone removal and consideration of a TURP. He would like to get him hip fixed first and Dr. OAlvan Dameis planning to do that next week. I will have him start on finasteride and reviewed the side effects and will have him stop the ASA. He will need f/u when he recovers from the hip repair. - 04/17/2022 Urinary Frequency - 04/17/2022 Urinary Urgency - 04/17/2022      PMH Notes:  1898-12-10 00:00:00 - Note: Normal Routine History And Physical Senior Citizen (65-80)   NON-GU PMH: Arthritis GERD Gout Heart disease, unspecified Hypercholesterolemia Hypertension    FAMILY HISTORY: 4 sons - Other Death In The Family Father - Other Death In The Family Mother - Other Kidney Stones -  Runs in Family   SOCIAL HISTORY: Marital Status: Married Preferred Language: English; Ethnicity: Not Hispanic Or Latino; Race: White Current Smoking Status: Patient has never smoked.   Tobacco Use Assessment Completed: Used Tobacco in last 30 days? Does not use smokeless tobacco. Drinks 1 drink per week.  Does not use drugs. Drinks 1 caffeinated drink per day.    REVIEW OF SYSTEMS:    GU Review Male:   Patient reports frequent urination and get up at night to urinate. Patient denies hard to postpone urination, burning/ pain with urination, leakage of urine, stream starts and stops, trouble starting your stream, have to strain to urinate , erection problems, and penile pain.  Gastrointestinal (Upper):   Patient denies  nausea, vomiting, and indigestion/ heartburn.  Gastrointestinal (Lower):   Patient denies diarrhea and constipation.  Constitutional:   Patient denies fever, night sweats, weight loss, and fatigue.  Skin:   Patient denies skin rash/ lesion and itching.  Eyes:   Patient denies blurred vision and double vision.  Ears/ Nose/ Throat:   Patient denies sore throat and sinus problems.  Hematologic/Lymphatic:   Patient denies swollen glands and easy bruising.  Cardiovascular:   Patient denies chest pains and leg swelling.  Respiratory:   Patient denies cough and shortness of breath.  Endocrine:   Patient denies excessive thirst.  Musculoskeletal:   Patient denies back pain and joint pain.  Neurological:   Patient denies headaches and dizziness.  Psychologic:   Patient denies depression and anxiety.   Notes: Urgency    VITAL SIGNS: None   MULTI-SYSTEM PHYSICAL EXAMINATION:    Constitutional: Well-nourished. No physical deformities. Normally developed. Good grooming.   Respiratory: Normal breath sounds. No labored breathing, no use of accessory muscles.   Cardiovascular: Regular rate and rhythm. No murmur, no gallop.      Complexity of Data:  Records Review:   AUA Symptom Score, Previous Patient Records  Urine Test Review:   Urinalysis  Urodynamics Review:   Review Flow Rate  X-Ray Review: C.T. Hematuria: Reviewed Films. Reviewed Report. Discussed With Patient.     PROCEDURES:         Flexible Cystoscopy - 52000  Risks, benefits, and some of the potential complications of the procedure were discussed. 27m of 2% lidocaine jelly was instilled intraurethrally.  Cipro '500mg'$  given for antibiotic prophylaxis.     Meatus:  Normal size. Normal location. Normal condition.  Urethra:  No strictures.  External Sphincter:  Normal.  Verumontanum:  Normal.  Prostate:  Obstructing. Enlarged median lobe. No hyperplasia. There was a 1cm bladder stone in the proximal prostatic urethra that was pushed  back into the bladder.   Bladder Neck:  Non-obstructing.  Ureteral Orifices:  Normal location. Normal size. Normal shape. Effluxed clear urine.  Bladder:  Mild trabeculation. No tumors. Normal mucosa. No stones.      The procedure was well tolerated and there were no complications.        Flow Rate - 51741  Total Void Time: 0:05 min:sec  Average Flow Rate: 6 cc/sec  Peak Flow Rate: 7 cc/sec  Time of Peak Flow: 0:18 min:sec  Voided Volume: 125 cc  Flow Time: 0:18 min:sec         Urinalysis w/Scope Dipstick Dipstick Cont'd Micro  Color: Yellow Bilirubin: Neg mg/dL WBC/hpf: 0 - 5/hpf  Appearance: Clear Ketones: Neg mg/dL RBC/hpf: 0 - 2/hpf  Specific Gravity: 1.025 Blood: 1+ ery/uL Bacteria: Rare (0-9/hpf)  pH: 5.5 Protein: Neg mg/dL Cystals: NS (Not  Seen)  Glucose: Neg mg/dL Urobilinogen: 0.2 mg/dL Casts: NS (Not Seen)    Nitrites: Neg Trichomonas: Not Present    Leukocyte Esterase: Trace leu/uL Mucous: Not Present      Epithelial Cells: NS (Not Seen)      Yeast: NS (Not Seen)      Sperm: Not Present    Notes: QNS for spun micro    ASSESSMENT:      ICD-10 Details  1 GU:   Gross hematuria - R31.0 Chronic, Stable - He continues to have some hematuria that appears to be secondary to a 1cm bladder stone that was in the proximal prostatic urethra. I have discussed the options for management and with his reduced stream and LUTS, I will get him set up for cystolithalopaxy and TURP. I reviewd the risks of a TURP including bleeding, infection, incontinence, stricture, need for secondary procedures, ejaculatory and erectile dysfunction, thrombotic events, fluid overload and anesthetic complications. I explained that 95% of men will have relief of the obstructive symptoms and about 70% will have relief of the irritative symptoms.    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   4   Urinary  Frequency - R35.0 Chronic, Stable  5   Nocturia - R35.1 Chronic, Stable   PLAN:            Medications Stop Meds: Prednisone  Discontinue: 06/11/2022  - Reason: The medication cycle was completed.  Finasteride 5 mg tablet 1 tablet PO Daily  Start: 04/17/2022  Stop: 04/12/2023   Discontinue: 06/11/2022  - Reason: The patient suffered unacceptable side effects.

## 2022-07-06 ENCOUNTER — Ambulatory Visit (HOSPITAL_BASED_OUTPATIENT_CLINIC_OR_DEPARTMENT_OTHER): Payer: Medicare HMO | Admitting: Anesthesiology

## 2022-07-06 ENCOUNTER — Other Ambulatory Visit: Payer: Self-pay

## 2022-07-06 ENCOUNTER — Encounter (HOSPITAL_COMMUNITY): Payer: Self-pay | Admitting: Urology

## 2022-07-06 ENCOUNTER — Ambulatory Visit (HOSPITAL_COMMUNITY): Payer: Medicare HMO | Admitting: Physician Assistant

## 2022-07-06 ENCOUNTER — Encounter (HOSPITAL_COMMUNITY)
Admission: RE | Disposition: A | Discharge status: Home / Self Care | Payer: Self-pay | Source: Ambulatory Visit | Attending: Urology

## 2022-07-06 ENCOUNTER — Observation Stay (HOSPITAL_COMMUNITY)
Admission: RE | Admit: 2022-07-06 | Discharge: 2022-07-07 | Disposition: A | Discharge status: Home / Self Care | Payer: Medicare HMO | Source: Ambulatory Visit | Attending: Urology | Admitting: Urology

## 2022-07-06 DIAGNOSIS — N400 Enlarged prostate without lower urinary tract symptoms: Secondary | ICD-10-CM | POA: Diagnosis not present

## 2022-07-06 DIAGNOSIS — M1990 Unspecified osteoarthritis, unspecified site: Secondary | ICD-10-CM

## 2022-07-06 DIAGNOSIS — R350 Frequency of micturition: Secondary | ICD-10-CM | POA: Insufficient documentation

## 2022-07-06 DIAGNOSIS — Z96652 Presence of left artificial knee joint: Secondary | ICD-10-CM | POA: Insufficient documentation

## 2022-07-06 DIAGNOSIS — Z7982 Long term (current) use of aspirin: Secondary | ICD-10-CM | POA: Diagnosis not present

## 2022-07-06 DIAGNOSIS — N210 Calculus in bladder: Secondary | ICD-10-CM

## 2022-07-06 DIAGNOSIS — N138 Other obstructive and reflux uropathy: Secondary | ICD-10-CM | POA: Diagnosis not present

## 2022-07-06 DIAGNOSIS — E039 Hypothyroidism, unspecified: Secondary | ICD-10-CM

## 2022-07-06 DIAGNOSIS — Z79899 Other long term (current) drug therapy: Secondary | ICD-10-CM | POA: Insufficient documentation

## 2022-07-06 DIAGNOSIS — R310 Gross hematuria: Secondary | ICD-10-CM | POA: Insufficient documentation

## 2022-07-06 DIAGNOSIS — I119 Hypertensive heart disease without heart failure: Secondary | ICD-10-CM | POA: Insufficient documentation

## 2022-07-06 DIAGNOSIS — R351 Nocturia: Secondary | ICD-10-CM | POA: Insufficient documentation

## 2022-07-06 DIAGNOSIS — Z96643 Presence of artificial hip joint, bilateral: Secondary | ICD-10-CM | POA: Diagnosis not present

## 2022-07-06 DIAGNOSIS — N401 Enlarged prostate with lower urinary tract symptoms: Secondary | ICD-10-CM | POA: Diagnosis not present

## 2022-07-06 DIAGNOSIS — I2510 Atherosclerotic heart disease of native coronary artery without angina pectoris: Secondary | ICD-10-CM | POA: Insufficient documentation

## 2022-07-06 DIAGNOSIS — I10 Essential (primary) hypertension: Secondary | ICD-10-CM | POA: Diagnosis not present

## 2022-07-06 HISTORY — PX: CYSTOSCOPY WITH LITHOLAPAXY: SHX1425

## 2022-07-06 HISTORY — PX: TRANSURETHRAL RESECTION OF PROSTATE: SHX73

## 2022-07-06 SURGERY — CYSTOSCOPY, WITH BLADDER CALCULUS LITHOLAPAXY
Anesthesia: General | Site: Prostate

## 2022-07-06 MED ORDER — EZETIMIBE 10 MG PO TABS
10.0000 mg | ORAL_TABLET | Freq: Every morning | ORAL | Status: DC
  Administered 2022-07-07: 10 mg via ORAL
  Filled 2022-07-06 (×2): qty 1

## 2022-07-06 MED ORDER — FLEET ENEMA 7-19 GM/118ML RE ENEM: 1.0000 | ENEMA | Freq: Once | RECTAL | Status: DC | PRN

## 2022-07-06 MED ORDER — HYDROMORPHONE HCL 1 MG/ML IJ SOLN
0.2500 mg | INTRAMUSCULAR | Status: DC | PRN
  Administered 2022-07-06: 0.25 mg via INTRAVENOUS

## 2022-07-06 MED ORDER — HYDROMORPHONE HCL 1 MG/ML IJ SOLN
INTRAMUSCULAR | Status: AC
  Filled 2022-07-06: qty 1

## 2022-07-06 MED ORDER — OXYCODONE HCL 5 MG PO TABS: 5.0000 mg | ORAL_TABLET | Freq: Once | ORAL | Status: DC | PRN

## 2022-07-06 MED ORDER — STERILE WATER FOR IRRIGATION IR SOLN
Status: DC | PRN
  Administered 2022-07-06: 500 mL

## 2022-07-06 MED ORDER — SENNOSIDES-DOCUSATE SODIUM 8.6-50 MG PO TABS
1.0000 | ORAL_TABLET | Freq: Every evening | ORAL | Status: DC | PRN
  Filled 2022-07-06: qty 1

## 2022-07-06 MED ORDER — PROMETHAZINE HCL 25 MG/ML IJ SOLN: 6.2500 mg | INTRAMUSCULAR | Status: DC | PRN

## 2022-07-06 MED ORDER — ALLOPURINOL 300 MG PO TABS
300.0000 mg | ORAL_TABLET | Freq: Every day | ORAL | Status: DC
  Administered 2022-07-07: 300 mg via ORAL
  Filled 2022-07-06 (×2): qty 1

## 2022-07-06 MED ORDER — PROPOFOL 10 MG/ML IV BOLUS
INTRAVENOUS | Status: AC
  Filled 2022-07-06: qty 20

## 2022-07-06 MED ORDER — EPHEDRINE SULFATE-NACL 50-0.9 MG/10ML-% IV SOSY
PREFILLED_SYRINGE | INTRAVENOUS | Status: DC | PRN
  Administered 2022-07-06: 10 mg via INTRAVENOUS

## 2022-07-06 MED ORDER — PANTOPRAZOLE SODIUM 40 MG PO TBEC
40.0000 mg | DELAYED_RELEASE_TABLET | Freq: Every day | ORAL | Status: DC
  Administered 2022-07-07: 40 mg via ORAL
  Filled 2022-07-06 (×2): qty 1

## 2022-07-06 MED ORDER — EPHEDRINE 5 MG/ML INJ
INTRAVENOUS | Status: AC
  Filled 2022-07-06: qty 5

## 2022-07-06 MED ORDER — CEFAZOLIN SODIUM-DEXTROSE 2-4 GM/100ML-% IV SOLN
2.0000 g | INTRAVENOUS | Status: AC
  Administered 2022-07-06: 2 g via INTRAVENOUS

## 2022-07-06 MED ORDER — ONDANSETRON HCL 4 MG/2ML IJ SOLN
INTRAMUSCULAR | Status: AC
  Filled 2022-07-06: qty 2

## 2022-07-06 MED ORDER — LIDOCAINE HCL (CARDIAC) PF 100 MG/5ML IV SOSY
PREFILLED_SYRINGE | INTRAVENOUS | Status: DC | PRN
  Administered 2022-07-06: 60 mg via INTRAVENOUS

## 2022-07-06 MED ORDER — DEXAMETHASONE SODIUM PHOSPHATE 10 MG/ML IJ SOLN
INTRAMUSCULAR | Status: AC
  Filled 2022-07-06: qty 1

## 2022-07-06 MED ORDER — HYDROCODONE-ACETAMINOPHEN 5-325 MG PO TABS: 1.0000 | ORAL_TABLET | ORAL | Status: DC | PRN

## 2022-07-06 MED ORDER — LEVOTHYROXINE SODIUM 25 MCG PO TABS
75.0000 ug | ORAL_TABLET | Freq: Every day | ORAL | Status: DC
  Administered 2022-07-07: 75 ug via ORAL
  Filled 2022-07-06 (×2): qty 1

## 2022-07-06 MED ORDER — LACTATED RINGERS IV SOLN: INTRAVENOUS | Status: DC

## 2022-07-06 MED ORDER — POTASSIUM CHLORIDE IN NACL 20-0.45 MEQ/L-% IV SOLN
INTRAVENOUS | Status: DC
  Filled 2022-07-06 (×3): qty 1000

## 2022-07-06 MED ORDER — ONDANSETRON HCL 4 MG/2ML IJ SOLN
INTRAMUSCULAR | Status: DC | PRN
  Administered 2022-07-06: 4 mg via INTRAVENOUS

## 2022-07-06 MED ORDER — HYOSCYAMINE SULFATE 0.125 MG SL SUBL: 0.1250 mg | SUBLINGUAL_TABLET | SUBLINGUAL | Status: DC | PRN

## 2022-07-06 MED ORDER — BISACODYL 10 MG RE SUPP
10.0000 mg | Freq: Every day | RECTAL | Status: DC | PRN
  Filled 2022-07-06: qty 1

## 2022-07-06 MED ORDER — SODIUM CHLORIDE 0.9 % IR SOLN
Status: DC | PRN
  Administered 2022-07-06: 3000 mL via INTRAVESICAL
  Administered 2022-07-06 (×2): 6000 mL via INTRAVESICAL

## 2022-07-06 MED ORDER — AMISULPRIDE (ANTIEMETIC) 5 MG/2ML IV SOLN: 10.0000 mg | Freq: Once | INTRAVENOUS | Status: DC | PRN

## 2022-07-06 MED ORDER — MEPERIDINE HCL 50 MG/ML IJ SOLN: 6.2500 mg | INTRAMUSCULAR | Status: DC | PRN

## 2022-07-06 MED ORDER — ONDANSETRON HCL 4 MG/2ML IJ SOLN
4.0000 mg | INTRAMUSCULAR | Status: DC | PRN
Start: 2022-07-06 — End: 2022-07-07

## 2022-07-06 MED ORDER — NITROGLYCERIN 0.4 MG SL SUBL
0.4000 mg | SUBLINGUAL_TABLET | SUBLINGUAL | Status: DC | PRN
Start: 2022-07-06 — End: 2022-07-07

## 2022-07-06 MED ORDER — ORAL CARE MOUTH RINSE: 15.0000 mL | Freq: Once | OROMUCOSAL | Status: AC

## 2022-07-06 MED ORDER — CEFAZOLIN SODIUM-DEXTROSE 1-4 GM/50ML-% IV SOLN
1.0000 g | Freq: Three times a day (TID) | INTRAVENOUS | Status: DC
  Administered 2022-07-06 – 2022-07-07 (×3): 1 g via INTRAVENOUS
  Filled 2022-07-06 (×4): qty 50

## 2022-07-06 MED ORDER — METOPROLOL TARTRATE 25 MG PO TABS
25.0000 mg | ORAL_TABLET | Freq: Two times a day (BID) | ORAL | Status: DC
  Administered 2022-07-06 – 2022-07-07 (×2): 25 mg via ORAL
  Filled 2022-07-06 (×3): qty 1

## 2022-07-06 MED ORDER — HYDROMORPHONE HCL 1 MG/ML IJ SOLN: 0.5000 mg | INTRAMUSCULAR | Status: DC | PRN

## 2022-07-06 MED ORDER — OXYCODONE HCL 5 MG/5ML PO SOLN: 5.0000 mg | Freq: Once | ORAL | Status: DC | PRN

## 2022-07-06 MED ORDER — FENTANYL CITRATE (PF) 100 MCG/2ML IJ SOLN
INTRAMUSCULAR | Status: DC | PRN
  Administered 2022-07-06 (×2): 25 ug via INTRAVENOUS

## 2022-07-06 MED ORDER — CHLORHEXIDINE GLUCONATE 0.12 % MT SOLN
15.0000 mL | Freq: Once | OROMUCOSAL | Status: AC
  Administered 2022-07-06: 15 mL via OROMUCOSAL

## 2022-07-06 MED ORDER — ROSUVASTATIN CALCIUM 10 MG PO TABS
10.0000 mg | ORAL_TABLET | Freq: Every morning | ORAL | Status: DC
  Administered 2022-07-07: 10 mg via ORAL
  Filled 2022-07-06 (×2): qty 1

## 2022-07-06 MED ORDER — ACETAMINOPHEN 325 MG PO TABS: 650.0000 mg | ORAL_TABLET | ORAL | Status: DC | PRN

## 2022-07-06 MED ORDER — PROPOFOL 10 MG/ML IV BOLUS
INTRAVENOUS | Status: DC | PRN
  Administered 2022-07-06: 170 mg via INTRAVENOUS

## 2022-07-06 MED ORDER — FENTANYL CITRATE (PF) 100 MCG/2ML IJ SOLN
INTRAMUSCULAR | Status: AC
  Filled 2022-07-06: qty 2

## 2022-07-06 MED ORDER — CEFAZOLIN SODIUM-DEXTROSE 2-4 GM/100ML-% IV SOLN
INTRAVENOUS | Status: AC
  Filled 2022-07-06: qty 100

## 2022-07-06 MED ORDER — SODIUM CHLORIDE 0.9 % IR SOLN
3000.0000 mL | Status: DC
Start: 2022-07-06 — End: 2022-07-07
  Administered 2022-07-06 (×3): 3000 mL

## 2022-07-06 SURGICAL SUPPLY — 25 items
BAG DRN RND TRDRP ANRFLXCHMBR (UROLOGICAL SUPPLIES) ×2
BAG URINE DRAIN 2000ML AR STRL (UROLOGICAL SUPPLIES) ×1 IMPLANT
BAG URO CATCHER STRL LF (MISCELLANEOUS) ×3 IMPLANT
CATH FOLEY 3WAY 30CC 22FR (CATHETERS) ×1 IMPLANT
CATH URETL OPEN 5X70 (CATHETERS) IMPLANT
CLOTH BEACON ORANGE TIMEOUT ST (SAFETY) ×3 IMPLANT
DRAPE FOOT SWITCH (DRAPES) ×3 IMPLANT
ELECT REM PT RETURN 15FT ADLT (MISCELLANEOUS) ×3 IMPLANT
GLOVE SURG SS PI 8.0 STRL IVOR (GLOVE) IMPLANT
GOWN STRL REUS W/ TWL XL LVL3 (GOWN DISPOSABLE) ×2 IMPLANT
GOWN STRL REUS W/TWL XL LVL3 (GOWN DISPOSABLE) ×3
HOLDER FOLEY CATH W/STRAP (MISCELLANEOUS) ×1 IMPLANT
IV NS IRRIG 3000ML ARTHROMATIC (IV SOLUTION) ×5 IMPLANT
KIT TURNOVER KIT A (KITS) IMPLANT
LASER FIB FLEXIVA PULSE ID 550 (Laser) IMPLANT
LASER FIB FLEXIVA PULSE ID 910 (Laser) IMPLANT
LOOP CUT BIPOLAR 24F LRG (ELECTROSURGICAL) ×1 IMPLANT
MANIFOLD NEPTUNE II (INSTRUMENTS) ×3 IMPLANT
PACK CYSTO (CUSTOM PROCEDURE TRAY) ×3 IMPLANT
SYR 30ML LL (SYRINGE) ×1 IMPLANT
SYR TOOMEY IRRIG 70ML (MISCELLANEOUS)
SYRINGE TOOMEY IRRIG 70ML (MISCELLANEOUS) IMPLANT
TUBING CONNECTING 10 (TUBING) ×3 IMPLANT
TUBING UROLOGY SET (TUBING) ×3 IMPLANT
WATER STERILE IRR 500ML POUR (IV SOLUTION) ×1 IMPLANT

## 2022-07-06 NOTE — Anesthesia Postprocedure Evaluation (Signed)
Anesthesia Post Note  Patient: William Pierce  Procedure(s) Performed: CYSTOSCOPY WITH LITHOLAPAXY (Bladder) TRANSURETHRAL RESECTION OF THE PROSTATE (TURP) (Prostate)     Patient location during evaluation: PACU Anesthesia Type: General Level of consciousness: awake and alert Pain management: pain level controlled Vital Signs Assessment: post-procedure vital signs reviewed and stable Respiratory status: spontaneous breathing, nonlabored ventilation and respiratory function stable Cardiovascular status: blood pressure returned to baseline and stable Postop Assessment: no apparent nausea or vomiting Anesthetic complications: no   No notable events documented.  Last Vitals:  Vitals:   07/06/22 1000 07/06/22 1005  BP: 134/72   Pulse: (!) 49 (!) 49  Resp: 14 12  Temp:    SpO2: 96% 96%    Last Pain:  Vitals:   07/06/22 1005  TempSrc:   PainSc: Asleep                 Lynda Rainwater

## 2022-07-06 NOTE — Progress Notes (Signed)
Pacu RN Report to floor given  Gave report to [employer]. Room: 1436   Discussed surgery, meds given in OR and Pacu, VS, IV fluids given, EBL, urine output, pain and other pertinent information. Also discussed if pt had any family or friends here or belongings with them.   Pt on 3 way foley w/ cont CBI, running at moderate, urine is fruit punch pink, no clots. Discussed pain med given, pt ate lunch and his IV 1/2 NS w/ 20Kcl is running and scanned at 100 ml/hr.   Wife has been updated.   Pt exits my care.

## 2022-07-06 NOTE — Transfer of Care (Signed)
Immediate Anesthesia Transfer of Care Note  Patient: William Pierce  Procedure(s) Performed: CYSTOSCOPY WITH LITHOLAPAXY (Bladder) TRANSURETHRAL RESECTION OF THE PROSTATE (TURP) (Prostate)  Patient Location: PACU  Anesthesia Type:General  Level of Consciousness: sedated  Airway & Oxygen Therapy: Patient Spontanous Breathing and Patient connected to face mask oxygen  Post-op Assessment: Report given to RN and Post -op Vital signs reviewed and stable  Post vital signs: Reviewed and stable  Last Vitals:  Vitals Value Taken Time  BP 130/67 07/06/22 0906  Temp    Pulse 63 07/06/22 0907  Resp 15 07/06/22 0907  SpO2 100 % 07/06/22 0907  Vitals shown include unvalidated device data.  Last Pain:  Vitals:   07/06/22 0630  TempSrc: Oral  PainSc:          Complications: No notable events documented.

## 2022-07-06 NOTE — Anesthesia Preprocedure Evaluation (Signed)
Anesthesia Evaluation  Patient identified by MRN, date of birth, ID band Patient awake    Reviewed: Allergy & Precautions, NPO status , Patient's Chart, lab work & pertinent test results, reviewed documented beta blocker date and time   History of Anesthesia Complications Negative for: history of anesthetic complications  Airway Mallampati: I  TM Distance: >3 FB Neck ROM: Full    Dental no notable dental hx.    Pulmonary neg pulmonary ROS,    Pulmonary exam normal        Cardiovascular hypertension, Pt. on home beta blockers and Pt. on medications + CAD  negative cardio ROS Normal cardiovascular exam  TTE 2020: EF 55-60%, mild LVH, grade I DD, mildly reduced RV systolic function, moderate LAE, moderate MVP/MR, mild TR   Neuro/Psych negative neurological ROS  negative psych ROS   GI/Hepatic Neg liver ROS, GERD  Medicated and Controlled,  Endo/Other  Hypothyroidism   Renal/GU Renal Insufficiency and CRFRenal disease  negative genitourinary   Musculoskeletal negative musculoskeletal ROS (+) Osteoarthritis,    Abdominal   Peds negative pediatric ROS (+)  Hematology  (+) Blood dyscrasia, anemia ,   Anesthesia Other Findings   Reproductive/Obstetrics negative OB ROS                             Anesthesia Physical  Anesthesia Plan  ASA: 3  Anesthesia Plan: General   Post-op Pain Management: Dilaudid IV and Minimal or no pain anticipated   Induction: Intravenous  PONV Risk Score and Plan: 2 and Treatment may vary due to age or medical condition, Ondansetron and Midazolam  Airway Management Planned: LMA  Additional Equipment: None  Intra-op Plan:   Post-operative Plan: Extubation in OR  Informed Consent: I have reviewed the patients History and Physical, chart, labs and discussed the procedure including the risks, benefits and alternatives for the proposed anesthesia with the  patient or authorized representative who has indicated his/her understanding and acceptance.     Dental advisory given  Plan Discussed with: CRNA, Anesthesiologist and Surgeon  Anesthesia Plan Comments: ( )        Anesthesia Quick Evaluation

## 2022-07-06 NOTE — Anesthesia Procedure Notes (Signed)
Procedure Name: LMA Insertion Date/Time: 07/06/2022 8:01 AM  Performed by: Lind Covert, CRNAPre-anesthesia Checklist: Patient identified, Emergency Drugs available, Suction available, Patient being monitored and Timeout performed Patient Re-evaluated:Patient Re-evaluated prior to induction Oxygen Delivery Method: Circle system utilized Preoxygenation: Pre-oxygenation with 100% oxygen Induction Type: IV induction LMA: LMA inserted LMA Size: 5.0 Tube type: Oral Number of attempts: 1 Placement Confirmation: positive ETCO2 and breath sounds checked- equal and bilateral Tube secured with: Tape Dental Injury: Teeth and Oropharynx as per pre-operative assessment

## 2022-07-06 NOTE — Interval H&P Note (Signed)
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  The various methods of treatment have been discussed with the patient and family. After consideration of risks, benefits and other options for treatment, the patient has consented to  Procedure(s) with comments: CYSTOSCOPY WITH LITHOLAPAXY (N/A) TRANSURETHRAL RESECTION OF THE PROSTATE (TURP) (N/A) - 1 HR FOR CASE as a surgical intervention.  The patient's history has been reviewed, patient examined, no change in status, stable for surgery.  I have reviewed the patient's chart and labs.  Questions were answered to the patient's satisfaction.     Irine Seal

## 2022-07-06 NOTE — Op Note (Signed)
Procedure: 1.  Cystoscopy litholapaxy of 1 cm stone. 2.  Transurethral resection of the prostate.  Preop diagnosis: 1 cm bladder stone and BPH with bladder outlet obstruction.  Postop diagnosis: Same.  Surgeon: Dr. Irine Seal.  Anesthesia: General.  Specimen: 1.  Stone material which was given to family. 2.  Prostate chips.  Drains: 22 French three-way Foley catheter.  EBL: 50 mL.  Complications: None.  Indications: The patient is an 81 year old male who was seen in the office for voiding symptoms and found on cystoscopy to have a 1 cm bladder stone that could not pass and trilobar hyperplasia with obstruction.  He is elected undergo cystolitholapaxy and TURP.  Procedure: He was taken operating room was given 2 g of Ancef.  A general anesthetic was induced.  He was placed in lithotomy position and fitted with PAS hose.  His perineum and genitalia were prepped with Betadine solution he was draped in usual sterile fashion.  Initially the 21 French continuous-flow resectoscope sheath was inserted with the aid of visual obturator.  Inspection demonstrated a normal urethra.  The external sphincter was intact.  The prostatic urethra was approximately 3 to 4 cm in length with trilobar hyperplasia and obstruction.  Examination of bladder revealed mild trabeculation.  There was a 1 cm stone at the bladder base.  Ureteral orifices were unremarkable.  The resectoscope was removed and the 23 French cystoscope sheath was inserted with the 30 degree lens.  The manual lithotrite was then placed and the stone was fragmented sufficiently that it could be removed.  Once the stone had been removed the resectoscope was reinserted and fitted with an Beatrix Fetters handle with a bipolar loop and the 30 degree lens.  Saline was used as the irrigant.  The prostate was then resected with the middle lobe resected down to the bladder neck fibers and then the floor out to alongside the verumontanum.  The left lobe of  the prostate was resected from bladder neck to apex out to the capsular fibers.  The right lobe of the prostate was resected from bladder neck to apex out to the capsular fibers.  The bladder was evacuated free of chips and residual apical and anterior tissue was then resected.  Once an adequate resection had been performed, final chip removal and hemostasis was achieved.  Final inspection demonstrated intact ureteral orifice ease, no active bleeding or retained chips and an intact external sphincter.  The scope was removed and pressure on the bladder produced an excellent stream.  A 22 French three-way Foley catheter was inserted with the aid of a catheter guide.  The balloon was filled with 30 mL of sterile fluid.  The catheter was irrigated with clear return and placed to straight drainage and continuous irrigation with saline.  He was taken down from lithotomy position, his anesthetic was reversed and he was moved recovery in stable condition.  The chips were sent to pathology and the stone was given the patient's wife.  There were no complications.

## 2022-07-07 ENCOUNTER — Encounter (HOSPITAL_COMMUNITY): Payer: Self-pay | Admitting: Urology

## 2022-07-07 DIAGNOSIS — R350 Frequency of micturition: Secondary | ICD-10-CM | POA: Diagnosis not present

## 2022-07-07 DIAGNOSIS — Z79899 Other long term (current) drug therapy: Secondary | ICD-10-CM | POA: Diagnosis not present

## 2022-07-07 DIAGNOSIS — Z7982 Long term (current) use of aspirin: Secondary | ICD-10-CM | POA: Diagnosis not present

## 2022-07-07 DIAGNOSIS — I119 Hypertensive heart disease without heart failure: Secondary | ICD-10-CM | POA: Diagnosis not present

## 2022-07-07 DIAGNOSIS — I2510 Atherosclerotic heart disease of native coronary artery without angina pectoris: Secondary | ICD-10-CM | POA: Diagnosis not present

## 2022-07-07 DIAGNOSIS — N138 Other obstructive and reflux uropathy: Secondary | ICD-10-CM | POA: Diagnosis not present

## 2022-07-07 DIAGNOSIS — R351 Nocturia: Secondary | ICD-10-CM | POA: Diagnosis not present

## 2022-07-07 DIAGNOSIS — N401 Enlarged prostate with lower urinary tract symptoms: Secondary | ICD-10-CM | POA: Diagnosis not present

## 2022-07-07 DIAGNOSIS — Z96652 Presence of left artificial knee joint: Secondary | ICD-10-CM | POA: Diagnosis not present

## 2022-07-07 DIAGNOSIS — N210 Calculus in bladder: Secondary | ICD-10-CM | POA: Diagnosis not present

## 2022-07-07 DIAGNOSIS — Z96643 Presence of artificial hip joint, bilateral: Secondary | ICD-10-CM | POA: Diagnosis not present

## 2022-07-07 DIAGNOSIS — R310 Gross hematuria: Secondary | ICD-10-CM | POA: Diagnosis not present

## 2022-07-07 MED ORDER — CEPHALEXIN 500 MG PO CAPS: 500.0000 mg | ORAL_CAPSULE | Freq: Three times a day (TID) | ORAL | 0 refills | Status: AC

## 2022-07-07 MED ORDER — CHLORHEXIDINE GLUCONATE CLOTH 2 % EX PADS: 6.0000 | MEDICATED_PAD | Freq: Every day | CUTANEOUS | Status: DC

## 2022-07-07 MED ORDER — ASPIRIN 81 MG PO TBEC: 81.0000 mg | DELAYED_RELEASE_TABLET | Freq: Every day | ORAL | 12 refills | Status: AC

## 2022-07-07 NOTE — Progress Notes (Signed)
Foley catheter removed at 1200.  Patient reminded to use urinal next time he voids and to let nursing staff know.  Angie Fava, RN

## 2022-07-07 NOTE — Plan of Care (Signed)
  Problem: Clinical Measurements: Goal: Ability to maintain clinical measurements within normal limits will improve Outcome: Progressing Goal: Diagnostic test results will improve Outcome: Progressing Goal: Respiratory complications will improve Outcome: Progressing   

## 2022-07-07 NOTE — Progress Notes (Signed)
Patient voided 200 cc clear red urine.  MD notified.  Angie Fava, RN

## 2022-07-07 NOTE — Progress Notes (Signed)
Urology Inpatient Progress Report  BPH with obstruction/lower urinary tract symptoms [N40.1, N13.8]  Procedure(s): CYSTOSCOPY WITH LITHOLAPAXY TRANSURETHRAL RESECTION OF THE PROSTATE (TURP)  1 Day Post-Op   Intv/Subj: No acute events overnight. Patient is without complaint.  Principal Problem:   BPH with obstruction/lower urinary tract symptoms  Current Facility-Administered Medications  Medication Dose Route Frequency Provider Last Rate Last Admin   acetaminophen (TYLENOL) tablet 650 mg  650 mg Oral Q4H PRN Irine Seal, MD       allopurinol (ZYLOPRIM) tablet 300 mg  300 mg Oral Daily Irine Seal, MD   300 mg at 07/07/22 1146   bisacodyl (DULCOLAX) suppository 10 mg  10 mg Rectal Daily PRN Irine Seal, MD       ceFAZolin (ANCEF) IVPB 1 g/50 mL premix  1 g Intravenous Q8H Irine Seal, MD 100 mL/hr at 07/07/22 0606 1 g at 07/07/22 0606   Chlorhexidine Gluconate Cloth 2 % PADS 6 each  6 each Topical Daily Irine Seal, MD       ezetimibe (ZETIA) tablet 10 mg  10 mg Oral q morning Irine Seal, MD   10 mg at 07/07/22 1147   HYDROcodone-acetaminophen (NORCO/VICODIN) 5-325 MG per tablet 1-2 tablet  1-2 tablet Oral Q4H PRN Irine Seal, MD       HYDROmorphone (DILAUDID) injection 0.5-1 mg  0.5-1 mg Intravenous Q2H PRN Irine Seal, MD       hyoscyamine (LEVSIN SL) SL tablet 0.125 mg  0.125 mg Sublingual Q4H PRN Irine Seal, MD       levothyroxine (SYNTHROID) tablet 75 mcg  75 mcg Oral QAC breakfast Irine Seal, MD   75 mcg at 07/07/22 2878   metoprolol tartrate (LOPRESSOR) tablet 25 mg  25 mg Oral BID Irine Seal, MD   25 mg at 07/07/22 1147   nitroGLYCERIN (NITROSTAT) SL tablet 0.4 mg  0.4 mg Sublingual Q5 min PRN Irine Seal, MD       ondansetron [place]) injection 4 mg  4 mg Intravenous Q4H PRN Irine Seal, MD       pantoprazole (PROTONIX) EC tablet 40 mg  40 mg Oral Daily Irine Seal, MD   40 mg at 07/07/22 1146   rosuvastatin (CRESTOR) tablet 10 mg  10 mg Oral q morning Irine Seal, MD    10 mg at 07/07/22 1146   senna-docusate (Senokot-S) tablet 1 tablet  1 tablet Oral QHS PRN Irine Seal, MD       sodium phosphate (FLEET) 7-19 GM/118ML enema 1 enema  1 enema Rectal Once PRN Irine Seal, MD         Objective: Vital: Vitals:   07/06/22 1408 07/06/22 2033 07/07/22 0014 07/07/22 0409  BP: (!) 145/72 136/64 117/64 111/62  Pulse: (!) 56 68 66 63  Resp: '20 16 15 17  '$ Temp: 97.9 F (36.6 C) 98.2 F (36.8 C) 99 F (37.2 C) 98.6 F (37 C)  TempSrc:  Oral Oral Oral  SpO2:  100% 97% 97%  Weight:      Height:       I/Os: I/O last 3 completed shifts: In: 26310.8 [P.O.:960; I.V.:2425.8; MVEHM:09470; IV Piggyback:100] Out: 1500 [Urine:1500]  Physical Exam:  General: Patient is in no apparent distress Lungs: Normal respiratory effort, chest expands symmetrically. GI: The abdomen is soft and nontender without mass. Foley: blood tinged urine on slow CBI  Ext: lower extremities symmetric  Lab Results: No results for input(s): "WBC", "HGB", "HCT" in the last 72 hours. No results for input(s): "NA", "K", "CL", "CO2", "  GLUCOSE", "BUN", "CREATININE", "CALCIUM" in the last 72 hours. No results for input(s): "LABPT", "INR" in the last 72 hours. No results for input(s): "LABURIN" in the last 72 hours. Results for orders placed or performed during the hospital encounter of 04/24/22  Surgical pcr screen     Status: Abnormal   Collection Time: 04/24/22  1:12 PM   Specimen: Nasal Mucosa; Nasal Swab  Result Value Ref Range Status   MRSA, PCR NEGATIVE NEGATIVE Final   Staphylococcus aureus POSITIVE (A) NEGATIVE Final    Comment: (NOTE) The Xpert SA Assay (FDA approved for NASAL specimens in patients 71 years of age and older), is one component of a comprehensive surveillance program. It is not intended to diagnose infection nor to guide or monitor treatment. Performed at [facility], [place] [address], [place], [place] [address]     Studies/Results: No  results found.  Assessment: Procedure(s): CYSTOSCOPY WITH LITHOLAPAXY TRANSURETHRAL RESECTION OF THE PROSTATE (TURP), 1 Day Post-Op  doing well.  Plan: Voiding trial Expect he'll be ready for discharge this PM   Louis Meckel, MD Urology 07/07/2022, 11:55 AM

## 2022-07-07 NOTE — Discharge Instructions (Signed)

## 2022-07-07 NOTE — Discharge Summary (Signed)
Date of admission: 07/06/2022  Date of discharge: 07/07/2022  Admission diagnosis: BPH with obstructive voiding symptoms, bladder calculi  Discharge diagnosis: Same  Secondary diagnoses:  Patient Active Problem List   Diagnosis Date Noted   BPH with obstruction/lower urinary tract symptoms 07/06/2022   S/P total right hip arthroplasty 04/24/2022   Osteoarthritis of left knee 02/22/2021   Status post total left knee replacement 02/21/2021   Hyperlipidemia 02/25/2017   Abnormal stress test    Dyspnea on exertion    Expected blood loss anemia 01/28/2013   Overweight (BMI 25.0-29.9) 01/28/2013   S/P left THA, AA 01/27/2013   CAD (coronary artery disease) 07/05/2011   HTN (hypertension) 07/05/2011    Procedures performed: Procedure(s): CYSTOSCOPY WITH LITHOLAPAXY TRANSURETHRAL RESECTION OF THE PROSTATE (TURP)  History and Physical: For full details, please see admission history and physical. Briefly, William Pierce is a 81 y.o. year old patient with history of obstructive voiding symptoms, microscopic hematuria, and bladder calculi.   Hospital Course: Patient tolerated the procedure well.  He was then transferred to the floor after an uneventful PACU stay.  His hospital course was uncomplicated.  On POD#1 he had met discharge criteria: was eating a regular diet, was up and ambulating independently,  pain was well controlled, was voiding without a catheter, and was ready to for discharge.    The patient passes voiding trial, had blood-tinged urine, and was deemed safe for discharge.   Laboratory values:  No results for input(s): "WBC", "HGB", "HCT" in the last 72 hours. No results for input(s): "NA", "K", "CL", "CO2", "GLUCOSE", "BUN", "CREATININE", "CALCIUM" in the last 72 hours. No results for input(s): "LABPT", "INR" in the last 72 hours. No results for input(s): "LABURIN" in the last 72 hours. Results for orders placed or performed during the hospital encounter of 04/24/22   Surgical pcr screen     Status: Abnormal   Collection Time: 04/24/22  1:12 PM   Specimen: Nasal Mucosa; Nasal Swab  Result Value Ref Range Status   MRSA, PCR NEGATIVE NEGATIVE Final   Staphylococcus aureus POSITIVE (A) NEGATIVE Final    Comment: (NOTE) The Xpert SA Assay (FDA approved for NASAL specimens in patients 4 years of age and older), is one component of a comprehensive surveillance program. It is not intended to diagnose infection nor to guide or monitor treatment. Performed at [facility], [place] [address], [place], [place] [address]     Disposition: Home  Discharge instruction: The patient was instructed to be ambulatory but told to refrain from heavy lifting, strenuous activity, or driving.   Discharge medications:  Allergies as of 07/07/2022   No Known Allergies      Medication List     STOP taking these medications    methocarbamol 500 MG tablet Commonly known as: ROBAXIN   sildenafil 100 MG tablet Commonly known as: VIAGRA       TAKE these medications    allopurinol 300 MG tablet Commonly known as: ZYLOPRIM Take 300 mg by mouth daily.   aspirin EC 81 MG tablet Take 1 tablet (81 mg total) by mouth at bedtime. Recommend holding this until all the blood in your urine has cleared. What changed: additional instructions   cephALEXin 500 MG capsule Commonly known as: KEFLEX Take 1 capsule (500 mg total) by mouth 3 (three) times daily for 3 days.   cyanocobalamin 1000 MCG tablet Commonly known as: VITAMIN B12 Take 1,000 mcg by mouth daily.   docusate sodium 100 MG  capsule Commonly known as: COLACE Take 1 capsule (100 mg total) by mouth 2 (two) times daily.   ezetimibe 10 MG tablet Commonly known as: ZETIA Take 10 mg by mouth every morning.   Fish Oil 1000 MG Caps Take 1,000 mg by mouth in the morning and at bedtime.   HYDROcodone-acetaminophen 5-325 MG tablet Commonly known as: NORCO/VICODIN Take 1 tablet by mouth every  4 (four) hours as needed for severe pain.   levothyroxine 75 MCG tablet Commonly known as: SYNTHROID Take 75 mcg by mouth daily before breakfast.   metoprolol tartrate 50 MG tablet Commonly known as: LOPRESSOR Take 25 mg by mouth 2 (two) times daily.   nitroGLYCERIN 0.4 MG SL tablet Commonly known as: NITROSTAT Place 0.4 mg under the tongue every 5 (five) minutes as needed for chest pain.   omeprazole 20 MG capsule Commonly known as: PRILOSEC Take 20 mg by mouth every Monday, Wednesday, and Friday.   rosuvastatin 10 MG tablet Commonly known as: CRESTOR Take 10 mg by mouth every morning.   Vitamin D3 25 MCG (1000 UT) Caps Take 1,000 Units by mouth daily.        Followup:   Follow-up Information     [facility] Follow up on 07/06/2022.   Why: As scheduled Contact information: [address]        Karen Kays, NP Follow up on 07/20/2022.   Specialty: Nurse Practitioner Why: 10am Contact information: [address] [place] [address] [phone]

## 2022-07-07 NOTE — Plan of Care (Signed)
  Problem: Education: Goal: Knowledge of General Education information will improve Description: Including pain rating scale, medication(s)/side effects and non-pharmacologic comfort measures Outcome: Adequate for Discharge   Problem: Health Behavior/Discharge Planning: Goal: Ability to manage health-related needs will improve Outcome: Adequate for Discharge   Problem: Clinical Measurements: Goal: Ability to maintain clinical measurements within normal limits will improve Outcome: Adequate for Discharge Goal: Will remain free from infection Outcome: Adequate for Discharge Goal: Diagnostic test results will improve Outcome: Adequate for Discharge Goal: Respiratory complications will improve Outcome: Adequate for Discharge Goal: Cardiovascular complication will be avoided Outcome: Adequate for Discharge   Problem: Activity: Goal: Risk for activity intolerance will decrease Outcome: Adequate for Discharge   Problem: Nutrition: Goal: Adequate nutrition will be maintained Outcome: Adequate for Discharge   Problem: Coping: Goal: Level of anxiety will decrease Outcome: Adequate for Discharge   Problem: Elimination: Goal: Will not experience complications related to bowel motility Outcome: Adequate for Discharge Goal: Will not experience complications related to urinary retention Outcome: Adequate for Discharge   Problem: Pain Managment: Goal: General experience of comfort will improve Outcome: Adequate for Discharge   Problem: Safety: Goal: Ability to remain free from injury will improve Outcome: Adequate for Discharge   Problem: Skin Integrity: Goal: Risk for impaired skin integrity will decrease Outcome: Adequate for Discharge   Problem: Education: Goal: Knowledge of the prescribed therapeutic regimen will improve Outcome: Adequate for Discharge   Problem: Bowel/Gastric: Goal: Gastrointestinal status for postoperative course will improve Outcome: Adequate for  Discharge   Problem: Health Behavior/Discharge Planning: Goal: Identification of resources available to assist in meeting health care needs will improve Outcome: Adequate for Discharge   Problem: Skin Integrity: Goal: Demonstration of wound healing without infection will improve Outcome: Adequate for Discharge   Problem: Urinary Elimination: Goal: Ability to avoid or minimize complications of infection will improve Outcome: Adequate for Discharge

## 2022-07-09 LAB — SURGICAL PATHOLOGY

## 2022-07-20 DIAGNOSIS — R310 Gross hematuria: Secondary | ICD-10-CM | POA: Diagnosis not present

## 2022-08-06 ENCOUNTER — Ambulatory Visit: Payer: Self-pay | Admitting: Neurology

## 2022-08-08 DIAGNOSIS — M25552 Pain in left hip: Secondary | ICD-10-CM | POA: Diagnosis not present

## 2022-08-15 DIAGNOSIS — M25552 Pain in left hip: Secondary | ICD-10-CM | POA: Diagnosis not present

## 2022-08-20 DIAGNOSIS — M25552 Pain in left hip: Secondary | ICD-10-CM | POA: Diagnosis not present

## 2022-08-23 DIAGNOSIS — M25552 Pain in left hip: Secondary | ICD-10-CM | POA: Diagnosis not present

## 2022-08-28 DIAGNOSIS — M25552 Pain in left hip: Secondary | ICD-10-CM | POA: Diagnosis not present

## 2022-08-30 DIAGNOSIS — M25552 Pain in left hip: Secondary | ICD-10-CM | POA: Diagnosis not present

## 2022-09-03 DIAGNOSIS — M25552 Pain in left hip: Secondary | ICD-10-CM | POA: Diagnosis not present

## 2022-09-06 DIAGNOSIS — M25552 Pain in left hip: Secondary | ICD-10-CM | POA: Diagnosis not present

## 2022-10-10 ENCOUNTER — Ambulatory Visit (HOSPITAL_COMMUNITY): Payer: Medicare HMO | Attending: Cardiovascular Disease

## 2022-10-10 DIAGNOSIS — I340 Nonrheumatic mitral (valve) insufficiency: Secondary | ICD-10-CM | POA: Insufficient documentation

## 2022-10-10 DIAGNOSIS — I2510 Atherosclerotic heart disease of native coronary artery without angina pectoris: Secondary | ICD-10-CM | POA: Diagnosis not present

## 2022-10-10 LAB — ECHOCARDIOGRAM COMPLETE
Area-P 1/2: 2.5 cm2
P 1/2 time: 779 msec
S' Lateral: 3.1 cm

## 2022-10-17 DIAGNOSIS — Z23 Encounter for immunization: Secondary | ICD-10-CM | POA: Diagnosis not present

## 2022-10-22 DIAGNOSIS — R311 Benign essential microscopic hematuria: Secondary | ICD-10-CM | POA: Diagnosis not present

## 2022-10-22 DIAGNOSIS — N5201 Erectile dysfunction due to arterial insufficiency: Secondary | ICD-10-CM | POA: Diagnosis not present

## 2022-10-22 DIAGNOSIS — R3915 Urgency of urination: Secondary | ICD-10-CM | POA: Diagnosis not present

## 2022-10-22 DIAGNOSIS — N401 Enlarged prostate with lower urinary tract symptoms: Secondary | ICD-10-CM | POA: Diagnosis not present

## 2022-10-22 DIAGNOSIS — R8279 Other abnormal findings on microbiological examination of urine: Secondary | ICD-10-CM | POA: Diagnosis not present

## 2022-10-24 DIAGNOSIS — M1711 Unilateral primary osteoarthritis, right knee: Secondary | ICD-10-CM | POA: Diagnosis not present

## 2022-10-25 ENCOUNTER — Telehealth: Payer: Self-pay | Admitting: Cardiovascular Disease

## 2022-10-25 DIAGNOSIS — D225 Melanocytic nevi of trunk: Secondary | ICD-10-CM | POA: Diagnosis not present

## 2022-10-25 DIAGNOSIS — C44622 Squamous cell carcinoma of skin of right upper limb, including shoulder: Secondary | ICD-10-CM | POA: Diagnosis not present

## 2022-10-25 DIAGNOSIS — D2261 Melanocytic nevi of right upper limb, including shoulder: Secondary | ICD-10-CM | POA: Diagnosis not present

## 2022-10-25 DIAGNOSIS — L570 Actinic keratosis: Secondary | ICD-10-CM | POA: Diagnosis not present

## 2022-10-25 DIAGNOSIS — L821 Other seborrheic keratosis: Secondary | ICD-10-CM | POA: Diagnosis not present

## 2022-10-25 DIAGNOSIS — D485 Neoplasm of uncertain behavior of skin: Secondary | ICD-10-CM | POA: Diagnosis not present

## 2022-10-25 DIAGNOSIS — L720 Epidermal cyst: Secondary | ICD-10-CM | POA: Diagnosis not present

## 2022-10-25 DIAGNOSIS — Z85828 Personal history of other malignant neoplasm of skin: Secondary | ICD-10-CM | POA: Diagnosis not present

## 2022-10-25 NOTE — Telephone Encounter (Signed)
Patient returned RN's call to follow-up on test results.

## 2022-10-25 NOTE — Telephone Encounter (Signed)
Reviewed Echocardiogram results and MD recommendations.  Advised pt will send message to Primary RN to schedule OV.  I did not see any appointments available in the next couple of months.  RN may be able to schedule pt sooner.

## 2022-10-26 NOTE — Telephone Encounter (Signed)
Called patient and scheduled ov with Dr. Angelena Form 11/05/22.

## 2022-11-05 ENCOUNTER — Encounter: Payer: Self-pay | Admitting: Cardiovascular Disease

## 2022-11-05 ENCOUNTER — Ambulatory Visit: Payer: Medicare HMO | Attending: Cardiovascular Disease | Admitting: Cardiovascular Disease

## 2022-11-05 VITALS — BP 138/60 | HR 60 | Ht 69.5 in | Wt 175.0 lb

## 2022-11-05 DIAGNOSIS — E7800 Pure hypercholesterolemia, unspecified: Secondary | ICD-10-CM

## 2022-11-05 DIAGNOSIS — I2510 Atherosclerotic heart disease of native coronary artery without angina pectoris: Secondary | ICD-10-CM | POA: Diagnosis not present

## 2022-11-05 DIAGNOSIS — I340 Nonrheumatic mitral (valve) insufficiency: Secondary | ICD-10-CM

## 2022-11-05 NOTE — Progress Notes (Signed)
Chief Complaint  Patient presents with   Follow-up    Mitral insuff    History of Present Illness: 81 yo male with history of CAD s/p 3V CABG 2005, HLD, mitral regurgitation, aortic regurgitation, GERD and ED here today cardiac follow up. His CABG was in September 2005. Echo 12/20/16 with normal LV systolic function, mild AI, moderate MR. Nuclear stress test January 2018 with possible inferior wall ischemia, EKG changes with exercise. This was arranged due to dyspnea. Cardiac cath February 2018 with three vessel CAD with 3/3 patent bypass grafts. Echo November 2022 with LVEF=55-60%. Mild RV dysfunction. Mitral valve prolapse with mild to moderate MR. Trivial aortic valve insufficiency. Most recent echo 10/10/22 with LVEF=60-65%, moderate to severe MR with bileaflet prolapse.   He is here today for follow up. The patient denies any chest pain, dyspnea, palpitations, lower extremity edema, orthopnea, PND, dizziness, near syncope or syncope.   Primary Care Physician: Prince Solian, MD  Past Medical History:  Diagnosis Date   Allergic rhinitis    Aortic insufficiency    Arthritis    oa   Benign prostatic hypertrophy    Bladder stone    hematuria 04-17-22  saw urology bladder stone found will remove post hip surgery   Chronic kidney disease, stage 3 ([place])    Coronary artery disease    a. s/p 3V CABG 2005.   Erectile dysfunction    GERD (gastroesophageal reflux disease)    Gout    History of kidney stones    Hyperlipemia    Hypertension    Hypothyroidism    Mitral regurgitation    Mitral valve prolapse    Neuromuscular disorder (HCC)    mild neuropathy   Plantar fasciitis    Tubular adenoma of colon     Past Surgical History:  Procedure Laterality Date   CARDIAC CATHETERIZATION N/A 01/10/2017   Procedure: Left Heart Cath and Cors/Grafts Angiography;  Surgeon: Burnell Blanks, MD;  Location: [facility];  Service: Cardiovascular;  Laterality: N/A;   CORONARY ARTERY  BYPASS GRAFT  LIMA to LAD, SVG to Intermediate, SVG to PDA   2005 cabg x 3   CYSTOSCOPY WITH LITHOLAPAXY N/A 07/06/2022   Procedure: CYSTOSCOPY WITH LITHOLAPAXY;  Surgeon: Irine Seal, MD;  Location: [facility];  Service: Urology;  Laterality: N/A;   Hernia surgery x 2     TOTAL HIP ARTHROPLASTY Left 01/27/2013   Procedure: TOTAL HIP ARTHROPLASTY ANTERIOR APPROACH;  Surgeon: Mauri Pole, MD;  Location: [facility];  Service: Orthopedics;  Laterality: Left;   TOTAL HIP ARTHROPLASTY Right 04/24/2022   Procedure: TOTAL HIP ARTHROPLASTY ANTERIOR APPROACH;  Surgeon: Paralee Cancel, MD;  Location: [facility];  Service: Orthopedics;  Laterality: Right;   TOTAL KNEE ARTHROPLASTY Left 02/21/2021   Procedure: TOTAL KNEE ARTHROPLASTY;  Surgeon: Paralee Cancel, MD;  Location: [facility];  Service: Orthopedics;  Laterality: Left;  70 mins   TRANSURETHRAL RESECTION OF PROSTATE N/A 07/06/2022   Procedure: TRANSURETHRAL RESECTION OF THE PROSTATE (TURP);  Surgeon: Irine Seal, MD;  Location: [facility];  Service: Urology;  Laterality: N/A;  1 HR FOR CASE    Current Outpatient Medications  Medication Sig Dispense Refill   allopurinol (ZYLOPRIM) 300 MG tablet Take 300 mg by mouth daily.     aspirin EC 81 MG tablet Take 1 tablet (81 mg total) by mouth at bedtime. Recommend holding this until all the blood in your urine has cleared. 30 tablet 12   Cholecalciferol (VITAMIN D3) 1000 units  CAPS Take 1,000 Units by mouth daily.     ezetimibe (ZETIA) 10 MG tablet Take 10 mg by mouth every morning.     levothyroxine (SYNTHROID) 75 MCG tablet Take 75 mcg by mouth daily before breakfast.     metoprolol (LOPRESSOR) 50 MG tablet Take 25 mg by mouth 2 (two) times daily.      nitroGLYCERIN (NITROSTAT) 0.4 MG SL tablet Place 0.4 mg under the tongue every 5 (five) minutes as needed for chest pain.     Omega-3 Fatty Acids (FISH OIL) 1000 MG CAPS Take 1,000 mg by mouth in the morning and at bedtime.     omeprazole (PRILOSEC) 20 MG capsule Take 20 mg by  mouth every Monday, Wednesday, and Friday.     rosuvastatin (CRESTOR) 10 MG tablet Take 10 mg by mouth every morning.     vitamin B-12 (CYANOCOBALAMIN) 1000 MCG tablet Take 1,000 mcg by mouth daily.     No current facility-administered medications for this visit.    No Known Allergies  Social History   Socioeconomic History   Marital status: Married    Spouse name: Not on file   Number of children: Not on file   Years of education: Not on file   Highest education level: Not on file  Occupational History   Occupation: Retired-banker  Tobacco Use   Smoking status: Never   Smokeless tobacco: Never  Vaping Use   Vaping Use: Never used  Substance and Sexual Activity   Alcohol use: Yes    Comment: social   Drug use: No   Sexual activity: Not on file  Other Topics Concern   Not on file  Social History Narrative    Positive for coronary disease in both his father as     well as a stroke in his mother.         Social Determinants of Health   Financial Resource Strain: Not on file  Food Insecurity: Not on file  Transportation Needs: Not on file  Physical Activity: Not on file  Stress: Not on file  Social Connections: Not on file  Intimate Partner Violence: Not on file    Family History  Problem Relation Age of Onset   Heart attack Father    Coronary artery disease Other     Review of Systems:  As stated in the HPI and otherwise negative.   BP 138/60   Pulse 60   Ht 5' 9.5" (1.765 m)   Wt 175 lb (79.4 kg)   SpO2 96%   BMI 25.47 kg/m   Physical Examination: General: Well developed, well nourished, NAD  HEENT: OP clear, mucus membranes moist  SKIN: warm, dry. No rashes. Neuro: No focal deficits  Musculoskeletal: Muscle strength 5/5 all ext  Psychiatric: Mood and affect normal  Neck: No JVD, no carotid bruits, no thyromegaly, no lymphadenopathy.  Lungs:Clear bilaterally, no wheezes, rhonci, crackles Cardiovascular: Regular rate and rhythm. Systolic murmur.   Abdomen:Soft. Bowel sounds present. Non-tender.  Extremities: No lower extremity edema. Pulses are 2 + in the bilateral DP/PT.  Echo November 2023:   1. Myxomatous mitral valve with bileaflet prolapse. Moderate to severe MR  is present. Concerns for systolic reversal of flow in right inferior  pulmonary vein. LA size is normal. If there are clinical concerns for  severe MR, would recommend a TEE for  clarification. The mitral valve is myxomatous. Moderate to severe mitral  valve regurgitation. No evidence of mitral stenosis. There is moderate  holosystolic prolapse of  both leaflets of the mitral valve.   2. Left ventricular ejection fraction, by estimation, is 60 to 65%. The  left ventricle has normal function. The left ventricle has no regional  wall motion abnormalities. There is moderate asymmetric left ventricular  hypertrophy of the basal-septal  segment. Left ventricular diastolic parameters were normal.   3. Right ventricular systolic function is normal. The right ventricular  size is normal. There is normal pulmonary artery systolic pressure. The  estimated right ventricular systolic pressure is 65.7 mmHg.   4. The aortic valve is tricuspid. Aortic valve regurgitation is mild. No  aortic stenosis is present.   5. The inferior vena cava is normal in size with greater than 50%  respiratory variability, suggesting right atrial pressure of 3 mmHg.   Cardiac cath February 2018:  Left Main  Ost LM lesion, 70% stenosed.  Left Anterior Descending  Vessel is large.  Mid LAD lesion, 50% stenosed.  First Septal Branch  Vessel is small in size.  Second Diagonal Branch  Vessel is moderate in size.  Second Septal Branch  Vessel is small in size.  Ramus Intermedius  Vessel is large.  Ost Ramus to Ramus lesion, 100% stenosed.  Left Circumflex  Vessel is moderate in size.  First Obtuse Marginal Branch  Vessel is small in size.  Second Obtuse Marginal Branch  Vessel is small in  size.  Third Obtuse Marginal Branch  Vessel is small in size.  Right Coronary Artery  Mid RCA lesion, 70% stenosed.  Graft Angiography  saphenous Graft to RPDA  SVG graft was visualized by angiography and is normal in caliber and anatomically normal.  saphenous Graft to Ramus  SVG due to known occlusion and is normal in caliber and anatomically normal.  Free LIMA Graft to Dist LAD  LIMA graft was visualized by angiography and is normal in caliber and anatomically normal.  Coronary Diagrams   Diagnostic Diagram          EKG:  EKG is not ordered today.  The ekg ordered today demonstrates   Recent Labs: 04/25/2022: Hemoglobin 12.9; Platelets 351 06/26/2022: BUN 20; Creatinine, Ser 0.92; Potassium 3.7; Sodium 139   Lipid Panel No results found for: "CHOL", "TRIG", "HDL", "CHOLHDL", "VLDL", "LDLCALC", "LDLDIRECT"   Wt Readings from Last 3 Encounters:  11/05/22 175 lb (79.4 kg)  07/06/22 167 lb 5.3 oz (75.9 kg)  06/26/22 167 lb 6.4 oz (75.9 kg)     Assessment and Plan:   1. CAD s/p CABG without angina: He has no chest pain. Cardiac cath February 2018 with three vessel CAD with 3/3 patent bypass grafts. Will continue ASA, beta blocker and statin.   2. Hyperlipidemia: Lipids followed in primary care. LDL 67 in January 2023. Continue statin. .   3. Mitral regurgitation: Echo November 2023 with moderate MR. He is asymptomatic. Repeat echo November 2024  Labs/ tests ordered today include:   Orders Placed This Encounter  Procedures   ECHOCARDIOGRAM COMPLETE   Disposition:   FU with me in 12 months   Signed, Lauree Chandler, MD 11/05/2022 3:33 PM    [facility] [address], [place], [place]  [address] Phone: [phone]; Fax: [phone]

## 2022-11-05 NOTE — Patient Instructions (Signed)
Medication Instructions:  No changes *If you need a refill on your cardiac medications before your next appointment, please call your pharmacy*   Lab Work: none If you have labs (blood work) drawn today and your tests are completely normal, you will receive your results only by: [facility] (if you have MyChart) OR A paper copy in the mail If you have any lab test that is abnormal or we need to change your treatment, we will call you to review the results.   Testing/Procedures: Echo due in ONE YEAR.   Follow-Up: ABOUT A WEEK AFTER ECHO At [facility], you and your health needs are our priority.  As part of our continuing mission to provide you with exceptional heart care, we have created designated Provider Care Teams.  These Care Teams include your primary Cardiologist (physician) and Advanced Practice Providers (APPs -  Physician Assistants and Nurse Practitioners) who all work together to provide you with the care you need, when you need it.  We recommend signing up for the patient portal called "MyChart".  Sign up information is provided on this After Visit Summary.  MyChart is used to connect with patients for Virtual Visits (Telemedicine).  Patients are able to view lab/test results, encounter notes, upcoming appointments, etc.  Non-urgent messages can be sent to your provider as well.   To learn more about what you can do with MyChart, go to [email or web address].    Your next appointment:   12 month(s)  The format for your next appointment:   In Person  Provider:   Lauree Chandler, MD      Important Information About Sugar

## 2022-11-19 DIAGNOSIS — C44529 Squamous cell carcinoma of skin of other part of trunk: Secondary | ICD-10-CM | POA: Diagnosis not present

## 2022-11-28 DIAGNOSIS — H43813 Vitreous degeneration, bilateral: Secondary | ICD-10-CM | POA: Diagnosis not present

## 2022-11-28 DIAGNOSIS — H2511 Age-related nuclear cataract, right eye: Secondary | ICD-10-CM | POA: Diagnosis not present

## 2022-11-28 DIAGNOSIS — H0102B Squamous blepharitis left eye, upper and lower eyelids: Secondary | ICD-10-CM | POA: Diagnosis not present

## 2022-11-28 DIAGNOSIS — H0102A Squamous blepharitis right eye, upper and lower eyelids: Secondary | ICD-10-CM | POA: Diagnosis not present

## 2022-11-28 DIAGNOSIS — H02831 Dermatochalasis of right upper eyelid: Secondary | ICD-10-CM | POA: Diagnosis not present

## 2022-11-28 DIAGNOSIS — H02834 Dermatochalasis of left upper eyelid: Secondary | ICD-10-CM | POA: Diagnosis not present

## 2023-01-01 DIAGNOSIS — H21561 Pupillary abnormality, right eye: Secondary | ICD-10-CM | POA: Diagnosis not present

## 2023-01-01 DIAGNOSIS — H2511 Age-related nuclear cataract, right eye: Secondary | ICD-10-CM | POA: Diagnosis not present

## 2023-01-01 DIAGNOSIS — H5703 Miosis: Secondary | ICD-10-CM | POA: Diagnosis not present

## 2023-01-14 DIAGNOSIS — K219 Gastro-esophageal reflux disease without esophagitis: Secondary | ICD-10-CM | POA: Diagnosis not present

## 2023-01-14 DIAGNOSIS — E039 Hypothyroidism, unspecified: Secondary | ICD-10-CM | POA: Diagnosis not present

## 2023-01-14 DIAGNOSIS — Z125 Encounter for screening for malignant neoplasm of prostate: Secondary | ICD-10-CM | POA: Diagnosis not present

## 2023-01-14 DIAGNOSIS — I2510 Atherosclerotic heart disease of native coronary artery without angina pectoris: Secondary | ICD-10-CM | POA: Diagnosis not present

## 2023-01-14 DIAGNOSIS — E785 Hyperlipidemia, unspecified: Secondary | ICD-10-CM | POA: Diagnosis not present

## 2023-01-14 DIAGNOSIS — R7989 Other specified abnormal findings of blood chemistry: Secondary | ICD-10-CM | POA: Diagnosis not present

## 2023-01-19 DIAGNOSIS — Z1212 Encounter for screening for malignant neoplasm of rectum: Secondary | ICD-10-CM | POA: Diagnosis not present

## 2023-01-20 DIAGNOSIS — H2512 Age-related nuclear cataract, left eye: Secondary | ICD-10-CM | POA: Diagnosis not present

## 2023-01-21 DIAGNOSIS — E039 Hypothyroidism, unspecified: Secondary | ICD-10-CM | POA: Diagnosis not present

## 2023-01-21 DIAGNOSIS — K219 Gastro-esophageal reflux disease without esophagitis: Secondary | ICD-10-CM | POA: Diagnosis not present

## 2023-01-21 DIAGNOSIS — M1990 Unspecified osteoarthritis, unspecified site: Secondary | ICD-10-CM | POA: Diagnosis not present

## 2023-01-21 DIAGNOSIS — E785 Hyperlipidemia, unspecified: Secondary | ICD-10-CM | POA: Diagnosis not present

## 2023-01-21 DIAGNOSIS — I059 Rheumatic mitral valve disease, unspecified: Secondary | ICD-10-CM | POA: Diagnosis not present

## 2023-01-21 DIAGNOSIS — M109 Gout, unspecified: Secondary | ICD-10-CM | POA: Diagnosis not present

## 2023-01-21 DIAGNOSIS — R82998 Other abnormal findings in urine: Secondary | ICD-10-CM | POA: Diagnosis not present

## 2023-01-21 DIAGNOSIS — I2510 Atherosclerotic heart disease of native coronary artery without angina pectoris: Secondary | ICD-10-CM | POA: Diagnosis not present

## 2023-01-21 DIAGNOSIS — N182 Chronic kidney disease, stage 2 (mild): Secondary | ICD-10-CM | POA: Diagnosis not present

## 2023-01-21 DIAGNOSIS — Z1339 Encounter for screening examination for other mental health and behavioral disorders: Secondary | ICD-10-CM | POA: Diagnosis not present

## 2023-01-21 DIAGNOSIS — Z0000 Encounter for general adult medical examination without abnormal findings: Secondary | ICD-10-CM | POA: Diagnosis not present

## 2023-01-21 DIAGNOSIS — Z1331 Encounter for screening for depression: Secondary | ICD-10-CM | POA: Diagnosis not present

## 2023-01-21 DIAGNOSIS — I129 Hypertensive chronic kidney disease with stage 1 through stage 4 chronic kidney disease, or unspecified chronic kidney disease: Secondary | ICD-10-CM | POA: Diagnosis not present

## 2023-01-24 DIAGNOSIS — H2512 Age-related nuclear cataract, left eye: Secondary | ICD-10-CM | POA: Diagnosis not present

## 2023-01-24 DIAGNOSIS — H5703 Miosis: Secondary | ICD-10-CM | POA: Diagnosis not present

## 2023-02-28 DIAGNOSIS — Z85828 Personal history of other malignant neoplasm of skin: Secondary | ICD-10-CM | POA: Diagnosis not present

## 2023-02-28 DIAGNOSIS — L821 Other seborrheic keratosis: Secondary | ICD-10-CM | POA: Diagnosis not present

## 2023-02-28 DIAGNOSIS — L570 Actinic keratosis: Secondary | ICD-10-CM | POA: Diagnosis not present

## 2023-04-29 DIAGNOSIS — N401 Enlarged prostate with lower urinary tract symptoms: Secondary | ICD-10-CM | POA: Diagnosis not present

## 2023-04-29 DIAGNOSIS — R350 Frequency of micturition: Secondary | ICD-10-CM | POA: Diagnosis not present

## 2023-04-29 DIAGNOSIS — R351 Nocturia: Secondary | ICD-10-CM | POA: Diagnosis not present

## 2023-10-16 DIAGNOSIS — H0102B Squamous blepharitis left eye, upper and lower eyelids: Secondary | ICD-10-CM | POA: Diagnosis not present

## 2023-10-16 DIAGNOSIS — H43813 Vitreous degeneration, bilateral: Secondary | ICD-10-CM | POA: Diagnosis not present

## 2023-10-16 DIAGNOSIS — Z961 Presence of intraocular lens: Secondary | ICD-10-CM | POA: Diagnosis not present

## 2023-10-16 DIAGNOSIS — H02831 Dermatochalasis of right upper eyelid: Secondary | ICD-10-CM | POA: Diagnosis not present

## 2023-10-16 DIAGNOSIS — H02834 Dermatochalasis of left upper eyelid: Secondary | ICD-10-CM | POA: Diagnosis not present

## 2023-10-16 DIAGNOSIS — H0102A Squamous blepharitis right eye, upper and lower eyelids: Secondary | ICD-10-CM | POA: Diagnosis not present

## 2023-11-06 ENCOUNTER — Ambulatory Visit (HOSPITAL_COMMUNITY): Payer: Medicare HMO | Attending: Cardiovascular Disease

## 2023-11-06 DIAGNOSIS — I340 Nonrheumatic mitral (valve) insufficiency: Secondary | ICD-10-CM | POA: Insufficient documentation

## 2023-11-06 LAB — ECHOCARDIOGRAM COMPLETE
Area-P 1/2: 2.91 cm2
Est EF: 55
P 1/2 time: 754 ms
S' Lateral: 3.4 cm

## 2023-11-19 NOTE — Progress Notes (Unsigned)
No chief complaint on file.  History of Present Illness: 82 yo male with history of CAD s/p 3V CABG 2005, HLD, mitral regurgitation, aortic regurgitation, GERD and ED here today cardiac follow up. His CABG was in September 2005. Echo 12/20/16 with normal LV systolic function, mild AI, moderate MR. Nuclear stress test January 2018 with possible inferior wall ischemia, EKG changes with exercise. This was arranged due to dyspnea. Cardiac cath February 2018 with three vessel CAD with 3/3 patent bypass grafts. Echo November 2022 with LVEF=55-60%. Mild RV dysfunction. Mitral valve prolapse with mild to moderate MR. Trivial aortic valve insufficiency. Echo November 2024 with LVEF=55%. Mild to moderate MR.    He is here today for follow up. The patient denies any chest pain, dyspnea, palpitations, lower extremity edema, orthopnea, PND, dizziness, near syncope or syncope.   Primary Care Physician: Chilton Greathouse, MD  Past Medical History:  Diagnosis Date   Allergic rhinitis    Aortic insufficiency    Arthritis    oa   Benign prostatic hypertrophy    Bladder stone    hematuria 04-17-22  saw urology bladder stone found will remove post hip surgery   Chronic kidney disease, stage 3 (HCC)    Coronary artery disease    a. s/p 3V CABG 2005.   Erectile dysfunction    GERD (gastroesophageal reflux disease)    Gout    History of kidney stones    Hyperlipemia    Hypertension    Hypothyroidism    Mitral regurgitation    Mitral valve prolapse    Neuromuscular disorder (HCC)    mild neuropathy   Plantar fasciitis    Tubular adenoma of colon     Past Surgical History:  Procedure Laterality Date   CARDIAC CATHETERIZATION N/A 01/10/2017   Procedure: Left Heart Cath and Cors/Grafts Angiography;  Surgeon: Kathleene Hazel, MD;  Location: [facility] INVASIVE CV LAB;  Service: Cardiovascular;  Laterality: N/A;   CORONARY ARTERY BYPASS GRAFT  LIMA to LAD, SVG to Intermediate, SVG to PDA   2005 cabg x 3    CYSTOSCOPY WITH LITHOLAPAXY N/A 07/06/2022   Procedure: CYSTOSCOPY WITH LITHOLAPAXY;  Surgeon: Bjorn Pippin, MD;  Location: [facility];  Service: Urology;  Laterality: N/A;   Hernia surgery x 2     TOTAL HIP ARTHROPLASTY Left 01/27/2013   Procedure: TOTAL HIP ARTHROPLASTY ANTERIOR APPROACH;  Surgeon: [name], MD;  Location: [facility];  Service: Orthopedics;  Laterality: Left;   TOTAL HIP ARTHROPLASTY Right 04/24/2022   Procedure: TOTAL HIP ARTHROPLASTY ANTERIOR APPROACH;  Surgeon: Durene Romans, MD;  Location: [facility];  Service: Orthopedics;  Laterality: Right;   TOTAL KNEE ARTHROPLASTY Left 02/21/2021   Procedure: TOTAL KNEE ARTHROPLASTY;  Surgeon: Durene Romans, MD;  Location: [facility];  Service: Orthopedics;  Laterality: Left;  70 mins   TRANSURETHRAL RESECTION OF PROSTATE N/A 07/06/2022   Procedure: TRANSURETHRAL RESECTION OF THE PROSTATE (TURP);  Surgeon: Bjorn Pippin, MD;  Location: [facility];  Service: Urology;  Laterality: N/A;  1 HR FOR CASE    Current Outpatient Medications  Medication Sig Dispense Refill   allopurinol (ZYLOPRIM) 300 MG tablet Take 300 mg by mouth daily.     aspirin EC 81 MG tablet Take 1 tablet (81 mg total) by mouth at bedtime. Recommend holding this until all the blood in your urine has cleared. 30 tablet 12   Cholecalciferol (VITAMIN D3) 1000 units CAPS Take 1,000 Units by mouth daily.     ezetimibe (ZETIA) 10  MG tablet Take 10 mg by mouth every morning.     levothyroxine (SYNTHROID) 75 MCG tablet Take 75 mcg by mouth daily before breakfast.     metoprolol (LOPRESSOR) 50 MG tablet Take 25 mg by mouth 2 (two) times daily.      nitroGLYCERIN (NITROSTAT) 0.4 MG SL tablet Place 0.4 mg under the tongue every 5 (five) minutes as needed for chest pain.     Omega-3 Fatty Acids (FISH OIL) 1000 MG CAPS Take 1,000 mg by mouth in the morning and at bedtime.     omeprazole (PRILOSEC) 20 MG capsule Take 20 mg by mouth every Monday, Wednesday, and Friday.     rosuvastatin (CRESTOR) 10 MG  tablet Take 10 mg by mouth every morning.     vitamin B-12 (CYANOCOBALAMIN) 1000 MCG tablet Take 1,000 mcg by mouth daily.     No current facility-administered medications for this visit.    No Known Allergies  Social History   Socioeconomic History   Marital status: Married    Spouse name: Not on file   Number of children: Not on file   Years of education: Not on file   Highest education level: Not on file  Occupational History   Occupation: Retired-banker  Tobacco Use   Smoking status: Never   Smokeless tobacco: Never  Vaping Use   Vaping status: Never Used  Substance and Sexual Activity   Alcohol use: Yes    Comment: social   Drug use: No   Sexual activity: Not on file  Other Topics Concern   Not on file  Social History Narrative    Positive for coronary disease in both his father as     well as a stroke in his mother.         Social Determinants of Health   Financial Resource Strain: Not on file  Food Insecurity: Not on file  Transportation Needs: Not on file  Physical Activity: Not on file  Stress: Not on file  Social Connections: Not on file  Intimate Partner Violence: Not on file    Family History  Problem Relation Age of Onset   Heart attack Father    Coronary artery disease Other     Review of Systems:  As stated in the HPI and otherwise negative.   There were no vitals taken for this visit.  Physical Examination: General: Well developed, well nourished, NAD  HEENT: OP clear, mucus membranes moist  SKIN: warm, dry. No rashes. Neuro: No focal deficits  Musculoskeletal: Muscle strength 5/5 all ext  Psychiatric: Mood and affect normal  Neck: No JVD, no carotid bruits, no thyromegaly, no lymphadenopathy.  Lungs:Clear bilaterally, no wheezes, rhonci, crackles Cardiovascular: Regular rate and rhythm. No murmurs, gallops or rubs. Abdomen:Soft. Bowel sounds present. Non-tender.  Extremities: No lower extremity edema. Pulses are 2 + in the bilateral  DP/PT.  EKG:  EKG is not *** ordered today.  The ekg ordered today demonstrates   Recent Labs: No results found for requested labs within last 365 days.   Lipid Panel No results found for: "CHOL", "TRIG", "HDL", "CHOLHDL", "VLDL", "LDLCALC", "LDLDIRECT"   Wt Readings from Last 3 Encounters:  11/05/22 79.4 kg  07/06/22 75.9 kg  06/26/22 75.9 kg    Assessment and Plan:   1. CAD s/p CABG without angina: No chest pain. Cardiac cath February 2018 with three vessel CAD with 3/3 patent bypass grafts. Continue ASA, statin and beta blocker.   2. Hyperlipidemia: Lipids followed in primary care. LDL ***.  Continue statin. .   3. Mitral regurgitation: Echo November 2024 with mild to moderate MR.  Labs/ tests ordered today include:  No orders of the defined types were placed in this encounter.  Disposition:   F/U with me in 12 months   Signed, Verne Carrow, MD 11/19/2023 2:14 PM    [facility] [address] [place], [place], [place]  [address] Phone: [phone]; Fax: [phone]

## 2023-11-20 ENCOUNTER — Encounter: Payer: Self-pay | Admitting: Cardiovascular Disease

## 2023-11-20 ENCOUNTER — Ambulatory Visit: Payer: Medicare HMO | Attending: Cardiovascular Disease | Admitting: Cardiovascular Disease

## 2023-11-20 VITALS — BP 134/72 | HR 52 | Ht 69.5 in | Wt 177.8 lb

## 2023-11-20 DIAGNOSIS — I2510 Atherosclerotic heart disease of native coronary artery without angina pectoris: Secondary | ICD-10-CM

## 2023-11-20 DIAGNOSIS — I340 Nonrheumatic mitral (valve) insufficiency: Secondary | ICD-10-CM

## 2023-11-20 DIAGNOSIS — E7800 Pure hypercholesterolemia, unspecified: Secondary | ICD-10-CM | POA: Diagnosis not present

## 2023-11-20 NOTE — Patient Instructions (Signed)
Medication Instructions:  No changes *If you need a refill on your cardiac medications before your next appointment, please call your pharmacy*   Lab Work: none If you have labs (blood work) drawn today and your tests are completely normal, you will receive your results only by: [place] (if you have MyChart) OR A paper copy in the mail If you have any lab test that is abnormal or we need to change your treatment, we will call you to review the results.   Testing/Procedures: none   Follow-Up: At [facility], you and your health needs are our priority.  As part of our continuing mission to provide you with exceptional heart care, we have created designated Provider Care Teams.  These Care Teams include your primary Cardiologist (physician) and Advanced Practice Providers (APPs -  Physician Assistants and Nurse Practitioners) who all work together to provide you with the care you need, when you need it.   Your next appointment:   12 month(s)  Provider:   Lauree Chandler, MD     Other Instructions

## 2024-01-10 DIAGNOSIS — H6121 Impacted cerumen, right ear: Secondary | ICD-10-CM | POA: Diagnosis not present

## 2024-01-16 DIAGNOSIS — D224 Melanocytic nevi of scalp and neck: Secondary | ICD-10-CM | POA: Diagnosis not present

## 2024-01-16 DIAGNOSIS — L853 Xerosis cutis: Secondary | ICD-10-CM | POA: Diagnosis not present

## 2024-01-16 DIAGNOSIS — L814 Other melanin hyperpigmentation: Secondary | ICD-10-CM | POA: Diagnosis not present

## 2024-01-16 DIAGNOSIS — L570 Actinic keratosis: Secondary | ICD-10-CM | POA: Diagnosis not present

## 2024-01-16 DIAGNOSIS — D485 Neoplasm of uncertain behavior of skin: Secondary | ICD-10-CM | POA: Diagnosis not present

## 2024-01-16 DIAGNOSIS — D225 Melanocytic nevi of trunk: Secondary | ICD-10-CM | POA: Diagnosis not present

## 2024-01-16 DIAGNOSIS — D2261 Melanocytic nevi of right upper limb, including shoulder: Secondary | ICD-10-CM | POA: Diagnosis not present

## 2024-01-16 DIAGNOSIS — Z85828 Personal history of other malignant neoplasm of skin: Secondary | ICD-10-CM | POA: Diagnosis not present

## 2024-01-16 DIAGNOSIS — L821 Other seborrheic keratosis: Secondary | ICD-10-CM | POA: Diagnosis not present

## 2024-01-16 DIAGNOSIS — L720 Epidermal cyst: Secondary | ICD-10-CM | POA: Diagnosis not present

## 2024-01-17 DIAGNOSIS — M1711 Unilateral primary osteoarthritis, right knee: Secondary | ICD-10-CM | POA: Diagnosis not present

## 2024-02-12 DIAGNOSIS — I129 Hypertensive chronic kidney disease with stage 1 through stage 4 chronic kidney disease, or unspecified chronic kidney disease: Secondary | ICD-10-CM | POA: Diagnosis not present

## 2024-02-12 DIAGNOSIS — N401 Enlarged prostate with lower urinary tract symptoms: Secondary | ICD-10-CM | POA: Diagnosis not present

## 2024-02-12 DIAGNOSIS — I2510 Atherosclerotic heart disease of native coronary artery without angina pectoris: Secondary | ICD-10-CM | POA: Diagnosis not present

## 2024-02-12 DIAGNOSIS — M109 Gout, unspecified: Secondary | ICD-10-CM | POA: Diagnosis not present

## 2024-02-12 DIAGNOSIS — E039 Hypothyroidism, unspecified: Secondary | ICD-10-CM | POA: Diagnosis not present

## 2024-02-12 DIAGNOSIS — N182 Chronic kidney disease, stage 2 (mild): Secondary | ICD-10-CM | POA: Diagnosis not present

## 2024-02-12 DIAGNOSIS — E785 Hyperlipidemia, unspecified: Secondary | ICD-10-CM | POA: Diagnosis not present

## 2024-02-19 DIAGNOSIS — M109 Gout, unspecified: Secondary | ICD-10-CM | POA: Diagnosis not present

## 2024-02-19 DIAGNOSIS — N183 Chronic kidney disease, stage 3 unspecified: Secondary | ICD-10-CM | POA: Diagnosis not present

## 2024-02-19 DIAGNOSIS — I059 Rheumatic mitral valve disease, unspecified: Secondary | ICD-10-CM | POA: Diagnosis not present

## 2024-02-19 DIAGNOSIS — R82998 Other abnormal findings in urine: Secondary | ICD-10-CM | POA: Diagnosis not present

## 2024-02-19 DIAGNOSIS — J302 Other seasonal allergic rhinitis: Secondary | ICD-10-CM | POA: Diagnosis not present

## 2024-02-19 DIAGNOSIS — Z1331 Encounter for screening for depression: Secondary | ICD-10-CM | POA: Diagnosis not present

## 2024-02-19 DIAGNOSIS — Z0000 Encounter for general adult medical examination without abnormal findings: Secondary | ICD-10-CM | POA: Diagnosis not present

## 2024-02-19 DIAGNOSIS — Z1339 Encounter for screening examination for other mental health and behavioral disorders: Secondary | ICD-10-CM | POA: Diagnosis not present

## 2024-02-19 DIAGNOSIS — E785 Hyperlipidemia, unspecified: Secondary | ICD-10-CM | POA: Diagnosis not present

## 2024-02-19 DIAGNOSIS — E039 Hypothyroidism, unspecified: Secondary | ICD-10-CM | POA: Diagnosis not present

## 2024-02-19 DIAGNOSIS — N401 Enlarged prostate with lower urinary tract symptoms: Secondary | ICD-10-CM | POA: Diagnosis not present

## 2024-02-19 DIAGNOSIS — I2510 Atherosclerotic heart disease of native coronary artery without angina pectoris: Secondary | ICD-10-CM | POA: Diagnosis not present

## 2024-02-19 DIAGNOSIS — M1990 Unspecified osteoarthritis, unspecified site: Secondary | ICD-10-CM | POA: Diagnosis not present

## 2024-02-19 DIAGNOSIS — I129 Hypertensive chronic kidney disease with stage 1 through stage 4 chronic kidney disease, or unspecified chronic kidney disease: Secondary | ICD-10-CM | POA: Diagnosis not present

## 2024-02-19 DIAGNOSIS — K219 Gastro-esophageal reflux disease without esophagitis: Secondary | ICD-10-CM | POA: Diagnosis not present

## 2024-04-01 IMAGING — MR MR HEAD WO/W CM
16 series · 48 of 48 positions shown · IV contrast (gadavist)
Comparison: None Available.

CLINICAL DATA: Transient alteration of awareness

EXAM:
MRI HEAD WITHOUT AND WITH CONTRAST
TECHNIQUE: Multiplanar, multiecho pulse sequences of the brain and surrounding
structures were obtained without and with intravenous contrast.
CONTRAST:  7mL GADAVIST GADOBUTROL 1 MMOL/ML IV SOLN

[Series 5: DWI · axial · 3.0mm · 1.36mm/px · z∈[-29,+118]mm · 5 of 104 slices shown (1 of 2)]
[im 1/104]
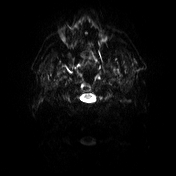
[im 26/104]
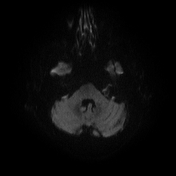
[im 52/104]
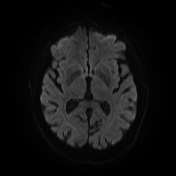
[im 78/104]
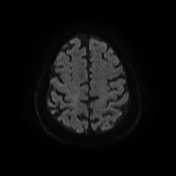
[im 104/104]
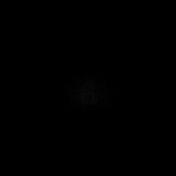

[Series 6: DWI · axial · 3.0mm · 1.36mm/px · z∈[-29,+118]mm · 3 of 52 slices shown (2 of 2)]
[im 1/52]
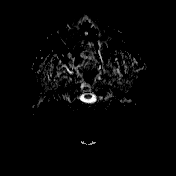
[im 26/52]
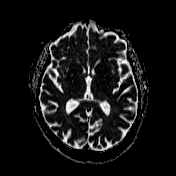
[im 52/52]
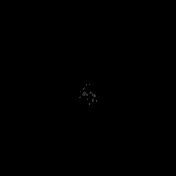

[Series 7: T1 · sagittal · 5.0mm · 0.75mm/px · 1 of 24 slices shown (1 of 4)]
[im 1/24]
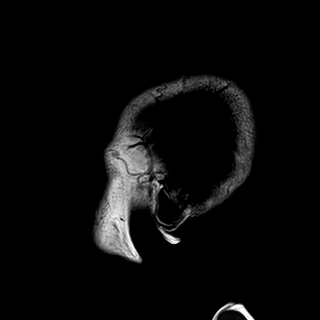

[Series 8: T2 · axial · 5.0mm · 0.62mm/px · 1 of 25 slices shown]
[im 1/25]
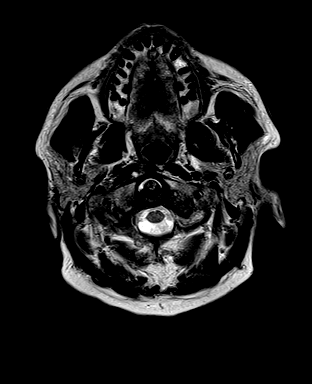

[Series 9: swi_images · axial · 3.0mm · 0.75mm/px · z∈[-38,+120]mm · 3 of 56 slices shown]
[im 1/56]
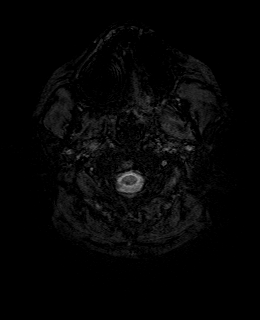
[im 28/56]
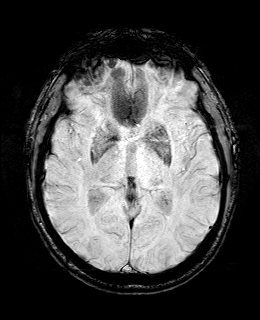
[im 56/56]
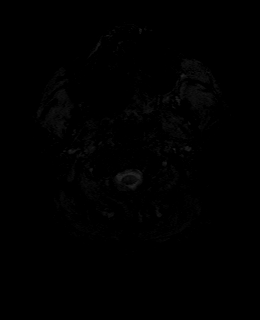

[Series 10: mip_images(sw) · axial · 24.0mm · 0.75mm/px · z∈[-28,+110]mm · 2 of 49 slices shown]
[im 1/49]
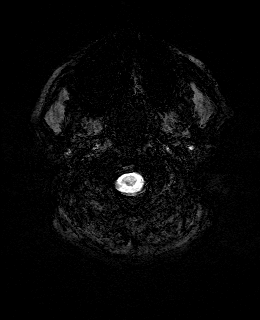
[im 49/49]
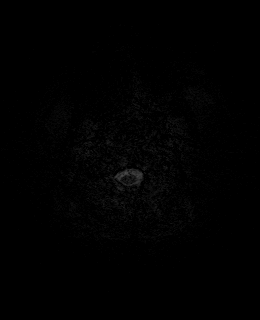

[Series 11: FLAIR · axial · 3.0mm · 0.75mm/px · z∈[-32,+114]mm · 3 of 52 slices shown]
[im 1/52]
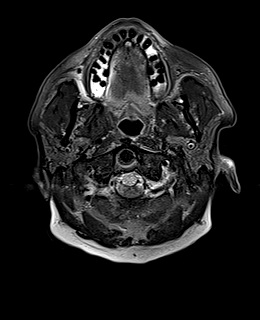
[im 26/52]
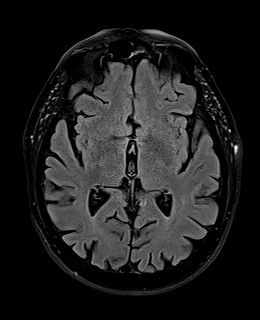
[im 52/52]
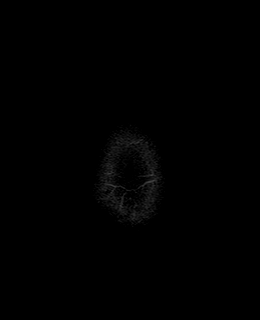

[Series 12: T1 · axial · 1.0mm · 0.94mm/px · z∈[-39,+129]mm · 9 of 176 slices shown (2 of 4)]
[im 1/176]
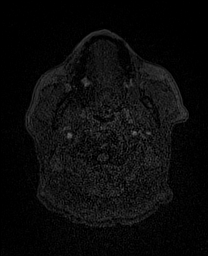
[im 22/176]
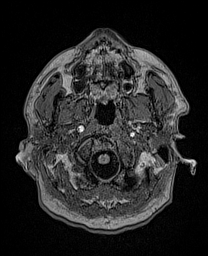
[im 44/176]
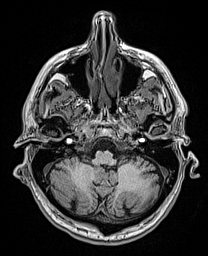
[im 66/176]
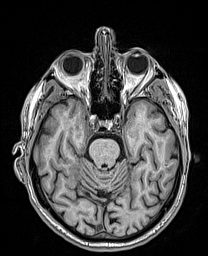
[im 88/176]
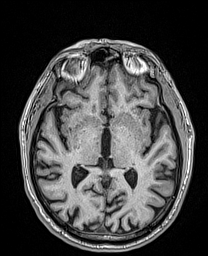
[im 110/176]
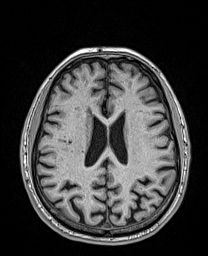
[im 132/176]
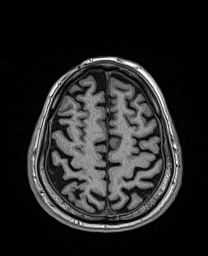
[im 154/176]
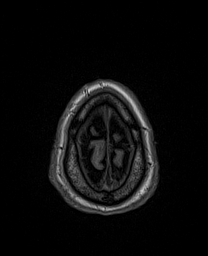
[im 176/176]
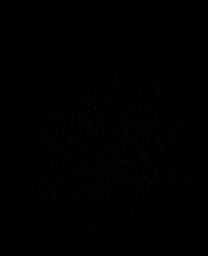

[Series 13: cor dwi_tracew · coronal · 5.0mm · 1.53mm/px · 3 of 62 slices shown]
[im 1/62]
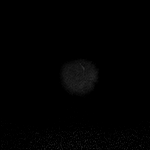
[im 31/62]
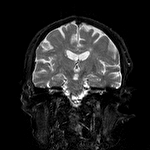
[im 62/62]
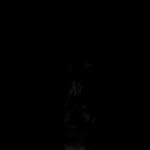

[Series 14: cor dwi_adc · coronal · 5.0mm · 1.53mm/px · 2 of 31 slices shown]
[im 1/31]
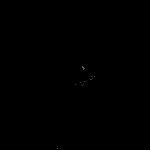
[im 31/31]
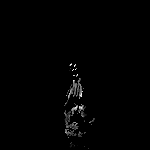

[Series 15: T2 post-contrast · coronal · 5.0mm · 0.57mm/px · 1 of 28 slices shown]
[im 1/28]
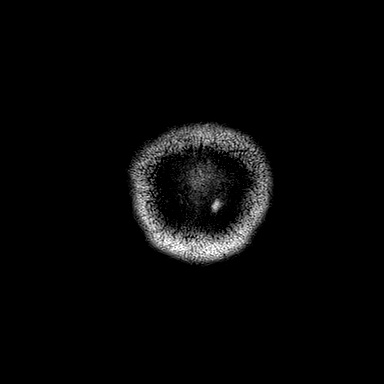

[Series 16: T1 post-contrast · axial · 1.0mm · 0.94mm/px · z∈[-39,+129]mm · 9 of 176 slices shown (1 of 3)]
[im 1/176]
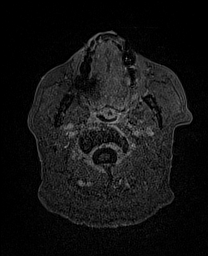
[im 22/176]
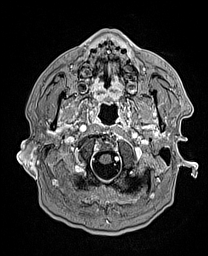
[im 44/176]
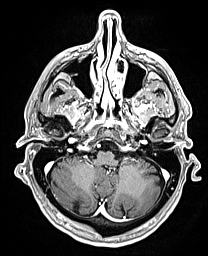
[im 66/176]
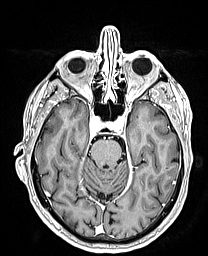
[im 88/176]
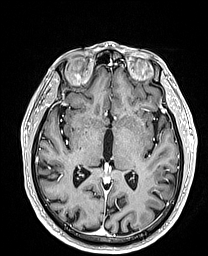
[im 110/176]
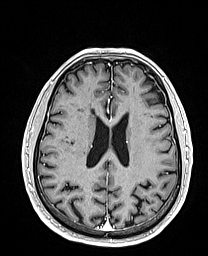
[im 132/176]
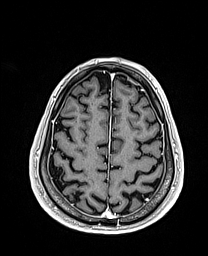
[im 154/176]
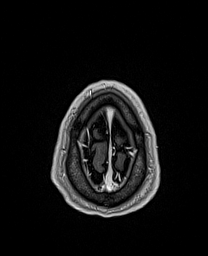
[im 176/176]
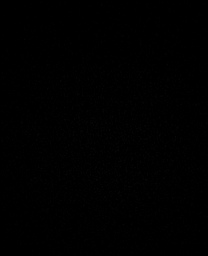

[Series 17: T1 · sagittal · 4.0mm · 0.94mm/px · 2 of 38 slices shown (3 of 4)]
[im 1/38]
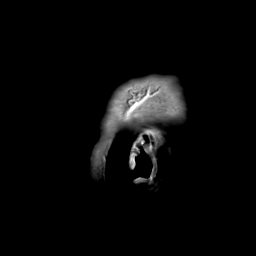
[im 38/38]
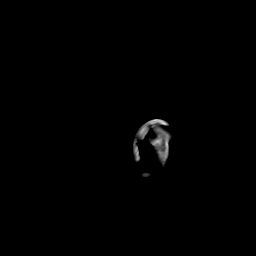

[Series 18: T1 · coronal · 4.0mm · 0.94mm/px · 2 of 42 slices shown (4 of 4)]
[im 1/42]
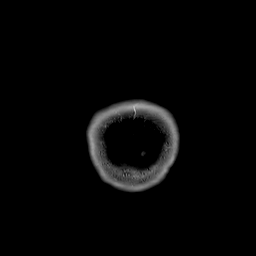
[im 42/42]
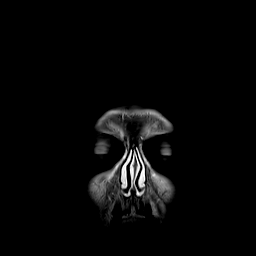

[Series 19: T1 post-contrast · coronal · 5.0mm · 0.43mm/px · 1 of 28 slices shown (2 of 3)]
[im 1/28]
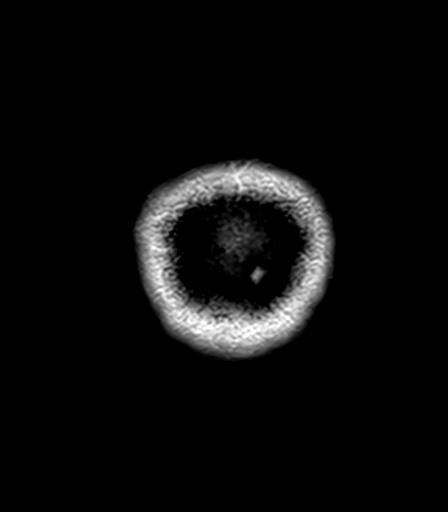

[Series 20: T1 post-contrast · sagittal · 5.0mm · 0.75mm/px · 1 of 24 slices shown (3 of 3)]
[im 1/24]
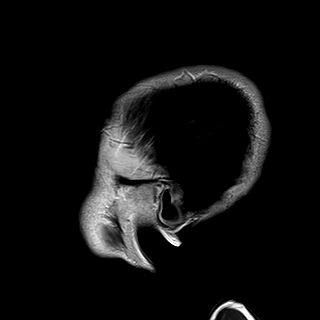

[48 of 48 positions shown; findings below may reference images not displayed]

FINDINGS: Brain: No restricted diffusion to suggest acute or subacute infarct.
No acute hemorrhage, mass, mass effect, or midline shift. No
hemosiderin deposition to suggest remote hemorrhage. No
hydrocephalus or extra-axial collection. No abnormal parenchymal or
meningeal enhancement. Degree of cerebral volume loss is within
normal limits for age. Remote small right parietal cortical infarct.
Scattered T2 hyperintense signal in the periventricular white
matter, likely the sequela of mild chronic small vessel ischemic
disease.

Vascular: Normal arterial flow voids with a diminutive right
vertebral artery in a left dominant system. Normal vascular
enhancement. The venous sinuses are patent on postcontrast imaging.

Skull and upper cervical spine: Normal marrow signal.

Sinuses/Orbits: Mild mucosal thickening in the maxillary sinuses.
The orbits are unremarkable.

Other: The mastoids are well aerated.
IMPRESSION: No acute intracranial process. No evidence of acute or subacute
infarct.

## 2024-08-27 DIAGNOSIS — L821 Other seborrheic keratosis: Secondary | ICD-10-CM | POA: Diagnosis not present

## 2024-08-27 DIAGNOSIS — C44622 Squamous cell carcinoma of skin of right upper limb, including shoulder: Secondary | ICD-10-CM | POA: Diagnosis not present

## 2024-08-27 DIAGNOSIS — D485 Neoplasm of uncertain behavior of skin: Secondary | ICD-10-CM | POA: Diagnosis not present

## 2024-08-27 DIAGNOSIS — L570 Actinic keratosis: Secondary | ICD-10-CM | POA: Diagnosis not present

## 2024-08-27 DIAGNOSIS — Z85828 Personal history of other malignant neoplasm of skin: Secondary | ICD-10-CM | POA: Diagnosis not present

## 2024-08-27 DIAGNOSIS — S1086XA Insect bite of other specified part of neck, initial encounter: Secondary | ICD-10-CM | POA: Diagnosis not present

## 2024-10-05 DIAGNOSIS — M25561 Pain in right knee: Secondary | ICD-10-CM | POA: Diagnosis not present

## 2024-10-05 DIAGNOSIS — M1711 Unilateral primary osteoarthritis, right knee: Secondary | ICD-10-CM | POA: Diagnosis not present
